# Patient Record
Sex: Male | Born: 1955 | Race: Black or African American | Hispanic: No | Smoking: Never smoker
Health system: Southern US, Community
[De-identification: ages and names within clinical notes are randomized; demographics above are authoritative.]

## PROBLEM LIST (undated history)

## (undated) DIAGNOSIS — N184 Chronic kidney disease, stage 4 (severe): Secondary | ICD-10-CM

## (undated) DIAGNOSIS — I499 Cardiac arrhythmia, unspecified: Secondary | ICD-10-CM

## (undated) DIAGNOSIS — R0989 Other specified symptoms and signs involving the circulatory and respiratory systems: Secondary | ICD-10-CM

## (undated) DIAGNOSIS — I10 Essential (primary) hypertension: Secondary | ICD-10-CM

## (undated) DIAGNOSIS — I517 Cardiomegaly: Secondary | ICD-10-CM

## (undated) DIAGNOSIS — N189 Chronic kidney disease, unspecified: Secondary | ICD-10-CM

## (undated) DIAGNOSIS — N289 Disorder of kidney and ureter, unspecified: Secondary | ICD-10-CM

## (undated) DIAGNOSIS — I110 Hypertensive heart disease with heart failure: Secondary | ICD-10-CM

## (undated) DIAGNOSIS — C801 Malignant (primary) neoplasm, unspecified: Secondary | ICD-10-CM

## (undated) DIAGNOSIS — D649 Anemia, unspecified: Secondary | ICD-10-CM

## (undated) DIAGNOSIS — I4729 Other ventricular tachycardia: Secondary | ICD-10-CM

## (undated) HISTORY — DX: Other ventricular tachycardia: I47.29

## (undated) HISTORY — PX: PROSTATE BIOPSY: SHX241

## (undated) HISTORY — DX: Hypertensive heart disease with heart failure: I11.0

## (undated) HISTORY — DX: Chronic kidney disease, stage 4 (severe): N18.4

## (undated) HISTORY — DX: Other specified symptoms and signs involving the circulatory and respiratory systems: R09.89

## (undated) HISTORY — DX: Cardiomegaly: I51.7

## (undated) HISTORY — DX: Essential (primary) hypertension: I10

## (undated) HISTORY — DX: Anemia, unspecified: D64.9

---

## 1991-07-03 DIAGNOSIS — J189 Pneumonia, unspecified organism: Secondary | ICD-10-CM

## 1991-07-03 HISTORY — DX: Pneumonia, unspecified organism: J18.9

## 1991-07-03 HISTORY — PX: OTHER SURGICAL HISTORY: SHX169

## 1997-12-24 ENCOUNTER — Ambulatory Visit (HOSPITAL_COMMUNITY): Admission: RE | Admit: 1997-12-24 | Discharge: 1997-12-24 | Payer: Self-pay | Admitting: Family Medicine

## 1998-02-21 ENCOUNTER — Ambulatory Visit (HOSPITAL_COMMUNITY): Admission: RE | Admit: 1998-02-21 | Discharge: 1998-02-21 | Payer: Self-pay | Admitting: Family Medicine

## 1998-10-05 ENCOUNTER — Ambulatory Visit (HOSPITAL_COMMUNITY): Admission: RE | Admit: 1998-10-05 | Discharge: 1998-10-05 | Payer: Self-pay | Admitting: Oncology

## 1999-12-04 ENCOUNTER — Encounter: Admission: RE | Admit: 1999-12-04 | Discharge: 1999-12-04 | Payer: Self-pay

## 2000-10-21 ENCOUNTER — Encounter: Payer: Self-pay | Admitting: *Deleted

## 2000-10-21 ENCOUNTER — Ambulatory Visit (HOSPITAL_COMMUNITY): Admission: RE | Admit: 2000-10-21 | Discharge: 2000-10-21 | Payer: Self-pay | Admitting: *Deleted

## 2000-12-16 ENCOUNTER — Ambulatory Visit (HOSPITAL_COMMUNITY): Admission: RE | Admit: 2000-12-16 | Discharge: 2000-12-16 | Payer: Self-pay | Admitting: Family Medicine

## 2000-12-16 ENCOUNTER — Encounter: Payer: Self-pay | Admitting: Family Medicine

## 2002-04-01 ENCOUNTER — Encounter: Payer: Self-pay | Admitting: Family Medicine

## 2002-04-01 ENCOUNTER — Ambulatory Visit (HOSPITAL_COMMUNITY): Admission: RE | Admit: 2002-04-01 | Discharge: 2002-04-01 | Payer: Self-pay | Admitting: Family Medicine

## 2002-04-16 ENCOUNTER — Ambulatory Visit (HOSPITAL_COMMUNITY): Admission: RE | Admit: 2002-04-16 | Discharge: 2002-04-16 | Payer: Self-pay | Admitting: Family Medicine

## 2002-04-16 ENCOUNTER — Encounter: Payer: Self-pay | Admitting: Family Medicine

## 2002-06-19 ENCOUNTER — Ambulatory Visit (HOSPITAL_COMMUNITY): Admission: RE | Admit: 2002-06-19 | Discharge: 2002-06-19 | Payer: Self-pay | Admitting: Family Medicine

## 2002-06-19 ENCOUNTER — Encounter: Payer: Self-pay | Admitting: Family Medicine

## 2003-05-03 ENCOUNTER — Ambulatory Visit (HOSPITAL_COMMUNITY): Admission: RE | Admit: 2003-05-03 | Discharge: 2003-05-03 | Payer: Self-pay | Admitting: Cardiovascular Disease

## 2003-05-21 ENCOUNTER — Ambulatory Visit (HOSPITAL_COMMUNITY): Admission: RE | Admit: 2003-05-21 | Discharge: 2003-05-21 | Payer: Self-pay | Admitting: Gastroenterology

## 2004-02-17 ENCOUNTER — Ambulatory Visit (HOSPITAL_COMMUNITY): Admission: RE | Admit: 2004-02-17 | Discharge: 2004-02-17 | Payer: Self-pay | Admitting: Gastroenterology

## 2005-10-23 ENCOUNTER — Encounter: Payer: Self-pay | Admitting: Cardiovascular Disease

## 2008-01-16 ENCOUNTER — Encounter: Payer: Self-pay | Admitting: Cardiovascular Disease

## 2008-01-23 ENCOUNTER — Inpatient Hospital Stay (HOSPITAL_BASED_OUTPATIENT_CLINIC_OR_DEPARTMENT_OTHER): Admission: RE | Admit: 2008-01-23 | Discharge: 2008-01-23 | Payer: Self-pay | Admitting: Cardiovascular Disease

## 2009-04-11 ENCOUNTER — Encounter: Admission: RE | Admit: 2009-04-11 | Discharge: 2009-04-11 | Payer: Self-pay | Admitting: Family Medicine

## 2010-11-14 NOTE — Cardiovascular Report (Signed)
NAMEINDIANA, PECHACEK                ACCOUNT NO.:  [id number]   MEDICAL RECORD NO.:  [id number]          PATIENT TYPE:  OIB   LOCATION:  1962                         FACILITY:  [facility]   PHYSICIAN:  Vesta Mixer, M.D. DATE OF BIRTH:  12/30/1955   DATE OF PROCEDURE:  01/23/2008  DATE OF DISCHARGE:                            CARDIAC CATHETERIZATION   Terry Moss is a 55 year old gentleman with a long history of  hypertension.  He has had history of nonsustained ventricular  tachycardia.  He had a stress Cardiolite study recently, which revealed  a lot of ventricular ectopy and revealed moderate left ventricular  dysfunction.  He is referred for heart catheterization for further  evaluation.   The procedure was left heart catheterization with coronary angiography.   The right femoral artery was easily cannulated using a modified  Seldinger technique.   HEMODYNAMIC:  LV pressure is 115/75 with an aortic pressure of 109/67.   Angiography left main.  The left main is smooth and normal.   The left anterior descending artery is smooth and normal.  The diagonals  are fairly normal.   The left circumflex artery is normal in its proximal segment.  There is  a mid 10-20% stenosis just after giving off the first obtuse marginal  artery.  The remainder of the left circumflex artery is normal.  The  first obtuse marginal artery is moderate in size and is normal.   The right coronary artery is moderate to large in size and is dominant.  There is a 20-30% stenosis in the proximal segment.  The remainder of  the RCA is unremarkable.  The posterior descending artery and  posterolateral segment artery are unremarkable.   The left ventriculogram was performed in the 30 RAO position.  It  reveals moderately enlarged left ventricle.  There is mild-to-moderate  left ventricular dysfunction.  The apical segments seem to contract  normally, but the base does not contract quite as vigorously.  The  ejection fraction is approximately 50%.   COMPLICATIONS:  None.   CONCLUSION:  1. Minimal coronary artery irregularities.  2. Mild left ventricular dysfunction with moderate left ventricular      enlargement.  We will continue with the medical therapy.  He may      need ACE inhibitors for further improvement.  His heart rates are      already fairly slow, and I do not think that we can add a beta-      blocker.           ______________________________  Vesta Mixer, M.D.     PJN/MEDQ  D:  01/23/2008  T:  01/24/2008  Job:  [id number]   cc:   Holley Bouche, M.D.

## 2010-11-14 NOTE — H&P (Signed)
NAMEGENTLE, HOGE                ACCOUNT NO.:  [id number]   MEDICAL RECORD NO.:  [id number]         PATIENT TYPE:  JCAR   LOCATION:                               FACILITY:  [facility]   PHYSICIAN:  Vesta Mixer, M.D. DATE OF BIRTH:  December 23, 1955   DATE OF ADMISSION:  01/23/2008  DATE OF DISCHARGE:                              HISTORY & PHYSICAL   The patient is a middle-aged gentleman with a history of hypertension,  palpitations, and nonsustained ventricular tachycardia.  He is admitted  to the hospital for elective heart catheterization after having an  abnormal Cardiolite study.   The patient has been followed for many years for mildly dilated left  ventricle.  He has also had some nonsustained ventricular tachycardia.  He has had numerous stress test.  All of which have been negative.   He was seen last week for a stress test.  He was found to have numerous  premature atrial contractions and some ventricular arrhythmias.  The  stress Cardiolite study revealed that he did have good exercise  capacity.  He had moderately enlarged left ventricle with an ejection  fraction of 40%.  Because of this sonographic abnormalities, he is  scheduled for heart catheterization.  The patient has noticed some  shortness of breath, especially with exertion.  This has occurred more  often recently.  He has never had any episodes of chest pain.  He is  still able to exercise fairly well but has noticed a distinct difference  in his abilities.   He denies any syncope or presyncope.  He denies any PND or orthopnea.   His current medications are:  1. Maxzide 37.5 mg/12.5 mg once a day.  2. Anti-aging pill once a day.   ALLERGIES:  None.   PAST MEDICAL HISTORY:  1. History of mild cardiomegaly.  2. History of hypertension.   SOCIAL HISTORY:  The patient used to smoke but quit 20 years ago.  He  does not drink alcohol.   His family history is unremarkable.   His review of systems is reviewed  and is essentially negative except for  as noted in the HPI.   On exam, he is a middle-aged gentleman in no acute distress.  He is  alert and oriented x3 and his mood and affect are normal.  His weight is  172, blood pressure 150/80 with a heart rate of 60.  HEENT exam reveals  2+ carotids.  He has no bruits, no JVD, and no thyromegaly.  Lungs are  clear to auscultation.  Heart has regular rate, S1 and S2.  Abdominal  exam reveals good bowel sounds and is nontender.  Extremities, he has no  clubbing, cyanosis, or edema.   The patient presents with some cardiomegaly and some mild congestive  heart failure.  He had lots of complex arrhythmias, although no real  consecutive ventricular beats, although it was concerning.  I would like  to scheduled him for heart catheterization.  We have discussed the  risks, benefits, and options of heart catheterization.  He understands  and agrees to proceed.  We have scheduled this for Friday, January 23, 2008.           ______________________________  Vesta Mixer, M.D.     PJN/MEDQ  D:  01/19/2008  T:  01/20/2008  Job:  [id number]   cc:   Holley Bouche, M.D.

## 2010-11-17 NOTE — Op Note (Signed)
NAMESUHAIL, PELOQUIN                          ACCOUNT NO.:  [id number]   MEDICAL RECORD NO.:  [id number]                   PATIENT TYPE:  AMB   LOCATION:  ENDO                                 FACILITY:  [facility]   PHYSICIAN:  Graylin Shiver, M.D.                DATE OF BIRTH:  1956-05-06   DATE OF PROCEDURE:  02/17/2004  DATE OF DISCHARGE:                                 OPERATIVE REPORT   PROCEDURE:  Colonoscopy.   INDICATIONS FOR PROCEDURE:  Family history of colon cancer in the patient's  brother.  Informed consent was obtained after explanation of the risks of  bleeding, infection, and perforation.   PREMEDICATION:  Fentanyl 60 mcg IV, Versed 6 mg IV.   PROCEDURE IN DETAIL:  With the patient in the left lateral decubitus  position, a rectal exam was performed and no masses were felt.  The Olympus  colonoscope was inserted into the rectum and advanced around the colon to  the cecum.  Cecal landmarks were identified.  The cecum  and ascending colon  were normal  The transverse colon was normal.  The descending colon,  sigmoid, and rectum were normal.  He tolerated the procedure well without  complications.   IMPRESSION:  Normal colonoscopy to the cecum.   PLAN:  In view of the family history of colon cancer, I would recommend a  follow up colonoscopy again in five years.                                               Graylin Shiver, M.D.    Germain Osgood  D:  02/17/2004  T:  02/17/2004  Job:  [id number]   cc:   Melida Quitter, M.D.  [address]  [place]  [place] [address]  Fax: [phone]

## 2011-12-08 ENCOUNTER — Encounter: Payer: Self-pay | Admitting: *Deleted

## 2012-05-06 DIAGNOSIS — I10 Essential (primary) hypertension: Secondary | ICD-10-CM | POA: Insufficient documentation

## 2014-04-22 ENCOUNTER — Telehealth: Payer: Self-pay | Admitting: Cardiovascular Disease

## 2014-04-22 NOTE — Telephone Encounter (Signed)
Walk in pt Form " [employer]" paper Dropped Off gave to [facility]

## 2014-04-23 ENCOUNTER — Telehealth: Payer: Self-pay | Admitting: Cardiovascular Disease

## 2014-04-23 NOTE — Telephone Encounter (Signed)
New message     Need to talk to a nurse regarding documents needing to be faxed to his job

## 2014-04-23 NOTE — Telephone Encounter (Signed)
Follow up          [phone] is the fax number to send the papers

## 2014-04-23 NOTE — Telephone Encounter (Signed)
Left message to call back  

## 2014-04-23 NOTE — Telephone Encounter (Signed)
Apparently already dropped off a form to [employer] for Dr. Elmarie Shiley signature

## 2014-04-26 NOTE — Telephone Encounter (Signed)
Spoke with patient and advised him that per Dr. Acie Fredrickson, cannot complete paperwork for his employer until seen in the office.  Patient scheduled for tomorrow at 11:45.  Patient verbalized understanding and agreement.

## 2014-04-27 ENCOUNTER — Encounter: Payer: Self-pay | Admitting: Cardiovascular Disease

## 2014-04-27 ENCOUNTER — Ambulatory Visit (INDEPENDENT_AMBULATORY_CARE_PROVIDER_SITE_OTHER): Payer: BC Managed Care – PPO | Admitting: Cardiovascular Disease

## 2014-04-27 VITALS — BP 130/70 | HR 49 | Ht 72.0 in | Wt 155.8 lb

## 2014-04-27 DIAGNOSIS — Z13220 Encounter for screening for lipoid disorders: Secondary | ICD-10-CM

## 2014-04-27 DIAGNOSIS — I5022 Chronic systolic (congestive) heart failure: Secondary | ICD-10-CM | POA: Insufficient documentation

## 2014-04-27 DIAGNOSIS — I38 Endocarditis, valve unspecified: Secondary | ICD-10-CM | POA: Insufficient documentation

## 2014-04-27 DIAGNOSIS — I493 Ventricular premature depolarization: Secondary | ICD-10-CM

## 2014-04-27 MED ORDER — POTASSIUM CHLORIDE ER 10 MEQ PO TBCR
10.0000 meq | EXTENDED_RELEASE_TABLET | Freq: Every day | ORAL | Status: DC
Start: 2014-04-27 — End: 2015-06-01

## 2014-04-27 MED ORDER — HYDROCHLOROTHIAZIDE 12.5 MG PO CAPS: 12.5000 mg | ORAL_CAPSULE | Freq: Every day | ORAL | Status: DC

## 2014-04-27 NOTE — Patient Instructions (Signed)
Your physician has recommended you make the following change in your medication:  START Hydrochlorothiazide 12.5 mg once daily START Kdur (potassium supplement) 10 meq once daily  Your physician recommends that you have lab work:  TODAY (BMET, lipids, liver, TSH)  Your physician recommends that you return for lab work in: 3 weeks to recheck kidney function (BMET)  Your physician has requested that you have an echocardiogram. Echocardiography is a painless test that uses sound waves to create images of your heart. It provides your doctor with information about the size and shape of your heart and how well your heart's chambers and valves are working. This procedure takes approximately one hour. There are no restrictions for this procedure.  Your physician wants you to follow-up in: 1 year with Dr. Acie Fredrickson.  You will receive a reminder letter in the mail two months in advance. If you don't receive a letter, please call our office to schedule the follow-up appointment.

## 2014-04-27 NOTE — Progress Notes (Signed)
     Terry Moss Date of Birth  09-Aug-1955       [facility] Office [address], [address] [place], [place], [place]  [address]   [place], [place]  [address] [address]   Fax  [phone]     Fax [phone]  Problem List: 1. Hypertension 2. Nonsustained ventricular tachycardia 3. Chronic systolic congestive heart failure-ejection fraction of 40% by stress Myoview study in 2009  History of Present Illness:  The patient is a middle-aged gentleman with a history of hypertension,  palpitations, and nonsustained ventricular tachycardia.   The patient has been followed for many years for mildly dilated left  ventricle. He has also had some nonsustained ventricular tachycardia.  He has had numerous stress test. All of which have been negative.  Oct. 27, 2015:  Terry Moss is seen today after ~6 year absence.  He was followed for years for mild systolic CHF, and NSVT.  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 No smptoms. Had several stress tests for years from 2000 to 2009 - all of which were negative.  He exercises regularly without any difficutly.  He denies any syncope or pre-syncope.    Current Outpatient Prescriptions on File Prior to Visit  Medication Sig Dispense Refill  . losartan (COZAAR) 50 MG tablet Take 100 mg by mouth daily.        No current facility-administered medications on file prior to visit.    No Known Allergies  Past Medical History  Diagnosis Date  . Anemia   . Sinus complaint   . Hypertension   . Cardiomegaly     Past Surgical History  Procedure Laterality Date  . Pleurisy  1995    Right lung surgery     History  Smoking status  . Never Smoker   Smokeless tobacco  . Not on file    History  Alcohol Use No    Family History  Problem Relation Age of Onset  . Heart disease Father     deceased  . Hypertension Mother     deceased  . Heart  disease Brother   . Heart disease Sister     x 2 sister  . Heart attack Brother     Reviw of Systems:  Reviewed in the HPI.  All other systems are negative.  Physical Exam: Blood pressure 140/90, pulse 49, height 6' (1.829 m), weight 155 lb 12.8 oz (70.67 kg). Wt Readings from Last 3 Encounters:  04/27/14 155 lb 12.8 oz (70.67 kg)     General: Well developed, well nourished, in no acute distress.  Head: Normocephalic, atraumatic, sclera non-icteric, mucus membranes are moist,   Neck: Supple. Carotids are 2 + without bruits. No JVD   Lungs: Clear   Heart: RR with frequent premature beats   Abdomen: Soft, non-tender, non-distended with normal bowel sounds.  Msk:  Strength and tone are normal   Extremities: No clubbing or cyanosis. No edema.  Distal pedal pulses are 2+ and equal    Neuro: CN II - XII intact.  Alert and oriented X 3.   Psych:  Normal   ECG: Oct. 27,2015:  Sinus brady at 49.  Occasional PVCs.  Voltage for LVH.   Assessment / Plan:

## 2014-04-27 NOTE — Assessment & Plan Note (Addendum)
Terry Moss has a hx of chronic systolic congestive heart failure. He is asymptomatic. LV is mildly dilated and he has had an LVEF of 40% for years. I've seen him in 5-6 years but he's had all these findings for many years.  He's completely asymptomatic. He works out on the right basis. He plays basketball in the gym  for hours without symptoms.   We'll get another echocardiogram for reevaluation of his LV function. We will forward this note,   and the echocardiogram as well as some laboratory data to:  [facility] and [place] [place], [place]  [address] Phone [phone] Fax [phone]

## 2014-04-27 NOTE — Assessment & Plan Note (Signed)
He has PVCs.  He has had these for years. Will recheck an echo and labwork including BMP, TSH , liver enzymes, lipids.  Will see him in 1 year.

## 2014-04-28 ENCOUNTER — Telehealth: Payer: Self-pay | Admitting: Cardiovascular Disease

## 2014-04-28 NOTE — Telephone Encounter (Signed)
Advised patient that fax was sent and confirmation received

## 2014-04-28 NOTE — Telephone Encounter (Signed)
New message      Pt saw Dr Acie Fredrickson yesterday.  [employer] did not get the fax from [place] in order for the pt to return to work.  Please refax it asap so that pt can return to work.

## 2014-04-29 ENCOUNTER — Telehealth: Payer: Self-pay | Admitting: Cardiovascular Disease

## 2014-04-29 ENCOUNTER — Encounter: Payer: Self-pay | Admitting: Nurse Practitioner

## 2014-04-29 NOTE — Telephone Encounter (Signed)
New message     Calling to let the nurse know he needs a note from Dr Acie Fredrickson stating he can work----this is for [employer].  He is coming to see you.

## 2014-04-29 NOTE — Telephone Encounter (Signed)
Patient was given letter in person in the office.

## 2014-05-17 ENCOUNTER — Other Ambulatory Visit (HOSPITAL_COMMUNITY): Payer: BC Managed Care – PPO

## 2014-05-17 ENCOUNTER — Other Ambulatory Visit (INDEPENDENT_AMBULATORY_CARE_PROVIDER_SITE_OTHER): Payer: BC Managed Care – PPO | Admitting: *Deleted

## 2014-05-17 ENCOUNTER — Other Ambulatory Visit: Payer: BC Managed Care – PPO

## 2014-05-17 ENCOUNTER — Ambulatory Visit (HOSPITAL_COMMUNITY): Payer: BC Managed Care – PPO | Attending: Cardiology

## 2014-05-17 DIAGNOSIS — I453 Trifascicular block: Secondary | ICD-10-CM | POA: Insufficient documentation

## 2014-05-17 DIAGNOSIS — I10 Essential (primary) hypertension: Secondary | ICD-10-CM | POA: Insufficient documentation

## 2014-05-17 DIAGNOSIS — I493 Ventricular premature depolarization: Secondary | ICD-10-CM

## 2014-05-17 DIAGNOSIS — I5022 Chronic systolic (congestive) heart failure: Secondary | ICD-10-CM

## 2014-05-17 LAB — BASIC METABOLIC PANEL
BUN: 29 mg/dL — ABNORMAL HIGH (ref 6–23)
CALCIUM: 9.3 mg/dL (ref 8.4–10.5)
CHLORIDE: 103 meq/L (ref 96–112)
CO2: 30 meq/L (ref 19–32)
CREATININE: 1.6 mg/dL — AB (ref 0.4–1.5)
GFR: 56.21 mL/min — AB (ref >60.00)
Glucose, Bld: 70 mg/dL (ref 70–99)
Potassium: 4.7 mEq/L (ref 3.5–5.1)
SODIUM: 139 meq/L (ref 135–145)

## 2014-05-17 NOTE — Progress Notes (Signed)
2D Echo completed. 05/17/2014

## 2014-05-18 ENCOUNTER — Telehealth: Payer: Self-pay | Admitting: Cardiovascular Disease

## 2014-05-18 MED ORDER — HYDRALAZINE HCL 25 MG PO TABS: 25.0000 mg | ORAL_TABLET | Freq: Three times a day (TID) | ORAL | Status: DC

## 2014-05-18 MED ORDER — ISOSORBIDE DINITRATE 20 MG PO TABS: 20.0000 mg | ORAL_TABLET | Freq: Three times a day (TID) | ORAL | Status: DC

## 2014-05-18 NOTE — Telephone Encounter (Signed)
Spoke with patient earlier today and reviewed echo and lab results and plan of care to start Isordil dinitrate 20 mg TID and Hydralazine 25 mg TID for heart function and to follow-up in 1 month.  Patient verbalized understanding and agreement.  Rx sent and appointment scheduled and I advised patient to call back with questions or concerns

## 2014-05-18 NOTE — Telephone Encounter (Signed)
Pt rtn call to michelle, pls call [phone]

## 2014-05-20 ENCOUNTER — Telehealth: Payer: Self-pay | Admitting: Cardiovascular Disease

## 2014-05-20 NOTE — Telephone Encounter (Signed)
Called patient back. He wanted to know if he should start both the Isordil and Hydralazine at one half a tab  TID for 2 weeks. I advised that this is correct. He is complaining of some facial flushing and headache. Advised him to take some Tylenol PRN and let us know if he is tolerating the medications. Patient verbalized understanding. Will let Dr.Nahser know that he called.

## 2014-05-20 NOTE — Telephone Encounter (Signed)
Pt taking isosorbide and hydralizine and has questions re directions, what he was told is not directions pharmacy has on bottle, pls advise [phone]

## 2014-05-24 NOTE — Telephone Encounter (Signed)
The facial flushing and headache are side effects from the isordil They should resolve after a week or so Agree with tylenol for HA

## 2014-05-25 NOTE — Telephone Encounter (Signed)
Spoke with patient who states the headaches have been terrible but so far today he does not have a headache.  Patient states he will continue to observe his s/s and call back if headaches do not get better.  I advised patient to take Tylenol as needed.  Patient verbalized understanding and agreement.

## 2014-06-03 ENCOUNTER — Encounter: Payer: Self-pay | Admitting: Cardiovascular Disease

## 2014-06-10 ENCOUNTER — Telehealth: Payer: Self-pay | Admitting: Cardiovascular Disease

## 2014-06-10 NOTE — Telephone Encounter (Signed)
New Msg  Pt calling, has tenderness in chest. Concerned its related to new meds. Please call [phone].

## 2014-06-10 NOTE — Telephone Encounter (Signed)
Left pt a message to call back. 

## 2014-06-10 NOTE — Telephone Encounter (Signed)
Pt states that he had started taking two new medications for the last 3 weeks: Isosorbide 20 mg once daily and Hydralazine 25 mg.  For the last 2 weeks pt has been having tenderness to touch on his chest on and off. Pt thinks that is one of the two new medications he is taking that is doing it. Pt is aware that this message will be send to MD for recommendations.

## 2014-06-11 NOTE — Telephone Encounter (Signed)
Spoke with patient who states he is not feeling well recently and is wondering if the medications, Hydralazine and Isordil which were started recently, are causing side effects.  Patient states he has pain in the center of his chest that is intermittent and has just started over the last couple of weeks; states it does not change with activity/exertion.  Patient denies SOB, muscle aches or fatigue.  I advised patient that I will discuss with Dr. Acie Fredrickson, who is in the office today and will call him back.  Patient verbalized understanding and agreement.

## 2014-06-11 NOTE — Telephone Encounter (Signed)
Spoke with patient and advised Dr. Acie Fredrickson would like to see him on Monday in the [place] office.  Patient states he received a call from the [place] office and has the address and information needed for appointment.  Patient thanked me for the call.

## 2014-06-14 ENCOUNTER — Ambulatory Visit: Payer: BC Managed Care – PPO | Admitting: Cardiovascular Disease

## 2014-07-05 ENCOUNTER — Ambulatory Visit (INDEPENDENT_AMBULATORY_CARE_PROVIDER_SITE_OTHER): Payer: Self-pay | Admitting: Cardiovascular Disease

## 2014-07-05 ENCOUNTER — Encounter: Payer: Self-pay | Admitting: Cardiovascular Disease

## 2014-07-05 VITALS — BP 144/90 | HR 58 | Ht 72.0 in | Wt 156.0 lb

## 2014-07-05 DIAGNOSIS — I5022 Chronic systolic (congestive) heart failure: Secondary | ICD-10-CM

## 2014-07-05 MED ORDER — FUROSEMIDE 40 MG PO TABS: 40.0000 mg | ORAL_TABLET | Freq: Every day | ORAL | Status: DC

## 2014-07-05 NOTE — Progress Notes (Signed)
Terry Moss Date of Birth  June 22, 1956       [facility] Office [address], [address], [place], [place]  [address]   [place], [place]  [address] [place]   Fax  [phone]     Fax [phone]  Problem List: 1. Hypertension 2. Nonsustained ventricular tachycardia 3. Chronic systolic congestive heart failure-ejection fraction of 40% by stress Myoview study in 2009  History of Present Illness:  The patient is a middle-aged gentleman with a history of hypertension,  palpitations, and nonsustained ventricular tachycardia.   The patient has been followed for many years for mildly dilated left  ventricle. He has also had some nonsustained ventricular tachycardia.  He has had numerous stress test. All of which have been negative.  Oct. 27, 2015:  Terry Moss is seen today after ~6 year absence.  He was followed for years for mild systolic CHF, and NSVT.  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 No smptoms. Had several stress tests for years from 2000 to 2009 - all of which were negative.  He exercises regularly without any difficutly.  He denies any syncope or pre-syncope.  Jan. 4, 2015: Terry Moss is seen back today for follow up of his CHF. He has had HTN for years.   He was seen in Oct. After a multiple year absence.  He is on the full dose of hydralazine and imdur He eats out quite a bit.  He has significant DOE after taking the hydralazine and Imdur.   Struggles while playing basketball or running.    Current Outpatient Prescriptions on File Prior to Visit  Medication Sig Dispense Refill  . hydrALAZINE (APRESOLINE) 25 MG tablet Take 1 tablet (25 mg total) by mouth 3 (three) times daily. May take 1/2 tab 3 times daily for the first 2 weeks 270 tablet 3  . hydrochlorothiazide (MICROZIDE) 12.5 MG capsule Take 1 capsule (12.5 mg total) by mouth daily. 90 capsule 3  .  isosorbide dinitrate (ISORDIL) 20 MG tablet Take 1 tablet (20 mg total) by mouth 3 (three) times daily. May take 1/2 tab 3 times daily for the first 2 weeks 270 tablet 3  . losartan (COZAAR) 50 MG tablet Take 100 mg by mouth daily.     . potassium chloride (K-DUR) 10 MEQ tablet Take 1 tablet (10 mEq total) by mouth daily. 90 tablet 3   No current facility-administered medications on file prior to visit.    No Known Allergies  Past Medical History  Diagnosis Date  . Anemia   . Sinus complaint   . Hypertension   . Cardiomegaly     Past Surgical History  Procedure Laterality Date  . Pleurisy  1995    Right lung surgery     History  Smoking status  . Never Smoker   Smokeless tobacco  . Not on file    History  Alcohol Use No    Family History  Problem Relation Age of Onset  . Heart disease Father     deceased  . Hypertension Mother     deceased  . Heart disease Brother   . Heart disease Sister     x 2 sister  . Heart attack Brother     Reviw of Systems:  Reviewed in the HPI.  All other systems are negative.  Physical Exam:  Blood pressure 144/90, pulse 58, height 6' (1.829 m), weight 156 lb (70.761 kg). Wt Readings from Last 3 Encounters:  07/05/14 156 lb (70.761 kg)  04/27/14 155 lb 12.8 oz (70.67 kg)     General: Well developed, well nourished, in no acute distress.  Head: Normocephalic, atraumatic, sclera non-icteric, mucus membranes are moist,   Neck: Supple. Carotids are 2 + without bruits. No JVD   Lungs: Clear   Heart: RR with frequent premature beats   Abdomen: Soft, non-tender, non-distended with normal bowel sounds.  Msk:  Strength and tone are normal   Extremities: No clubbing or cyanosis. No edema.  Distal pedal pulses are 2+ and equal    Neuro: CN II - XII intact.  Alert and oriented X 3.   Psych:  Normal   ECG: Oct. 27,2015:  Sinus brady at 49.  Occasional PVCs.  Voltage for LVH.   Assessment / Plan:

## 2014-07-05 NOTE — Assessment & Plan Note (Signed)
Terry Moss presents today for follow up of his CHf.  He is not tolerating the Hydralazine / Imdur very well - I suspect it is the Imdur that is causing his weakness and shortness of breath shortly after taking his meds. I think that his HCTZ is not strong enough and we will DC the HCTZ and start Lasix 40 a day I will see him in 1 months of OV and BMP. I anticipate starting Aldactone soon.  His HR is very slow.  I'm not sure if there will be any benefit in starting low dose coreg and I'm not sure if he will tolerate it.

## 2014-07-05 NOTE — Patient Instructions (Addendum)
Your physician has recommended you make the following change in your medication:  STOP HCTZ START Lasix 40 mg once daily  Your physician recommends that you schedule a follow-up appointment in: 1 month on 07/26/14 @ 4:45 pm  Your physician recommends that you return for lab work in: at next office visit - Basic Metabolic Panel

## 2014-07-09 ENCOUNTER — Ambulatory Visit: Payer: BC Managed Care – PPO | Admitting: Cardiovascular Disease

## 2014-07-26 ENCOUNTER — Encounter: Payer: Self-pay | Admitting: Cardiovascular Disease

## 2014-07-26 ENCOUNTER — Other Ambulatory Visit (INDEPENDENT_AMBULATORY_CARE_PROVIDER_SITE_OTHER): Payer: BLUE CROSS/BLUE SHIELD | Admitting: *Deleted

## 2014-07-26 ENCOUNTER — Ambulatory Visit: Payer: Self-pay | Admitting: Cardiovascular Disease

## 2014-07-26 ENCOUNTER — Ambulatory Visit (INDEPENDENT_AMBULATORY_CARE_PROVIDER_SITE_OTHER): Payer: BLUE CROSS/BLUE SHIELD | Admitting: Cardiovascular Disease

## 2014-07-26 VITALS — BP 104/52 | HR 48 | Ht 72.0 in | Wt 159.0 lb

## 2014-07-26 DIAGNOSIS — I493 Ventricular premature depolarization: Secondary | ICD-10-CM

## 2014-07-26 DIAGNOSIS — I5022 Chronic systolic (congestive) heart failure: Secondary | ICD-10-CM

## 2014-07-26 DIAGNOSIS — R0602 Shortness of breath: Secondary | ICD-10-CM

## 2014-07-26 MED ORDER — SPIRONOLACTONE 25 MG PO TABS: 12.5000 mg | ORAL_TABLET | Freq: Every day | ORAL | Status: DC

## 2014-07-26 NOTE — Progress Notes (Addendum)
Cardiology Office Note   Date:  07/26/2014   ID:  [name], DOB 1955-08-05, MRN [id number]  PCP:  Shirline Frees, MD  Cardiologist:   Thayer Headings, MD   Chief Complaint  Patient presents with  . Follow-up    chf      History of Present Illness: Terry Moss is a 59 y.o. male who presents for follow up of his CHF.  1. Hypertension 2. Nonsustained ventricular tachycardia 3. Chronic systolic congestive heart failure-ejection fraction of 40% by stress Myoview study in 2009  History of Present Illness:  The patient is a middle-aged gentleman with a history of hypertension,  palpitations, and nonsustained ventricular tachycardia.   The patient has been followed for many years for mildly dilated left  ventricle. He has also had some nonsustained ventricular tachycardia.  He has had numerous stress test. All of which have been negative.  Oct. 27, 2015:  Terry Moss is seen today after ~6 year absence.  He was followed for years for mild systolic CHF, and NSVT.  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 No smptoms. Had several stress tests for years from 2000 to 2009 - all of which were negative.  He exercises regularly without any difficutly.  He denies any syncope or pre-syncope.  Jan. 4, 20156: Terry Moss is seen back today for follow up of his CHF. He has had HTN for years. He was seen in Oct. After a multiple year absence.  He is on the full dose of hydralazine and imdur He eats out quite a bit.  He has significant DOE after taking the hydralazine and Imdur. Struggles while playing basketball or running  Jan. 25, 2016:  BP is much better. May be slightly too low at times.  Has symptoms of orthostasis on occasion.    Past Medical History  Diagnosis Date  . Anemia   . Sinus complaint   . Hypertension   . Cardiomegaly     Past Surgical History  Procedure Laterality Date  . Pleurisy  1995    Right lung surgery       Current Outpatient Prescriptions  Medication Sig Dispense Refill  . furosemide (LASIX) 40 MG tablet Take 1 tablet (40 mg total) by mouth daily. 90 tablet 3  . hydrALAZINE (APRESOLINE) 25 MG tablet Take 1 tablet (25 mg total) by mouth 3 (three) times daily. May take 1/2 tab 3 times daily for the first 2 weeks 270 tablet 3  . isosorbide dinitrate (ISORDIL) 20 MG tablet Take 1 tablet (20 mg total) by mouth 3 (three) times daily. May take 1/2 tab 3 times daily for the first 2 weeks 270 tablet 3  . losartan (COZAAR) 50 MG tablet Take 100 mg by mouth daily.     . potassium chloride (K-DUR) 10 MEQ tablet Take 1 tablet (10 mEq total) by mouth daily. 90 tablet 3   No current facility-administered medications for this visit.    Allergies:   Review of patient's allergies indicates no known allergies.    Social History:  The patient  reports that he has never smoked. He does not have any smokeless tobacco history on file. He reports that he does not drink alcohol or use illicit drugs.   Family History:  The patient's family history includes Heart attack in his brother; Heart disease in his brother, father, and sister; Hypertension in his mother.    ROS:  Please see the history  of present illness.   Otherwise, review of systems are positive for none.   All other systems are reviewed and negative.    PHYSICAL EXAM: VS:  BP 104/52 mmHg  Pulse 48  Ht 6' (1.829 m)  Wt 159 lb (72.122 kg)  BMI 21.56 kg/m2 , BMI Body mass index is 21.56 kg/(m^2). GEN: Well nourished, well developed, in no acute distress HEENT: normal Neck: no JVD, carotid bruits, or masses Cardiac: RRR; no murmurs, rubs, or gallops,no edema ,  Occasional prematures beats  Respiratory:  clear to auscultation bilaterally, normal work of breathing GI: soft, nontender, nondistended, + BS MS: no deformity or atrophy Skin: warm and dry, no rash Neuro:  Strength and sensation are intact Psych: normal   EKG:  EKG is not ordered  today.   Recent Labs: 05/17/2014: BUN 29*; Creatinine 1.6*; Potassium 4.7; Sodium 139    Lipid Panel No results found for: CHOL, TRIG, HDL, CHOLHDL, VLDL, LDLCALC, LDLDIRECT    Wt Readings from Last 3 Encounters:  07/26/14 159 lb (72.122 kg)  07/05/14 156 lb (70.761 kg)  04/27/14 155 lb 12.8 oz (70.67 kg)      Other studies Reviewed: Additional studies/ records that were reviewed today include: . Review of the above records demonstrates:    ASSESSMENT AND PLAN:  1. Chronic systolic CHF:   His BP is better on this medical regimin. currentlly on: ISDN 20 TID Hydralazine 25 TID Losartan 50 a day Lasix 40 a day  Kdur 10 meq a day.  BMP and  BNP were drawn today.   Will add spirinolactone 12. 5 a day. He will return in 1 week for nurse visit / BP check and BMP ( since we added spirinolactone)   2.  LBBB - stable    Current medicines are reviewed at length with the patient today.  The patient does not have concerns regarding medicines.  The following changes have been made:  Added spirinolactone 12. 5 a day   Labs/ tests ordered today include: BMP, No orders of the defined types were placed in this encounter.     Disposition:   FU with me in 2 months for office and echo.     Signed, Terry Moss, Terry Cheng, MD  07/26/2014 4:50 PM    [facility] [place], [place], [place]  [address] Phone: [phone]; Fax: [phone]   Addendum:    His echo show  Persistent severe left ventricular dysfunction despite near maximal medical therapy. I think that we need to proceed with cardiac catheterization .     At this time. We discussed the risks, benefits, and options of cardiac Physician. He understands and agrees to proceed.

## 2014-07-26 NOTE — Patient Instructions (Addendum)
**Note De-Identified Terry Moss Obfuscation** Your physician has recommended you make the following change in your medication: start taking Spironolactone 12.5 mg daily  Your physician recommends that you return for lab work in: today  Your physician has requested that you have an echocardiogram. Echocardiography is a painless test that uses sound waves to create images of your heart. It provides your doctor with information about the size and shape of your heart and how well your heart's chambers and valves are working. This procedure takes approximately one hour. There are no restrictions for this procedure.  Your physician recommends that you return for lab work in: 1 week (BMET)  Dr Acie Fredrickson recommends that you have a blood pressure check with nurse on same day as lab work in 1 week  Your physician recommends that you schedule a follow-up appointment in: 2 months

## 2014-07-27 LAB — BRAIN NATRIURETIC PEPTIDE: PRO B NATRI PEPTIDE: 36 pg/mL (ref 0.0–100.0)

## 2014-07-27 LAB — BASIC METABOLIC PANEL
BUN: 37 mg/dL — ABNORMAL HIGH (ref 6–23)
CALCIUM: 9.5 mg/dL (ref 8.4–10.5)
CHLORIDE: 102 meq/L (ref 96–112)
CO2: 29 mEq/L (ref 19–32)
CREATININE: 1.89 mg/dL — AB (ref 0.40–1.50)
GFR: 47.02 mL/min — AB (ref >60.00)
Glucose, Bld: 86 mg/dL (ref 70–99)
POTASSIUM: 4.5 meq/L (ref 3.5–5.1)
SODIUM: 137 meq/L (ref 135–145)

## 2014-08-03 ENCOUNTER — Other Ambulatory Visit (INDEPENDENT_AMBULATORY_CARE_PROVIDER_SITE_OTHER): Payer: BLUE CROSS/BLUE SHIELD | Admitting: *Deleted

## 2014-08-03 ENCOUNTER — Telehealth: Payer: Self-pay | Admitting: *Deleted

## 2014-08-03 ENCOUNTER — Ambulatory Visit (HOSPITAL_COMMUNITY): Payer: BLUE CROSS/BLUE SHIELD | Attending: Cardiology

## 2014-08-03 ENCOUNTER — Ambulatory Visit (INDEPENDENT_AMBULATORY_CARE_PROVIDER_SITE_OTHER): Payer: BLUE CROSS/BLUE SHIELD | Admitting: *Deleted

## 2014-08-03 VITALS — BP 120/62 | HR 41 | Wt 158.4 lb

## 2014-08-03 DIAGNOSIS — I5022 Chronic systolic (congestive) heart failure: Secondary | ICD-10-CM

## 2014-08-03 DIAGNOSIS — I10 Essential (primary) hypertension: Secondary | ICD-10-CM

## 2014-08-03 DIAGNOSIS — I493 Ventricular premature depolarization: Secondary | ICD-10-CM

## 2014-08-03 LAB — BASIC METABOLIC PANEL
BUN: 33 mg/dL — AB (ref 6–23)
CHLORIDE: 101 meq/L (ref 96–112)
CO2: 30 meq/L (ref 19–32)
Calcium: 9.4 mg/dL (ref 8.4–10.5)
Creatinine, Ser: 1.94 mg/dL — ABNORMAL HIGH (ref 0.40–1.50)
GFR: 45.62 mL/min — AB (ref >60.00)
Glucose, Bld: 91 mg/dL (ref 70–99)
Potassium: 4.6 mEq/L (ref 3.5–5.1)
SODIUM: 135 meq/L (ref 135–145)

## 2014-08-03 NOTE — Progress Notes (Signed)
Patient in today for BP check, lab work and ECHO. BP today = 133/71, HR 43 sitting and 120/62, HR 41 standing. Denies current complaints, problems but states that sometimes he does get "slightly lightheaded if I stand up really fast, but it goes right away". Routed to Dr. Acie Fredrickson. Patient escorted to ECHO room 2.

## 2014-08-03 NOTE — Addendum Note (Signed)
Addended by: Eulis Foster on: 08/03/2014 11:13 AM   Modules accepted: Orders

## 2014-08-03 NOTE — Telephone Encounter (Signed)
Patient notified of lab results. Encouraged patient to stay hydrated/drink water. Cautioned against drinking sodas, etc. Patient stated he is trying to get enough water in every day. Patient will repeat labs in 3 weeks (approximately 08/23/2014, as he is off on Monday's). Patient verbalized agreement with current treatment plan.

## 2014-08-03 NOTE — Progress Notes (Signed)
2D Echo completed. 08/03/2014

## 2014-08-04 ENCOUNTER — Telehealth: Payer: Self-pay | Admitting: Cardiovascular Disease

## 2014-08-04 ENCOUNTER — Encounter: Payer: Self-pay | Admitting: Nurse Practitioner

## 2014-08-04 ENCOUNTER — Ambulatory Visit: Payer: BC Managed Care – PPO | Admitting: Cardiovascular Disease

## 2014-08-04 DIAGNOSIS — I5022 Chronic systolic (congestive) heart failure: Secondary | ICD-10-CM

## 2014-08-04 NOTE — Telephone Encounter (Signed)
-----   Message from Thayer Headings, MD sent at 08/04/2014 11:26 AM EST ----- His EF has not improved much. I think he needs a right and left cath. He will need a referral to EP to consider ICD

## 2014-08-04 NOTE — Telephone Encounter (Signed)
New Message  Pt returning Michelle's phone call; please call back and discuss.

## 2014-08-04 NOTE — Telephone Encounter (Signed)
Reviewed echo results and plan of care with patient who verbalized understanding and agreement.  Patient scheduled for right & left heart cath on Monday 2/8 with Dr. Tamala Julian.  Lab appointment for pre-cath labs on Friday 2/5.  I reviewed pre-procedure instructions with patient on telephone and advised patient I will leave a copy for him at the front desk for pick up when he comes in for lab work on Friday.  I advised patient to call back with questions or concerns. Patient verbalized understanding and agreement.

## 2014-08-06 ENCOUNTER — Other Ambulatory Visit (INDEPENDENT_AMBULATORY_CARE_PROVIDER_SITE_OTHER): Payer: BLUE CROSS/BLUE SHIELD | Admitting: *Deleted

## 2014-08-06 ENCOUNTER — Other Ambulatory Visit: Payer: Self-pay | Admitting: Cardiovascular Disease

## 2014-08-06 DIAGNOSIS — I5022 Chronic systolic (congestive) heart failure: Secondary | ICD-10-CM

## 2014-08-06 LAB — CBC WITH DIFFERENTIAL/PLATELET
Basophils Absolute: 0 10*3/uL (ref 0.0–0.1)
Basophils Relative: 1 % (ref 0–1)
Eosinophils Absolute: 0 10*3/uL (ref 0.0–0.7)
Eosinophils Relative: 1 % (ref 0–5)
HEMATOCRIT: 34.5 % — AB (ref 39.0–52.0)
HEMOGLOBIN: 11.5 g/dL — AB (ref 13.0–17.0)
Lymphocytes Relative: 33 % (ref 12–46)
Lymphs Abs: 1.3 10*3/uL (ref 0.7–4.0)
MCH: 28.9 pg (ref 26.0–34.0)
MCHC: 33.3 g/dL (ref 30.0–36.0)
MCV: 86.7 fL (ref 78.0–100.0)
MONO ABS: 0.4 10*3/uL (ref 0.1–1.0)
MONOS PCT: 10 % (ref 3–12)
MPV: 10.9 fL (ref 8.6–12.4)
NEUTROS ABS: 2.1 10*3/uL (ref 1.7–7.7)
NEUTROS PCT: 55 % (ref 43–77)
PLATELETS: 173 10*3/uL (ref 150–400)
RBC: 3.98 MIL/uL — AB (ref 4.22–5.81)
RDW: 14.2 % (ref 11.5–15.5)
WBC: 3.9 10*3/uL — AB (ref 4.0–10.5)

## 2014-08-06 LAB — PROTIME-INR
INR: 1.08 (ref <1.50)
Prothrombin Time: 14 seconds (ref 11.6–15.2)

## 2014-08-09 ENCOUNTER — Ambulatory Visit (HOSPITAL_COMMUNITY)
Admission: RE | Admit: 2014-08-09 | Discharge: 2014-08-09 | Disposition: A | Discharge status: Home / Self Care | Payer: BLUE CROSS/BLUE SHIELD | Source: Ambulatory Visit | Attending: Interventional Cardiology | Admitting: Interventional Cardiology

## 2014-08-09 ENCOUNTER — Other Ambulatory Visit: Payer: Self-pay | Admitting: Cardiovascular Disease

## 2014-08-09 DIAGNOSIS — I429 Cardiomyopathy, unspecified: Secondary | ICD-10-CM | POA: Diagnosis not present

## 2014-08-09 DIAGNOSIS — I447 Left bundle-branch block, unspecified: Secondary | ICD-10-CM | POA: Diagnosis not present

## 2014-08-09 DIAGNOSIS — I10 Essential (primary) hypertension: Secondary | ICD-10-CM | POA: Insufficient documentation

## 2014-08-09 DIAGNOSIS — I5022 Chronic systolic (congestive) heart failure: Secondary | ICD-10-CM | POA: Diagnosis not present

## 2014-08-09 DIAGNOSIS — Z538 Procedure and treatment not carried out for other reasons: Secondary | ICD-10-CM | POA: Diagnosis not present

## 2014-08-09 LAB — BASIC METABOLIC PANEL
Anion gap: 5 (ref 5–15)
BUN: 29 mg/dL — AB (ref 6–23)
CALCIUM: 9.3 mg/dL (ref 8.4–10.5)
CHLORIDE: 105 mmol/L (ref 96–112)
CO2: 27 mmol/L (ref 19–32)
Creatinine, Ser: 2.15 mg/dL — ABNORMAL HIGH (ref 0.50–1.35)
GFR calc Af Amer: 37 mL/min — ABNORMAL LOW (ref >90)
GFR calc non Af Amer: 32 mL/min — ABNORMAL LOW (ref >90)
GLUCOSE: 98 mg/dL (ref 70–99)
POTASSIUM: 4.2 mmol/L (ref 3.5–5.1)
SODIUM: 137 mmol/L (ref 135–145)

## 2014-08-09 MED ORDER — SODIUM CHLORIDE 0.9 % IJ SOLN: 3.0000 mL | INTRAMUSCULAR | Status: DC | PRN

## 2014-08-09 MED ORDER — ASPIRIN 81 MG PO CHEW
81.0000 mg | CHEWABLE_TABLET | Freq: Once | ORAL | Status: AC
  Administered 2014-08-09: 81 mg via ORAL

## 2014-08-09 MED ORDER — SODIUM CHLORIDE 0.9 % IV SOLN
INTRAVENOUS | Status: DC
  Administered 2014-08-09: 10:00:00 via INTRAVENOUS

## 2014-08-09 MED ORDER — ASPIRIN 81 MG PO CHEW: 81.0000 mg | CHEWABLE_TABLET | ORAL | Status: DC

## 2014-08-09 MED ORDER — SODIUM CHLORIDE 0.9 % IV SOLN: 250.0000 mL | INTRAVENOUS | Status: DC | PRN

## 2014-08-09 MED ORDER — ASPIRIN 81 MG PO CHEW
CHEWABLE_TABLET | ORAL | Status: AC
  Filled 2014-08-09: qty 1

## 2014-08-09 MED ORDER — SODIUM CHLORIDE 0.9 % IJ SOLN: 3.0000 mL | Freq: Two times a day (BID) | INTRAMUSCULAR | Status: DC

## 2014-08-09 NOTE — Interval H&P Note (Signed)
Cath Lab Visit (complete for each Cath Lab visit)  Clinical Evaluation Leading to the Procedure:   ACS: No.  Non-ACS:    Anginal Classification: CCS II  Anti-ischemic medical therapy: Minimal Therapy (1 class of medications)  Non-Invasive Test Results: No non-invasive testing performed  Prior CABG: No previous CABG      History and Physical Interval Note:  08/09/2014 8:48 AM  Terry Moss  has presented today for surgery, with the diagnosis of Cardiomyopathy  The various methods of treatment have been discussed with the patient and family. After consideration of risks, benefits and other options for treatment, the patient has consented to  Procedure(s): LEFT AND RIGHT HEART CATHETERIZATION WITH CORONARY ANGIOGRAM (N/A) as a surgical intervention .  The patient's history has been reviewed, patient examined, no change in status, stable for surgery.  I have reviewed the patient's chart and labs.  Questions were answered to the patient's satisfaction.     Sinclair Grooms

## 2014-08-09 NOTE — Progress Notes (Signed)
Spoke with Rennis Harding about the creatinine level.  She is to tell Dr. Tamala Julian about this.

## 2014-08-09 NOTE — Progress Notes (Signed)
Notified Dr Tamala Julian that patient Cr today is 2.15.  Instructed by Dr Tamala Julian to cancel procedure today, bring patient back in on Wednesday Feb 10th to do heart catheterization.  Dr Tamala Julian tell patient to hold Lasix, Losartan and Aldactone until after procedure.  [place] for patient to leave today.

## 2014-08-09 NOTE — H&P (View-Only) (Signed)
Cardiology Office Note   Date:  07/26/2014   ID:  Terry Moss, DOB 01-31-1956, MRN [id number]  PCP:  Shirline Frees, MD  Cardiologist:   Thayer Headings, MD   Chief Complaint  Patient presents with  . Follow-up    chf      History of Present Illness: Terry Moss is a 59 y.o. male who presents for follow up of his CHF.  1. Hypertension 2. Nonsustained ventricular tachycardia 3. Chronic systolic congestive heart failure-ejection fraction of 40% by stress Myoview study in 2009  History of Present Illness:  The patient is a middle-aged gentleman with a history of hypertension,  palpitations, and nonsustained ventricular tachycardia.   The patient has been followed for many years for mildly dilated left  ventricle. He has also had some nonsustained ventricular tachycardia.  He has had numerous stress test. All of which have been negative.  Oct. 27, 2015:  Moss is seen today after ~6 year absence.  He was followed for years for mild systolic CHF, and NSVT.  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 No smptoms. Had several stress tests for years from 2000 to 2009 - all of which were negative.  He exercises regularly without any difficutly.  He denies any syncope or pre-syncope.  Jan. 4, 20156: Terry is seen back today for follow up of his CHF. He has had HTN for years. He was seen in Oct. After a multiple year absence.  He is on the full dose of hydralazine and imdur He eats out quite a bit.  He has significant DOE after taking the hydralazine and Imdur. Struggles while playing basketball or running  Jan. 25, 2016:  BP is much better. May be slightly too low at times.  Has symptoms of orthostasis on occasion.    Past Medical History  Diagnosis Date  . Anemia   . Sinus complaint   . Hypertension   . Cardiomegaly     Past Surgical History  Procedure Laterality Date  . Pleurisy  1995    Right lung surgery       Current Outpatient Prescriptions  Medication Sig Dispense Refill  . furosemide (LASIX) 40 MG tablet Take 1 tablet (40 mg total) by mouth daily. 90 tablet 3  . hydrALAZINE (APRESOLINE) 25 MG tablet Take 1 tablet (25 mg total) by mouth 3 (three) times daily. May take 1/2 tab 3 times daily for the first 2 weeks 270 tablet 3  . isosorbide dinitrate (ISORDIL) 20 MG tablet Take 1 tablet (20 mg total) by mouth 3 (three) times daily. May take 1/2 tab 3 times daily for the first 2 weeks 270 tablet 3  . losartan (COZAAR) 50 MG tablet Take 100 mg by mouth daily.     . potassium chloride (K-DUR) 10 MEQ tablet Take 1 tablet (10 mEq total) by mouth daily. 90 tablet 3   No current facility-administered medications for this visit.    Allergies:   Review of patient's allergies indicates no known allergies.    Social History:  The patient  reports that he has never smoked. He does not have any smokeless tobacco history on file. He reports that he does not drink alcohol or use illicit drugs.   Family History:  The patient's family history includes Heart attack in his brother; Heart disease in his brother, father, and sister; Hypertension in his mother.    ROS:  Please see the history  of present illness.   Otherwise, review of systems are positive for none.   All other systems are reviewed and negative.    PHYSICAL EXAM: VS:  BP 104/52 mmHg  Pulse 48  Ht 6' (1.829 m)  Wt 159 lb (72.122 kg)  BMI 21.56 kg/m2 , BMI Body mass index is 21.56 kg/(m^2). GEN: Well nourished, well developed, in no acute distress HEENT: normal Neck: no JVD, carotid bruits, or masses Cardiac: RRR; no murmurs, rubs, or gallops,no edema ,  Occasional prematures beats  Respiratory:  clear to auscultation bilaterally, normal work of breathing GI: soft, nontender, nondistended, + BS MS: no deformity or atrophy Skin: warm and dry, no rash Neuro:  Strength and sensation are intact Psych: normal   EKG:  EKG is not ordered  today.   Recent Labs: 05/17/2014: BUN 29*; Creatinine 1.6*; Potassium 4.7; Sodium 139    Lipid Panel No results found for: CHOL, TRIG, HDL, CHOLHDL, VLDL, LDLCALC, LDLDIRECT    Wt Readings from Last 3 Encounters:  07/26/14 159 lb (72.122 kg)  07/05/14 156 lb (70.761 kg)  04/27/14 155 lb 12.8 oz (70.67 kg)      Other studies Reviewed: Additional studies/ records that were reviewed today include: . Review of the above records demonstrates:    ASSESSMENT AND PLAN:  1. Chronic systolic CHF:   His BP is better on this medical regimin. currentlly on: ISDN 20 TID Hydralazine 25 TID Losartan 50 a day Lasix 40 a day  Kdur 10 meq a day.  BMP and  BNP were drawn today.   Will add spirinolactone 12. 5 a day. He will return in 1 week for nurse visit / BP check and BMP ( since we added spirinolactone)   2.  LBBB - stable    Current medicines are reviewed at length with the patient today.  The patient does not have concerns regarding medicines.  The following changes have been made:  Added spirinolactone 12. 5 a day   Labs/ tests ordered today include: BMP, No orders of the defined types were placed in this encounter.     Disposition:   FU with me in 2 months for office and echo.     Signed, Danzel Marszalek, Wonda Cheng, MD  07/26/2014 4:50 PM    [facility] [place], [place], [place]  [address] Phone: [phone]; Fax: [phone]   Addendum:    His echo show  Persistent severe left ventricular dysfunction despite near maximal medical therapy. I think that we need to proceed with cardiac catheterization .     At this time. We discussed the risks, benefits, and options of cardiac Physician. He understands and agrees to proceed.

## 2014-08-11 ENCOUNTER — Telehealth: Payer: Self-pay | Admitting: Nurse Practitioner

## 2014-08-11 ENCOUNTER — Ambulatory Visit (HOSPITAL_COMMUNITY): Admit: 2014-08-11 | Payer: Self-pay | Admitting: Interventional Cardiology

## 2014-08-11 ENCOUNTER — Encounter (HOSPITAL_COMMUNITY): Payer: Self-pay

## 2014-08-11 SURGERY — LEFT AND RIGHT HEART CATHETERIZATION WITH CORONARY ANGIOGRAM
Anesthesia: LOCAL

## 2014-08-11 NOTE — Telephone Encounter (Signed)
Patient called this morning to report that he will have to cancel heart cath due to insurance change.  Patient reports that his responsibility for the procedure is much higher than with previous insurance plan and he needs to reschedule for after the 1st of March.  I advised patient that I would notify the [facility] Cath lab and Dr. Acie Fredrickson and would call him back with Dr. Elmarie Shiley advice.  I advised patient that per Dr. Acie Fredrickson he is to resume regular doses of medications and stay well hydrated.  I have asked patient to call me after the 1st of March to schedule a date for the cath.  I advised patient that per Dr. Acie Fredrickson he will have to come in for a quick office visit and lab work prior to cath appointment.  I advised him to call back with questions or concerns.  Patient verbalized understanding and agreement with plan of care.

## 2014-08-24 ENCOUNTER — Telehealth: Payer: Self-pay | Admitting: Cardiovascular Disease

## 2014-08-24 NOTE — Telephone Encounter (Signed)
Reviewed meds with pt who is aware to continue on medications as listed.  He is to keep appt as scheduled.

## 2014-08-24 NOTE — Telephone Encounter (Signed)
New message     Pt c/o medication issue:  1. Name of Medication: spironolactone 25 mg   2. How are you currently taking this medication (dosage and times per day)? 1/2 pill a day   3. Are you having a reaction (difficulty breathing--STAT)? No   4. What is your medication issue? Only 2 dosage left. Should he continue with medication or discontinue.

## 2014-09-20 ENCOUNTER — Ambulatory Visit (INDEPENDENT_AMBULATORY_CARE_PROVIDER_SITE_OTHER): Payer: 59 | Admitting: Cardiovascular Disease

## 2014-09-20 ENCOUNTER — Encounter: Payer: Self-pay | Admitting: Cardiovascular Disease

## 2014-09-20 VITALS — BP 118/78 | HR 57 | Ht 72.0 in | Wt 162.4 lb

## 2014-09-20 DIAGNOSIS — I5022 Chronic systolic (congestive) heart failure: Secondary | ICD-10-CM

## 2014-09-20 LAB — BASIC METABOLIC PANEL
BUN: 35 mg/dL — ABNORMAL HIGH (ref 6–23)
CALCIUM: 9.2 mg/dL (ref 8.4–10.5)
CO2: 30 meq/L (ref 19–32)
CREATININE: 1.98 mg/dL — AB (ref 0.40–1.50)
Chloride: 102 mEq/L (ref 96–112)
GFR: 44.54 mL/min — ABNORMAL LOW (ref >60.00)
GLUCOSE: 83 mg/dL (ref 70–99)
Potassium: 4.5 mEq/L (ref 3.5–5.1)
SODIUM: 135 meq/L (ref 135–145)

## 2014-09-20 NOTE — Progress Notes (Signed)
Cardiology Office Note   Date:  09/20/2014   ID:  Terry Moss, Terry Moss 20-Feb-1956, MRN [id number]  PCP:  Shirline Frees, MD  Cardiologist:   Thayer Headings, MD   No chief complaint on file.  Problem List: 1. Hypertension 2. Nonsustained ventricular tachycardia 3. Chronic systolic congestive heart failure-ejection fraction of 40% by stress Myoview study in 2009  EF 25-30% by echo 2016.   History of Present Illness:  The patient is a middle-aged gentleman with a history of hypertension,  palpitations, and nonsustained ventricular tachycardia.   The patient has been followed for many years for mildly dilated left  ventricle. He has also had some nonsustained ventricular tachycardia.  He has had numerous stress test. All of which have been negative.  Oct. 27, 2015:  Terry Moss is seen today after ~6 year absence.  He was followed for years for mild systolic CHF, and NSVT.  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 No smptoms. Had several stress tests for years from 2000 to 2009 - all of which were negative.  He exercises regularly without any difficutly.  He denies any syncope or pre-syncope.  Jan. 4, 2016: Terry Moss is seen back today for follow up of his CHF. He has had HTN for years. He was seen in Oct. After a multiple year absence.  He is on the full dose of hydralazine and imdur He eats out quite a bit.  He has significant DOE after taking the hydralazine and Imdur. Struggles while playing basketball or running  Jan. 25, 2016:  BP is much better. May be slightly too low at times. Has symptoms of orthostasis on occasion.     September 20, 2014:   Terry Moss is a 59 y.o. male who presents for follow up of his CHF. We had arranged for him to have a cardiac cath but it was cancelled due to a bump in his creatinine.  He has known CKD.  Breathing is ok Still runs on a regular basis.   Past Medical History  Diagnosis Date  .  Anemia   . Sinus complaint   . Hypertension   . Cardiomegaly     Past Surgical History  Procedure Laterality Date  . Pleurisy  1995    Right lung surgery      Current Outpatient Prescriptions  Medication Sig Dispense Refill  . furosemide (LASIX) 40 MG tablet Take 1 tablet (40 mg total) by mouth daily. 90 tablet 3  . hydrALAZINE (APRESOLINE) 25 MG tablet Take 1 tablet (25 mg total) by mouth 3 (three) times daily. May take 1/2 tab 3 times daily for the first 2 weeks (Patient taking differently: Take 25 mg by mouth 3 (three) times daily. ) 270 tablet 3  . isosorbide dinitrate (ISORDIL) 20 MG tablet Take 1 tablet (20 mg total) by mouth 3 (three) times daily. May take 1/2 tab 3 times daily for the first 2 weeks (Patient taking differently: Take 20 mg by mouth 3 (three) times daily. ) 270 tablet 3  . potassium chloride (K-DUR) 10 MEQ tablet Take 1 tablet (10 mEq total) by mouth daily. 90 tablet 3  . spironolactone (ALDACTONE) 25 MG tablet Take 0.5 tablets (12.5 mg total) by mouth daily. 15 tablet 3   No current facility-administered medications for this visit.    Allergies:   Review of patient's allergies indicates no known allergies.    Social History:  The patient  reports that he has never smoked. He does not have any smokeless tobacco history on file. He reports that he does not drink alcohol or use illicit drugs.   Family History:  The patient's family history includes Heart attack in his brother; Heart disease in his brother, father, and sister; Hypertension in his mother.    ROS:  Please see the history of present illness.    Review of Systems: Constitutional:  denies fever, chills, diaphoresis, appetite change and fatigue.  HEENT: denies photophobia, eye pain, redness, hearing loss, ear pain, congestion, sore throat, rhinorrhea, sneezing, neck pain, neck stiffness and tinnitus.  Respiratory: admits to SOB, DOE, cough, chest tightness, and wheezing.  Cardiovascular: denies  chest pain, palpitations and leg swelling.  Gastrointestinal: denies nausea, vomiting, abdominal pain, diarrhea, constipation, blood in stool.  Genitourinary: denies dysuria, urgency, frequency, hematuria, flank pain and difficulty urinating.  Musculoskeletal: denies  myalgias, back pain, joint swelling, arthralgias and gait problem.   Skin: denies pallor, rash and wound.  Neurological: denies dizziness, seizures, syncope, weakness, light-headedness, numbness and headaches.   Hematological: denies adenopathy, easy bruising, personal or family bleeding history.  Psychiatric/ Behavioral: denies suicidal ideation, mood changes, confusion, nervousness, sleep disturbance and agitation.       All other systems are reviewed and negative.    PHYSICAL EXAM: VS:  BP 118/78 mmHg  Pulse 57  Ht 6' (1.829 m)  Wt 162 lb 6.4 oz (73.664 kg)  BMI 22.02 kg/m2  SpO2 98% , BMI Body mass index is 22.02 kg/(m^2). GEN: Well nourished, well developed, in no acute distress HEENT: normal Neck: no JVD, carotid bruits, or masses Cardiac: RRR; frequent premature beats .  no murmurs, rubs, or gallops,no edema  Respiratory:  clear to auscultation bilaterally, normal work of breathing GI: soft, nontender, nondistended, + BS MS: no deformity or atrophy Skin: warm and dry, no rash Neuro:  Strength and sensation are intact Psych: normal   EKG:  EKG is not ordered today.    Recent Labs: 07/26/2014: Pro B Natriuretic peptide (BNP) 36.0 08/06/2014: Hemoglobin 11.5*; Platelets 173 08/09/2014: BUN 29*; Creatinine 2.15*; Potassium 4.2; Sodium 137    Lipid Panel No results found for: CHOL, TRIG, HDL, CHOLHDL, VLDL, LDLCALC, LDLDIRECT    Wt Readings from Last 3 Encounters:  09/20/14 162 lb 6.4 oz (73.664 kg)  08/09/14 162 lb (73.483 kg)  08/03/14 158 lb 6.4 oz (71.85 kg)      Other studies Reviewed: Additional studies/ records that were reviewed today include: . Review of the above records demonstrates:     ASSESSMENT AND PLAN:  1. Hypertension -  stable. 2. Nonsustained ventricular tachycardia 3. Chronic systolic congestive heart failure-ejection fraction of 40% by stress Myoview study in 2009  EF 25-30% by echo 2016.  His LV function has unfortunately remained low. He's now on isosorbide as well as hydralazine. Because of his renal dysfunction, we will stop the losartan. We'll check a basic metabolic profile today. Will check again in 3 weeks. Once his renal function has improved to his baseline, we'll schedule him for a right and left heart catheterization.   Current medicines are reviewed at length with the patient today.  The patient does not have concerns regarding medicines.  The following changes have been made:  See above.   Labs/ tests ordered today include: BMP    Orders Placed This Encounter  Procedures  . Basic Metabolic Panel (BMET)  . Basic Metabolic Panel (BMET)     Disposition:   FU with  me in 6 weeks.     Signed, Falesha Schommer, Wonda Cheng, MD  09/20/2014 2:54 PM    [facility] [place], [place], [place]  [address] Phone: [phone]; Fax: [phone]

## 2014-09-20 NOTE — Patient Instructions (Signed)
Your physician has recommended you make the following change in your medication:  STOP Losartan  Your physician recommends that you have lab work:  TODAY - basic metabolic panel  Your physician recommends that you return for lab work in: 3 weeks for basic metabolic panel  Your physician recommends that you schedule a follow-up appointment in: 6 weeks with Dr. Acie Fredrickson

## 2014-09-21 ENCOUNTER — Other Ambulatory Visit: Payer: Self-pay | Admitting: Family Medicine

## 2014-09-21 ENCOUNTER — Ambulatory Visit
Admission: RE | Admit: 2014-09-21 | Discharge: 2014-09-21 | Disposition: A | Discharge status: Home / Self Care | Payer: 59 | Source: Ambulatory Visit | Attending: Family Medicine | Admitting: Family Medicine

## 2014-09-21 DIAGNOSIS — M549 Dorsalgia, unspecified: Secondary | ICD-10-CM

## 2014-09-21 DIAGNOSIS — R1031 Right lower quadrant pain: Secondary | ICD-10-CM

## 2014-10-11 ENCOUNTER — Other Ambulatory Visit: Payer: 59

## 2014-10-13 ENCOUNTER — Other Ambulatory Visit (INDEPENDENT_AMBULATORY_CARE_PROVIDER_SITE_OTHER): Payer: 59 | Admitting: *Deleted

## 2014-10-13 DIAGNOSIS — I5022 Chronic systolic (congestive) heart failure: Secondary | ICD-10-CM

## 2014-10-13 LAB — BASIC METABOLIC PANEL
BUN: 29 mg/dL — ABNORMAL HIGH (ref 6–23)
CO2: 32 mEq/L (ref 19–32)
Calcium: 9.6 mg/dL (ref 8.4–10.5)
Chloride: 100 mEq/L (ref 96–112)
Creatinine, Ser: 1.94 mg/dL — ABNORMAL HIGH (ref 0.40–1.50)
GFR: 45.59 mL/min — AB (ref >60.00)
Glucose, Bld: 39 mg/dL — CL (ref 70–99)
POTASSIUM: 4.4 meq/L (ref 3.5–5.1)
SODIUM: 138 meq/L (ref 135–145)

## 2014-10-23 ENCOUNTER — Other Ambulatory Visit: Payer: Self-pay | Admitting: Cardiovascular Disease

## 2014-11-08 ENCOUNTER — Encounter: Payer: Self-pay | Admitting: Cardiovascular Disease

## 2014-11-08 ENCOUNTER — Other Ambulatory Visit: Payer: Self-pay | Admitting: Cardiovascular Disease

## 2014-11-08 ENCOUNTER — Ambulatory Visit (INDEPENDENT_AMBULATORY_CARE_PROVIDER_SITE_OTHER): Payer: 59 | Admitting: Cardiovascular Disease

## 2014-11-08 VITALS — BP 110/76 | HR 45 | Ht 72.0 in | Wt 164.8 lb

## 2014-11-08 DIAGNOSIS — I5022 Chronic systolic (congestive) heart failure: Secondary | ICD-10-CM | POA: Diagnosis not present

## 2014-11-08 DIAGNOSIS — I493 Ventricular premature depolarization: Secondary | ICD-10-CM

## 2014-11-08 NOTE — Progress Notes (Signed)
Cardiology Office Note   Date:  11/08/2014   ID:  Terry Moss, DOB 07-09-1955, MRN [id number]  PCP:  Shirline Frees, MD  Cardiologist:   Thayer Headings, MD   Chief Complaint  Patient presents with  . Congestive Heart Failure   Problem List: 1. Hypertension 2. Nonsustained ventricular tachycardia 3. Chronic systolic congestive heart failure-ejection fraction of 40% by stress Myoview study in 2009  EF 25-30% by echo 2016.   History of Present Illness:  The patient is a middle-aged gentleman with a history of hypertension,  palpitations, and nonsustained ventricular tachycardia.   The patient has been followed for many years for mildly dilated left  ventricle. He has also had some nonsustained ventricular tachycardia.  He has had numerous stress test. All of which have been negative.  Oct. 27, 2015:  Welcome is seen today after ~6 year absence.  He was followed for years for mild systolic CHF, and NSVT.  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 No smptoms. Had several stress tests for years from 2000 to 2009 - all of which were negative.  He exercises regularly without any difficutly.  He denies any syncope or pre-syncope.  Jan. 4, 2016: [name] is seen back today for follow up of his CHF. He has had HTN for years. He was seen in Oct. After a multiple year absence.  He is on the full dose of hydralazine and imdur He eats out quite a bit.  He has significant DOE after taking the hydralazine and Imdur. Struggles while playing basketball or running  Jan. 25, 2016:  BP is much better. May be slightly too low at times. Has symptoms of orthostasis on occasion.     September 20, 2014:   [name] is a 59 y.o. male who presents for follow up of his CHF. We had arranged for him to have a cardiac cath but it was cancelled due to a bump in his creatinine.  He has known CKD.  Breathing is ok Still runs on a regular basis.  Nov 08, 2014: [name] continues to do very well. He still exercises on a regular basis. We originally had scheduled him for cardiac cath but canceled as of a creatinine pedis creatinine is still around 2.0. He denies any chest pain. His breathing is okay.   Past Medical History  Diagnosis Date  . Anemia   . Sinus complaint   . Hypertension   . Cardiomegaly     Past Surgical History  Procedure Laterality Date  . Pleurisy  1995    Right lung surgery      Current Outpatient Prescriptions  Medication Sig Dispense Refill  . furosemide (LASIX) 40 MG tablet Take 1 tablet (40 mg total) by mouth daily. 90 tablet 3  . hydrALAZINE (APRESOLINE) 25 MG tablet Take 1 tablet (25 mg total) by mouth 3 (three) times daily. May take 1/2 tab 3 times daily for the first 2 weeks (Patient taking differently: Take 25 mg by mouth 3 (three) times daily. ) 270 tablet 3  . isosorbide dinitrate (ISORDIL) 20 MG tablet Take 1 tablet (20 mg total) by mouth 3 (three) times daily. May take 1/2 tab 3 times daily for the first 2 weeks (Patient taking differently: Take 20 mg by mouth 3 (three) times daily. ) 270 tablet 3  . potassium chloride (K-DUR) 10 MEQ tablet Take 1 tablet (10 mEq total) by mouth daily. Burna  tablet 3  . spironolactone (ALDACTONE) 25 MG tablet TAKE 1/2 TABLET EVERY DAY 15 tablet 11   No current facility-administered medications for this visit.    Allergies:   Review of patient's allergies indicates no known allergies.    Social History:  The patient  reports that he has never smoked. He does not have any smokeless tobacco history on file. He reports that he does not drink alcohol or use illicit drugs.   Family History:  The patient's family history includes Heart attack in his brother; Heart disease in his brother, father, and sister; Hypertension in his mother.    ROS:  Please see the history of present illness.    Review of Systems: Constitutional:  denies fever, chills, diaphoresis, appetite  change and fatigue.  HEENT: denies photophobia, eye pain, redness, hearing loss, ear pain, congestion, sore throat, rhinorrhea, sneezing, neck pain, neck stiffness and tinnitus.  Respiratory: admits to SOB, DOE, cough, chest tightness, and wheezing.  Cardiovascular: denies chest pain, palpitations and leg swelling.  Gastrointestinal: denies nausea, vomiting, abdominal pain, diarrhea, constipation, blood in stool.  Genitourinary: denies dysuria, urgency, frequency, hematuria, flank pain and difficulty urinating.  Musculoskeletal: denies  myalgias, back pain, joint swelling, arthralgias and gait problem.   Skin: denies pallor, rash and wound.  Neurological: denies dizziness, seizures, syncope, weakness, light-headedness, numbness and headaches.   Hematological: denies adenopathy, easy bruising, personal or family bleeding history.  Psychiatric/ Behavioral: denies suicidal ideation, mood changes, confusion, nervousness, sleep disturbance and agitation.       All other systems are reviewed and negative.    PHYSICAL EXAM: VS:  BP 110/76 mmHg  Pulse 45  Ht 6' (1.829 m)  Wt 164 lb 12.8 oz (74.753 kg)  BMI 22.35 kg/m2 , BMI Body mass index is 22.35 kg/(m^2). GEN: Well nourished, well developed, in no acute distress HEENT: normal Neck: no JVD, carotid bruits, or masses Cardiac: RRR; frequent premature beats .  no murmurs, rubs, or gallops,no edema  Respiratory:  clear to auscultation bilaterally, normal work of breathing GI: soft, nontender, nondistended, + BS MS: no deformity or atrophy Skin: warm and dry, no rash Neuro:  Strength and sensation are intact Psych: normal   EKG:  EKG is not ordered today.    Recent Labs: 07/26/2014: Pro B Natriuretic peptide (BNP) 36.0 08/06/2014: Hemoglobin 11.5*; Platelets 173 10/13/2014: BUN 29*; Creatinine 1.94*; Potassium 4.4; Sodium 138    Lipid Panel No results found for: CHOL, TRIG, HDL, CHOLHDL, VLDL, LDLCALC, LDLDIRECT    Wt Readings from  Last 3 Encounters:  11/08/14 164 lb 12.8 oz (74.753 kg)  09/20/14 162 lb 6.4 oz (73.664 kg)  08/09/14 162 lb (73.483 kg)      Other studies Reviewed: Additional studies/ records that were reviewed today include: . Review of the above records demonstrates:    ASSESSMENT AND PLAN:  1. Hypertension -  stable. 2. Nonsustained ventricular tachycardia 3. Chronic systolic congestive heart failure-ejection fraction of 40% by stress Myoview study in 2009  EF 25-30% by echo 2016.  His LV function has unfortunately remained low. He's now on isosorbide as well as hydralazine. Because of his renal dysfunction, I'm concerned about doing a cath.  Will schedule an exercise myoview  I will see in 3 months .     Current medicines are reviewed at length with the patient today.  The patient does not have concerns regarding medicines.  The following changes have been made:  See above.   Labs/ tests ordered today include:  BMP    No orders of the defined types were placed in this encounter.     Disposition:   FU with me in 6 weeks.     Signed, Daanya Lanphier, Wonda Cheng, MD  11/08/2014 4:45 PM    [facility] [address], [place], [place]  [address] Phone: [phone]; Fax: [phone]

## 2014-11-08 NOTE — Patient Instructions (Addendum)
Medication Instructions:  Your physician recommends that you continue on your current medications as directed. Please refer to the Current Medication list given to you today.   Labwork: None ordered   Testing/Procedures: Your physician has requested that you have an exercise stress myoview. For further information please visit [email or web address]. Please follow instruction sheet, as given.  Your physician has requested that you have an echocardiogram. Echocardiography is a painless test that uses sound waves to create images of your heart. It provides your doctor with information about the size and shape of your heart and how well your heart's chambers and valves are working. This procedure takes approximately one hour. There are no restrictions for this procedure.   Follow-Up: Your physician recommends that you schedule a follow-up appointment in: 3 months with Dr. Vilinda Boehringer have been referred to EP  - Drs. Lorenz Coaster or Allred

## 2014-11-18 ENCOUNTER — Telehealth (HOSPITAL_COMMUNITY): Payer: Self-pay | Admitting: *Deleted

## 2014-11-18 NOTE — Telephone Encounter (Signed)
Left message on voicemail in reference to upcoming appointment scheduled for 11/22/14. Phone number given for a call back so details instructions can be given. Crissie Figures, RN

## 2014-11-22 ENCOUNTER — Ambulatory Visit (HOSPITAL_BASED_OUTPATIENT_CLINIC_OR_DEPARTMENT_OTHER): Payer: 59

## 2014-11-22 ENCOUNTER — Other Ambulatory Visit: Payer: Self-pay

## 2014-11-22 ENCOUNTER — Ambulatory Visit (HOSPITAL_COMMUNITY): Payer: 59 | Attending: Cardiovascular Disease

## 2014-11-22 DIAGNOSIS — I10 Essential (primary) hypertension: Secondary | ICD-10-CM | POA: Insufficient documentation

## 2014-11-22 DIAGNOSIS — I5022 Chronic systolic (congestive) heart failure: Secondary | ICD-10-CM

## 2014-11-22 LAB — MYOCARDIAL PERFUSION IMAGING
CHL CUP NUCLEAR SDS: 1
CHL CUP RESTING HR STRESS: 59 {beats}/min
CHL CUP STRESS STAGE 1 DBP: 98 mmHg
CHL CUP STRESS STAGE 1 SBP: 160 mmHg
CHL CUP STRESS STAGE 10 DBP: 67 mmHg
CHL CUP STRESS STAGE 10 GRADE: 0 %
CHL CUP STRESS STAGE 10 HR: 76 {beats}/min
CHL CUP STRESS STAGE 2 GRADE: 0 %
CHL CUP STRESS STAGE 2 SBP: 149 mmHg
CHL CUP STRESS STAGE 3 HR: 61 {beats}/min
CHL CUP STRESS STAGE 4 HR: 57 {beats}/min
CHL CUP STRESS STAGE 4 SPEED: 0.7 mph
CHL CUP STRESS STAGE 5 DBP: 90 mmHg
CHL CUP STRESS STAGE 5 HR: 73 {beats}/min
CHL CUP STRESS STAGE 5 SBP: 172 mmHg
CHL CUP STRESS STAGE 6 GRADE: 12 %
CHL CUP STRESS STAGE 6 HR: 95 {beats}/min
CHL CUP STRESS STAGE 6 SBP: 190 mmHg
CHL CUP STRESS STAGE 7 DBP: 72 mmHg
CHL CUP STRESS STAGE 8 DBP: 76 mmHg
CHL CUP STRESS STAGE 8 HR: 150 {beats}/min
CHL CUP STRESS STAGE 8 SBP: 208 mmHg
CHL CUP STRESS STAGE 9 GRADE: 0 %
CHL CUP STRESS STAGE 9 HR: 127 {beats}/min
CHL RATE OF PERCEIVED EXERTION: 16
CSEPED: 11 min
CSEPPBP: 208 mmHg
Estimated workload: 13.4 METS
Exercise duration (sec): 14 s
LHR: 0.27
MPHR: 160 {beats}/min
Peak HR: 150 {beats}/min
Percent HR: 100 %
Percent of predicted max HR: 93 %
SRS: 2
SSS: 3
Stage 1 Grade: 0 %
Stage 1 HR: 50 {beats}/min
Stage 1 Speed: 0 mph
Stage 10 SBP: 135 mmHg
Stage 10 Speed: 0 mph
Stage 2 DBP: 103 mmHg
Stage 2 HR: 61 {beats}/min
Stage 2 Speed: 0 mph
Stage 3 Grade: 0 %
Stage 3 Speed: 0.7 mph
Stage 4 Grade: 0 %
Stage 5 Grade: 10 %
Stage 5 Speed: 1.7 mph
Stage 6 DBP: 76 mmHg
Stage 6 Speed: 2.5 mph
Stage 7 Grade: 14 %
Stage 7 HR: 123 {beats}/min
Stage 7 SBP: 214 mmHg
Stage 7 Speed: 3.4 mph
Stage 8 Grade: 16 %
Stage 8 Speed: 4.2 mph
Stage 9 DBP: 70 mmHg
Stage 9 SBP: 173 mmHg
Stage 9 Speed: 0 mph
TID: 1

## 2014-11-22 MED ORDER — TECHNETIUM TC 99M SESTAMIBI GENERIC - CARDIOLITE
33.0000 | Freq: Once | INTRAVENOUS | Status: AC | PRN
  Administered 2014-11-22: 33 via INTRAVENOUS

## 2014-11-22 MED ORDER — TECHNETIUM TC 99M SESTAMIBI GENERIC - CARDIOLITE
11.0000 | Freq: Once | INTRAVENOUS | Status: AC | PRN
  Administered 2014-11-22: 11 via INTRAVENOUS

## 2014-12-06 ENCOUNTER — Ambulatory Visit: Payer: 59 | Admitting: Internal Medicine

## 2015-02-07 ENCOUNTER — Ambulatory Visit: Payer: 59 | Admitting: Cardiovascular Disease

## 2015-03-15 ENCOUNTER — Encounter: Payer: Self-pay | Admitting: Cardiovascular Disease

## 2015-03-15 ENCOUNTER — Ambulatory Visit (INDEPENDENT_AMBULATORY_CARE_PROVIDER_SITE_OTHER): Payer: 59 | Admitting: Cardiovascular Disease

## 2015-03-15 VITALS — BP 150/70 | HR 45 | Ht 72.0 in | Wt 165.0 lb

## 2015-03-15 DIAGNOSIS — I5022 Chronic systolic (congestive) heart failure: Secondary | ICD-10-CM

## 2015-03-15 DIAGNOSIS — I493 Ventricular premature depolarization: Secondary | ICD-10-CM | POA: Diagnosis not present

## 2015-03-15 DIAGNOSIS — I10 Essential (primary) hypertension: Secondary | ICD-10-CM

## 2015-03-15 LAB — BASIC METABOLIC PANEL
BUN: 34 mg/dL — AB (ref 6–23)
CALCIUM: 9.8 mg/dL (ref 8.4–10.5)
CHLORIDE: 100 meq/L (ref 96–112)
CO2: 32 meq/L (ref 19–32)
Creatinine, Ser: 2.12 mg/dL — ABNORMAL HIGH (ref 0.40–1.50)
GFR: 41.09 mL/min — ABNORMAL LOW (ref >60.00)
Glucose, Bld: 80 mg/dL (ref 70–99)
Potassium: 5 mEq/L (ref 3.5–5.1)
Sodium: 137 mEq/L (ref 135–145)

## 2015-03-15 MED ORDER — HYDRALAZINE HCL 50 MG PO TABS: 50.0000 mg | ORAL_TABLET | Freq: Three times a day (TID) | ORAL | Status: DC

## 2015-03-15 NOTE — Progress Notes (Signed)
Cardiology Office Note   Date:  03/15/2015   ID:  Terry Moss, DOB 08/20/1955, MRN [id number]  PCP:  Shirline Frees, MD  Cardiologist:   Thayer Headings, MD   Chief Complaint  Patient presents with  . Irregular Heart Beat   Problem List: 1. Hypertension 2. Nonsustained ventricular tachycardia 3. Chronic systolic congestive heart failure-ejection fraction of 40% by stress Myoview study in 2009  EF 25-30% by echo 2016.   History of Present Illness:  The patient is a middle-aged gentleman with a history of hypertension,  palpitations, and nonsustained ventricular tachycardia.   The patient has been followed for many years for mildly dilated left  ventricle. He has also had some nonsustained ventricular tachycardia.  He has had numerous stress test. All of which have been negative.  Oct. 27, 2015:  Terry Moss is seen today after ~6 year absence.  He was followed for years for mild systolic CHF, and NSVT.  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 No smptoms. Had several stress tests for years from 2000 to 2009 - all of which were negative.  He exercises regularly without any difficutly.  He denies any syncope or pre-syncope.  Jan. 4, 2016: Terry Moss is seen back today for follow up of his CHF. He has had HTN for years. He was seen in Oct. After a multiple year absence.  He is on the full dose of hydralazine and imdur He eats out quite a bit.  He has significant DOE after taking the hydralazine and Imdur. Struggles while playing basketball or running  Jan. 25, 2016:  BP is much better. May be slightly too low at times. Has symptoms of orthostasis on occasion.     September 20, 2014:   Terry Moss is a 59 y.o. male who presents for follow up of his CHF. We had arranged for him to have a cardiac cath but it was cancelled due to a bump in his creatinine.  He has known CKD.  Breathing is ok Still runs on a regular basis.  Nov 08, 2014: Terry Moss continues to do very well. He still exercises on a regular basis. We originally had scheduled him for cardiac cath but canceled as of a creatinine pedis creatinine is still around 2.0. He denies any chest pain. His breathing is okay.   Sept. 13, 2016:   Terry Moss is doing ok Has chronic systolic CHF.  Did not have his cath due to CKD Still very active, plays basketball regularly without any symptoms .    Past Medical History  Diagnosis Date  . Anemia   . Sinus complaint   . Hypertension   . Cardiomegaly     Past Surgical History  Procedure Laterality Date  . Pleurisy  1995    Right lung surgery      Current Outpatient Prescriptions  Medication Sig Dispense Refill  . furosemide (LASIX) 40 MG tablet Take 1 tablet (40 mg total) by mouth daily. 90 tablet 3  . hydrALAZINE (APRESOLINE) 25 MG tablet Take 1 tablet (25 mg total) by mouth 3 (three) times daily. May take 1/2 tab 3 times daily for the first 2 weeks (Patient taking differently: Take 25 mg by mouth 3 (three) times daily. ) 270 tablet 3  . isosorbide dinitrate (ISORDIL) 20 MG tablet Take 1 tablet (20 mg total) by mouth 3 (three) times daily. May take 1/2 tab 3 times daily for the first 2  weeks (Patient taking differently: Take 20 mg by mouth 3 (three) times daily. ) 270 tablet 3  . potassium chloride (K-DUR) 10 MEQ tablet Take 1 tablet (10 mEq total) by mouth daily. 90 tablet 3  . spironolactone (ALDACTONE) 25 MG tablet TAKE 1/2 TABLET EVERY DAY 15 tablet 11   No current facility-administered medications for this visit.    Allergies:   Review of patient's allergies indicates no known allergies.    Social History:  The patient  reports that he has never smoked. He does not have any smokeless tobacco history on file. He reports that he does not drink alcohol or use illicit drugs.   Family History:  The patient's family history includes Heart attack in his brother; Heart disease in his brother, father, and sister;  Hypertension in his mother; Stroke in his brother, father, and mother.    ROS:  Please see the history of present illness.    Review of Systems: Constitutional:  denies fever, chills, diaphoresis, appetite change and fatigue.  HEENT: denies photophobia, eye pain, redness, hearing loss, ear pain, congestion, sore throat, rhinorrhea, sneezing, neck pain, neck stiffness and tinnitus.  Respiratory: admits to SOB, DOE, cough, chest tightness, and wheezing.  Cardiovascular: denies chest pain, palpitations and leg swelling.  Gastrointestinal: denies nausea, vomiting, abdominal pain, diarrhea, constipation, blood in stool.  Genitourinary: denies dysuria, urgency, frequency, hematuria, flank pain and difficulty urinating.  Musculoskeletal: denies  myalgias, back pain, joint swelling, arthralgias and gait problem.   Skin: denies pallor, rash and wound.  Neurological: denies dizziness, seizures, syncope, weakness, light-headedness, numbness and headaches.   Hematological: denies adenopathy, easy bruising, personal or family bleeding history.  Psychiatric/ Behavioral: denies suicidal ideation, mood changes, confusion, nervousness, sleep disturbance and agitation.       All other systems are reviewed and negative.    PHYSICAL EXAM: VS:  BP 150/70 mmHg  Pulse 45  Ht 6' (1.829 m)  Wt 74.844 kg (165 lb)  BMI 22.37 kg/m2 , BMI Body mass index is 22.37 kg/(m^2). GEN: Well nourished, well developed, in no acute distress HEENT: normal Neck: no JVD, carotid bruits, or masses Cardiac: RRR; frequent premature beats .  no murmurs, rubs, or gallops,no edema  Respiratory:  clear to auscultation bilaterally, normal work of breathing GI: soft, nontender, nondistended, + BS MS: no deformity or atrophy Skin: warm and dry, no rash Neuro:  Strength and sensation are intact Psych: normal   EKG:  EKG is not ordered today.    Recent Labs: 07/26/2014: Pro B Natriuretic peptide (BNP) 36.0 08/06/2014:  Hemoglobin 11.5*; Platelets 173 10/13/2014: BUN 29*; Creatinine, Ser 1.94*; Potassium 4.4; Sodium 138    Lipid Panel No results found for: CHOL, TRIG, HDL, CHOLHDL, VLDL, LDLCALC, LDLDIRECT    Wt Readings from Last 3 Encounters:  03/15/15 74.844 kg (165 lb)  11/22/14 72.576 kg (160 lb)  11/08/14 74.753 kg (164 lb 12.8 oz)      Other studies Reviewed: Additional studies/ records that were reviewed today include: . Review of the above records demonstrates:    ASSESSMENT AND PLAN:  1. Hypertension -    Will increase hydralazine to 50 TID .   2. Nonsustained ventricular tachycardia  3. Chronic systolic congestive heart failure-ejection fraction of 40% by stress Myoview study in 2009  EF 25-30% by echo 2016.  His LV function has unfortunately remained low. He's now on isosorbide as well as hydralazine.  Will increase his hydralazine to 50 TID .  He jis completely asymptomatic.  No signs or symptoms of ischemia.  i suspect his chronic systolic CHF is due to his HTN.    I will see in 3 months .     Current medicines are reviewed at length with the patient today.  The patient does not have concerns regarding medicines.  The following changes have been made:  See above.   Labs/ tests ordered today include: BMP    No orders of the defined types were placed in this encounter.     Disposition:   FU with me in 3 months  Signed, Nahser, Wonda Cheng, MD  03/15/2015 2:24 PM    [facility] [address], [place], [place]  [address] Phone: [phone]; Fax: [phone]

## 2015-03-15 NOTE — Patient Instructions (Signed)
Medication Instructions:  INCREASE Hydralazine to 50 mg three times per day   Labwork: TODAY - basic metabolic panel   Testing/Procedures: None Ordered   Follow-Up: Your physician recommends that you schedule a follow-up appointment in:  3 months with Dr. Acie Fredrickson

## 2015-05-31 ENCOUNTER — Encounter: Payer: Self-pay | Admitting: Cardiovascular Disease

## 2015-06-01 ENCOUNTER — Other Ambulatory Visit: Payer: Self-pay

## 2015-06-01 DIAGNOSIS — I493 Ventricular premature depolarization: Secondary | ICD-10-CM

## 2015-06-01 DIAGNOSIS — I5022 Chronic systolic (congestive) heart failure: Secondary | ICD-10-CM

## 2015-06-01 MED ORDER — POTASSIUM CHLORIDE ER 10 MEQ PO TBCR: 10.0000 meq | EXTENDED_RELEASE_TABLET | Freq: Every day | ORAL | Status: DC

## 2015-06-07 ENCOUNTER — Encounter: Payer: Self-pay | Admitting: Cardiovascular Disease

## 2015-06-07 ENCOUNTER — Ambulatory Visit (INDEPENDENT_AMBULATORY_CARE_PROVIDER_SITE_OTHER): Payer: BLUE CROSS/BLUE SHIELD | Admitting: Cardiovascular Disease

## 2015-06-07 VITALS — BP 126/70 | HR 68 | Ht 72.0 in | Wt 164.8 lb

## 2015-06-07 DIAGNOSIS — I10 Essential (primary) hypertension: Secondary | ICD-10-CM

## 2015-06-07 DIAGNOSIS — I5022 Chronic systolic (congestive) heart failure: Secondary | ICD-10-CM | POA: Diagnosis not present

## 2015-06-07 DIAGNOSIS — I1A0 Resistant hypertension: Secondary | ICD-10-CM | POA: Insufficient documentation

## 2015-06-07 DIAGNOSIS — I5042 Chronic combined systolic (congestive) and diastolic (congestive) heart failure: Secondary | ICD-10-CM | POA: Diagnosis not present

## 2015-06-07 LAB — BASIC METABOLIC PANEL
BUN: 29 mg/dL — ABNORMAL HIGH (ref 7–25)
CALCIUM: 9.1 mg/dL (ref 8.6–10.3)
CO2: 28 mmol/L (ref 20–31)
CREATININE: 1.99 mg/dL — AB (ref 0.70–1.25)
Chloride: 101 mmol/L (ref 98–110)
GLUCOSE: 80 mg/dL (ref 65–99)
Potassium: 4.4 mmol/L (ref 3.5–5.3)
Sodium: 140 mmol/L (ref 135–146)

## 2015-06-07 NOTE — Progress Notes (Signed)
Cardiology Office Note   Date:  06/07/2015   ID:  Terry Moss, DOB 1955-10-07, MRN [id number]  PCP:  Shirline Frees, MD  Cardiologist:   Thayer Headings, MD   Chief Complaint  Patient presents with  . Follow-up    CHF   Problem List: 1. Hypertension 2. Nonsustained ventricular tachycardia 3. Chronic systolic congestive heart failure-ejection fraction of 40% by stress Myoview study in 2009  EF 25-30% by echo 2016.   History of Present Illness:  The patient is a middle-aged gentleman with a history of hypertension,  palpitations, and nonsustained ventricular tachycardia.   The patient has been followed for many years for mildly dilated left  ventricle. He has also had some nonsustained ventricular tachycardia.  He has had numerous stress test. All of which have been negative.  Oct. 27, 2015:  Terry Moss is seen today after ~6 year absence.  He was followed for years for mild systolic CHF, and NSVT.  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 No smptoms. Had several stress tests for years from 2000 to 2009 - all of which were negative.  He exercises regularly without any difficutly.  He denies any syncope or pre-syncope.  Jan. 4, 2016: Terry Moss is seen back today for follow up of his CHF. He has had HTN for years. He was seen in Oct. After a multiple year absence.  He is on the full dose of hydralazine and imdur He eats out quite a bit.  He has significant DOE after taking the hydralazine and Imdur. Struggles while playing basketball or running  Jan. 25, 2016:  BP is much better. May be slightly too low at times. Has symptoms of orthostasis on occasion.     September 20, 2014:   Terry Moss is a 59 y.o. male who presents for follow up of his CHF. We had arranged for him to have a cardiac cath but it was cancelled due to a bump in his creatinine.  He has known CKD.  Breathing is ok Still runs on a regular basis.  Nov 08, 2014: Terry Moss continues to do very well. He still exercises on a regular basis. We originally had scheduled him for cardiac cath but canceled as of a creatinine pedis creatinine is still around 2.0. He denies any chest pain. His breathing is okay.   Sept. 13, 2016:   Terry Moss is doing ok Has chronic systolic CHF.  Did not have his cath due to CKD Still very active, plays basketball regularly without any symptoms .   Dec. 6, 2016:  Terry Moss has some chest pain  - center of his chest , radiated to the right side.  Felt more muscular after several hours.  Now goes into his back  Takes his BP at home.  Typically 110s- 125 range / 70-80s.    Past Medical History  Diagnosis Date  . Anemia   . Sinus complaint   . Hypertension   . Cardiomegaly     Past Surgical History  Procedure Laterality Date  . Pleurisy  1995    Right lung surgery      Current Outpatient Prescriptions  Medication Sig Dispense Refill  . furosemide (LASIX) 40 MG tablet Take 1 tablet (40 mg total) by mouth daily. 90 tablet 3  . hydrALAZINE (APRESOLINE) 50 MG tablet Take 1 tablet (50 mg total) by mouth 3 (three) times daily. 270 tablet 3  . isosorbide dinitrate (ISORDIL)  20 MG tablet Take 1 tablet (20 mg total) by mouth 3 (three) times daily. May take 1/2 tab 3 times daily for the first 2 weeks (Patient taking differently: Take 20 mg by mouth 3 (three) times daily. ) 270 tablet 3  . potassium chloride (K-DUR) 10 MEQ tablet Take 1 tablet (10 mEq total) by mouth daily. 90 tablet 3  . spironolactone (ALDACTONE) 25 MG tablet TAKE 1/2 TABLET EVERY DAY 15 tablet 11  . tamsulosin (FLOMAX) 0.4 MG CAPS capsule Take 0.4 mg by mouth daily.  6   No current facility-administered medications for this visit.    Allergies:   Review of patient's allergies indicates no known allergies.    Social History:  The patient  reports that he has never smoked. He does not have any smokeless tobacco history on file. He reports that he does  not drink alcohol or use illicit drugs.   Family History:  The patient's family history includes Heart attack in his brother; Heart disease in his brother, father, and sister; Hypertension in his mother; Stroke in his brother, father, and mother.    ROS:  Please see the history of present illness.    Review of Systems: Constitutional:  denies fever, chills, diaphoresis, appetite change and fatigue.  HEENT: denies photophobia, eye pain, redness, hearing loss, ear pain, congestion, sore throat, rhinorrhea, sneezing, neck pain, neck stiffness and tinnitus.  Respiratory: admits to SOB, DOE, cough, chest tightness, and wheezing.  Cardiovascular: denies chest pain, palpitations and leg swelling.  Gastrointestinal: denies nausea, vomiting, abdominal pain, diarrhea, constipation, blood in stool.  Genitourinary: denies dysuria, urgency, frequency, hematuria, flank pain and difficulty urinating.  Musculoskeletal: denies  myalgias, back pain, joint swelling, arthralgias and gait problem.   Skin: denies pallor, rash and wound.  Neurological: denies dizziness, seizures, syncope, weakness, light-headedness, numbness and headaches.   Hematological: denies adenopathy, easy bruising, personal or family bleeding history.  Psychiatric/ Behavioral: denies suicidal ideation, mood changes, confusion, nervousness, sleep disturbance and agitation.       All other systems are reviewed and negative.    PHYSICAL EXAM: VS:  BP 126/70 mmHg  Pulse 68  Ht 6' (1.829 m)  Wt 164 lb 12.8 oz (74.753 kg)  BMI 22.35 kg/m2 , BMI Body mass index is 22.35 kg/(m^2). GEN: Well nourished, well developed, in no acute distress HEENT: normal Neck: no JVD, carotid bruits, or masses Cardiac: RRR; frequent premature beats .  no murmurs, rubs, or gallops,no edema  Respiratory:  clear to auscultation bilaterally, normal work of breathing GI: soft, nontender, nondistended, + BS MS: no deformity or atrophy Skin: warm and dry, no  rash Neuro:  Strength and sensation are intact Psych: normal   EKG:  EKG is ordered today.  Normal sinus rhythm with occasional premature ventricular contractions. His heart rate is 68. He has left ventricular hypertrophy with repolarization abnormalities.    Recent Labs: 07/26/2014: Pro B Natriuretic peptide (BNP) 36.0 08/06/2014: Hemoglobin 11.5*; Platelets 173 03/15/2015: BUN 34*; Creatinine, Ser 2.12*; Potassium 5.0; Sodium 137    Lipid Panel No results found for: CHOL, TRIG, HDL, CHOLHDL, VLDL, LDLCALC, LDLDIRECT    Wt Readings from Last 3 Encounters:  06/07/15 164 lb 12.8 oz (74.753 kg)  03/15/15 165 lb (74.844 kg)  11/22/14 160 lb (72.576 kg)      Other studies Reviewed: Additional studies/ records that were reviewed today include: . Review of the above records demonstrates:    ASSESSMENT AND PLAN:  1. Hypertension -    his  blood pressures currently well-controlled. Continue current medications. .   2. Nonsustained ventricular tachycardia  3. Chronic combined systolic and diastolic congestive heart failure-ejection fraction of 40% by stress Myoview study in 2009  EF 25-30% by echo 2016.   His left ventricular systolic function has improved and his EF is now 45-50%.   He does have grade 2 diastolic dysfunction. We will continue with his current medications. His blood pressure is well-controlled.  Current medicines are reviewed at length with the patient today.  The patient does not have concerns regarding medicines.  The following changes have been made:  See above.   Labs/ tests ordered today include: BMP    Orders Placed This Encounter  Procedures  . Basic Metabolic Panel (BMET)  . Comp Met (CMET)  . Lipid Profile  . EKG 12-Lead     Disposition:   FU with me in 6 months  Signed, Keli Buehner, Wonda Cheng, MD  06/07/2015 2:38 PM    [facility], [place], [place]  [address] Phone: [phone]; Fax: [phone]

## 2015-06-07 NOTE — Patient Instructions (Signed)
Medication Instructions:  Your physician recommends that you continue on your current medications as directed. Please refer to the Current Medication list given to you today.   Labwork: TODAY - basic metabolic panel  Your physician recommends that you return for lab work in: 6 months on the day of or a few days before your office visit with Dr. Nahser.  You will need to FAST for this appointment - nothing to eat or drink after midnight the night before except water.   Testing/Procedures: None Ordered   Follow-Up: Your physician wants you to follow-up in: 6 months with Dr. Nahser.  You will receive a reminder letter in the mail two months in advance. If you don't receive a letter, please call our office to schedule the follow-up appointment.   If you need a refill on your cardiac medications before your next appointment, please call your pharmacy.   Thank you for choosing [facility]! Michelle Swinyer, RN [phone]    

## 2015-06-30 ENCOUNTER — Other Ambulatory Visit: Payer: Self-pay

## 2015-06-30 MED ORDER — ISOSORBIDE DINITRATE 20 MG PO TABS: 20.0000 mg | ORAL_TABLET | Freq: Three times a day (TID) | ORAL | Status: DC

## 2015-06-30 NOTE — Telephone Encounter (Signed)
Thayer Headings, MD at 06/07/2015 2:22 PM  isosorbide dinitrate (ISORDIL) 20 MG tablet Take 1 tablet (20 mg total) by mouth 3 (three) times daily. May take 1/2 tab 3 times daily for the first 2 weeks (Patient taking differently: Take 20 mg by mouth 3 (three) times daily. ) Current medicines are reviewed at length with the patient today. The patient does not have concerns regarding medicines.  The following changes have been made: See above

## 2015-07-08 ENCOUNTER — Other Ambulatory Visit: Payer: Self-pay | Admitting: *Deleted

## 2015-07-08 DIAGNOSIS — I5022 Chronic systolic (congestive) heart failure: Secondary | ICD-10-CM

## 2015-07-08 MED ORDER — FUROSEMIDE 40 MG PO TABS: 40.0000 mg | ORAL_TABLET | Freq: Every day | ORAL | Status: DC

## 2015-07-15 ENCOUNTER — Telehealth: Payer: Self-pay | Admitting: Physician Assistant

## 2015-07-15 ENCOUNTER — Telehealth: Payer: Self-pay | Admitting: Cardiovascular Disease

## 2015-07-15 ENCOUNTER — Telehealth: Payer: Self-pay | Admitting: *Deleted

## 2015-07-15 NOTE — Telephone Encounter (Signed)
New message     Talk to a nurse today before 5. Pt only would tell me he has a question and needed to talk to her before 5 today.

## 2015-07-15 NOTE — Telephone Encounter (Signed)
lmtcb

## 2015-07-15 NOTE — Telephone Encounter (Signed)
Calling wanting to know if he could take a form of Viagra.  Advised is taking isosorbide and that is a long acting NTG so could not take and ED medications.  He verbalizes understanding.

## 2015-07-15 NOTE — Telephone Encounter (Signed)
Pt called back and when I tried to return call received his VM.  LM to call back on Monday.

## 2015-07-15 NOTE — Telephone Encounter (Signed)
The patient called to ask about taking an ED supplement called Magnum X or Magnum Blood Flow in combination with Isordil.  The ingredients in Magnum Blood Flow are natural herbs and likely would not interact.  i suggested skipping a does of isordil if he tries it.  Monitor BP.    Wynne Jury, PAC

## 2015-10-19 ENCOUNTER — Other Ambulatory Visit: Payer: Self-pay | Admitting: *Deleted

## 2015-10-19 MED ORDER — SPIRONOLACTONE 25 MG PO TABS: 12.5000 mg | ORAL_TABLET | Freq: Every day | ORAL | Status: DC

## 2016-04-17 IMAGING — CR DG LUMBAR SPINE 2-3V
3 series · 3 of 3 positions shown · non-contrast
Comparison: Lumbar spine films of 09/13/2006

CLINICAL DATA: Chronic low back pain for many years, no recent
injury, some radiculopathy

EXAM:
LUMBAR SPINE - 2-3 VIEW

[view not recorded (1 of 3)]
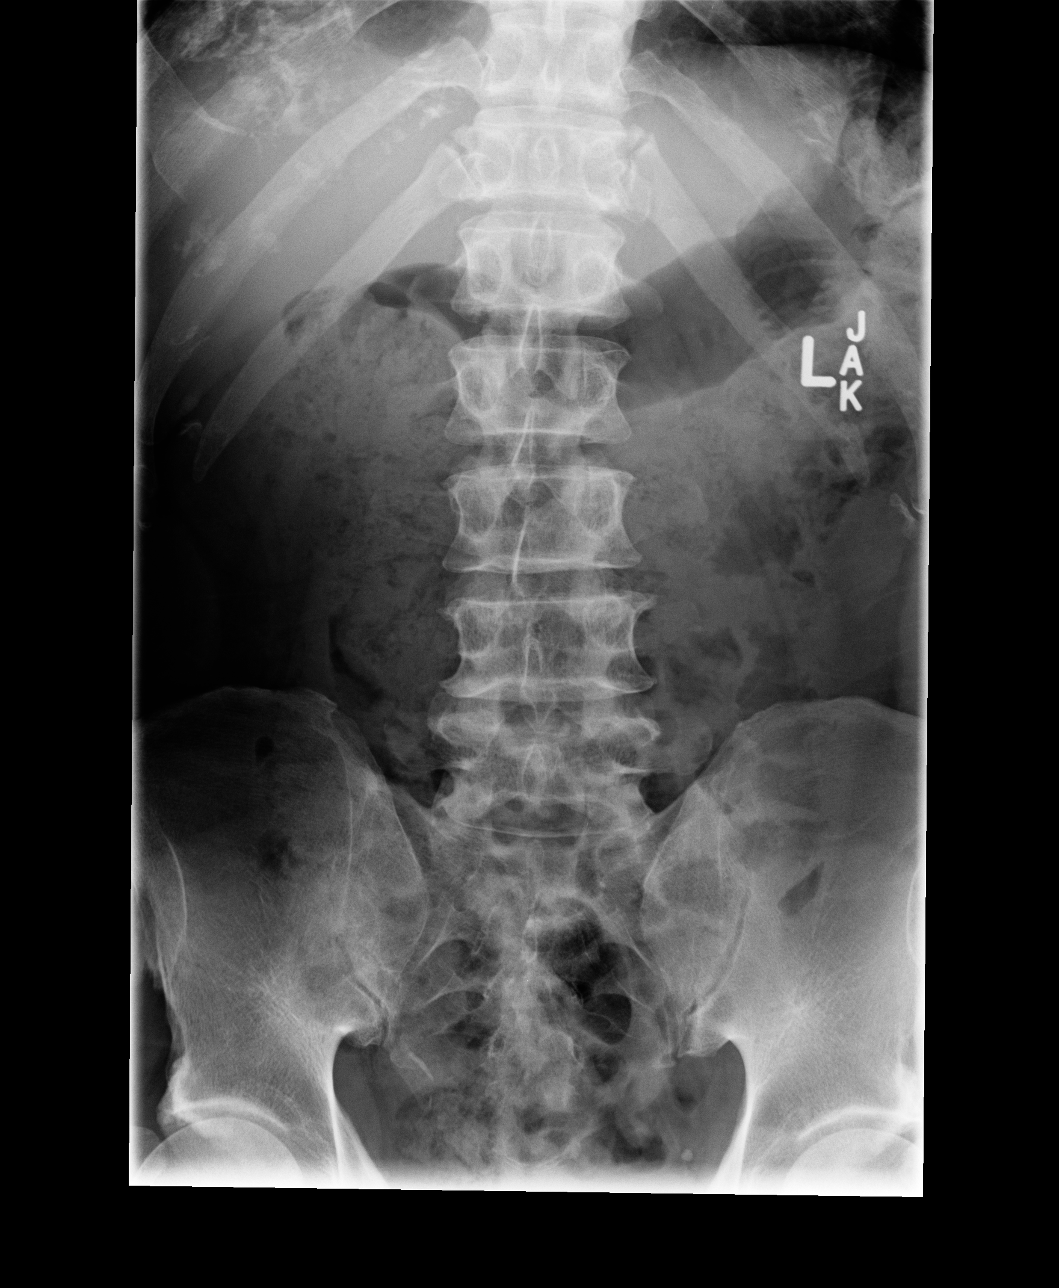

[view not recorded (2 of 3)]
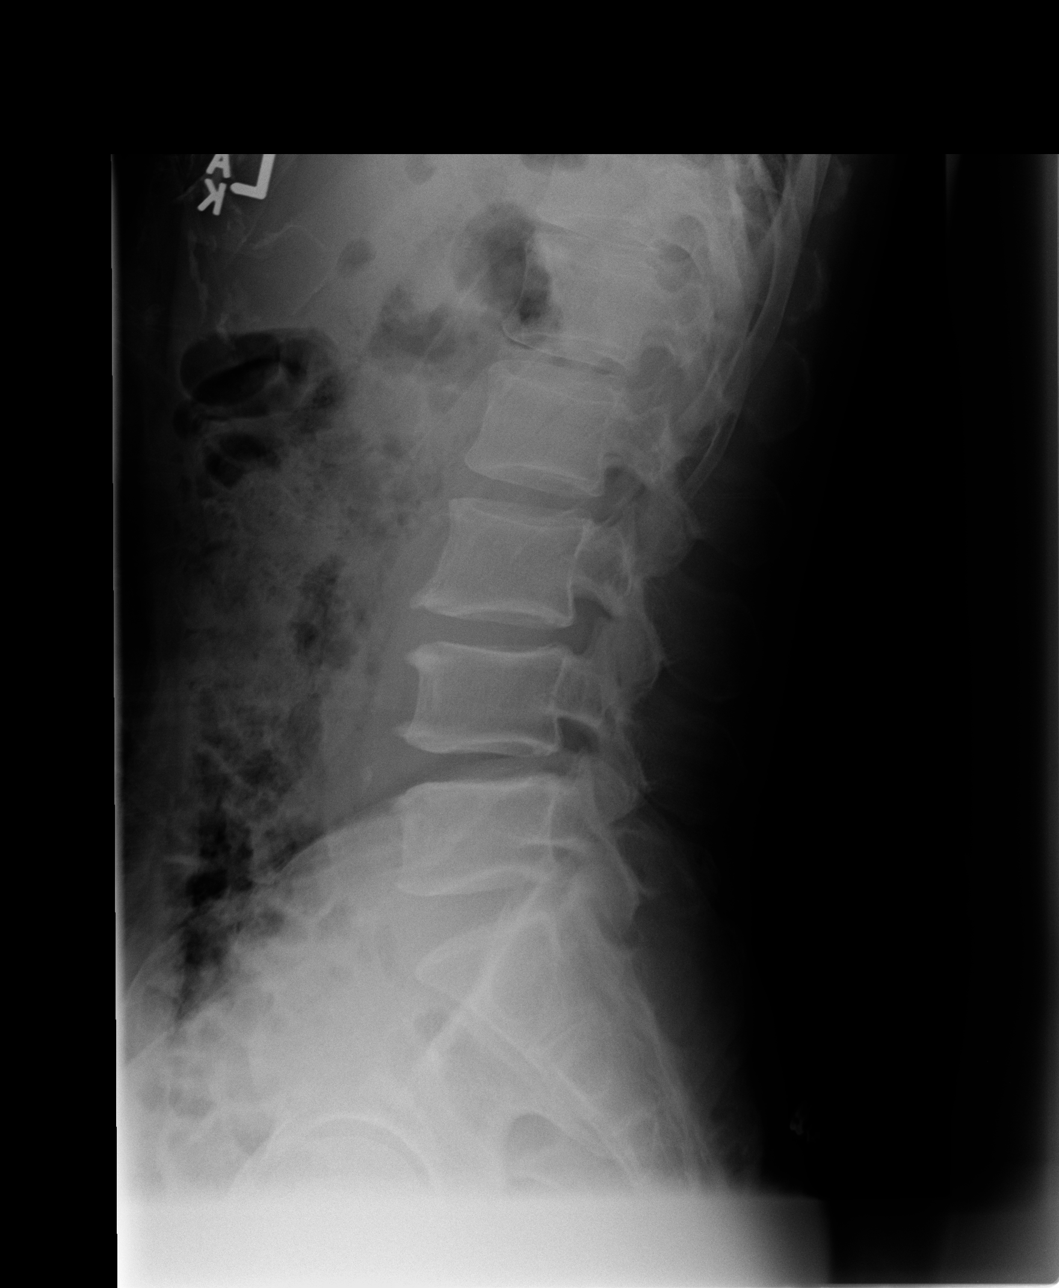

[view not recorded (3 of 3)]
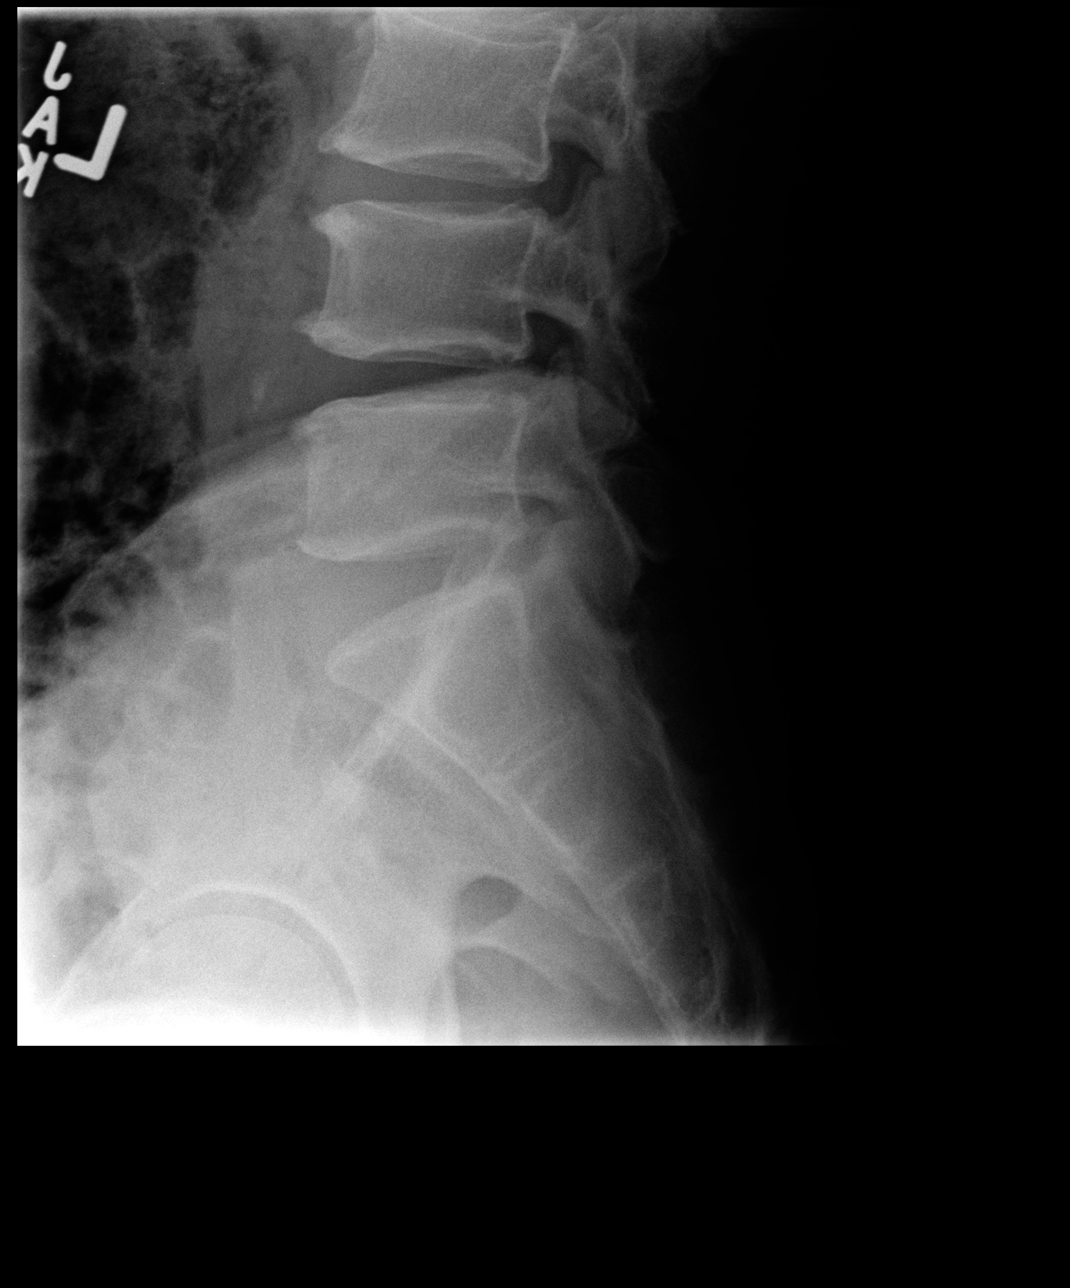

[3 of 3 positions shown; findings below may reference images not displayed]

FINDINGS: There is mild degenerative disc disease at both L3-4 and L4-5 levels
where there is slight loss of intervertebral disc space and
sclerosis with spurring when compared to prior lumbar spine films.
No compression deformity is seen. Normal alignment is maintained.
The SI joints appear corticated.
IMPRESSION: Mild degenerative disc disease at L3-4 and L4-5.

## 2016-06-15 ENCOUNTER — Ambulatory Visit: Payer: BLUE CROSS/BLUE SHIELD | Admitting: Cardiovascular Disease

## 2016-06-26 ENCOUNTER — Other Ambulatory Visit: Payer: Self-pay | Admitting: Cardiovascular Disease

## 2016-06-29 ENCOUNTER — Other Ambulatory Visit: Payer: Self-pay | Admitting: Cardiovascular Disease

## 2016-07-05 ENCOUNTER — Other Ambulatory Visit: Payer: Self-pay | Admitting: Cardiovascular Disease

## 2016-07-05 DIAGNOSIS — I5022 Chronic systolic (congestive) heart failure: Secondary | ICD-10-CM

## 2016-07-10 ENCOUNTER — Encounter (INDEPENDENT_AMBULATORY_CARE_PROVIDER_SITE_OTHER): Payer: Self-pay

## 2016-07-10 ENCOUNTER — Encounter: Payer: Self-pay | Admitting: Cardiovascular Disease

## 2016-07-10 ENCOUNTER — Ambulatory Visit (INDEPENDENT_AMBULATORY_CARE_PROVIDER_SITE_OTHER): Payer: BLUE CROSS/BLUE SHIELD | Admitting: Cardiovascular Disease

## 2016-07-10 VITALS — BP 168/90 | HR 47 | Ht 72.0 in | Wt 166.0 lb

## 2016-07-10 DIAGNOSIS — I5022 Chronic systolic (congestive) heart failure: Secondary | ICD-10-CM

## 2016-07-10 DIAGNOSIS — I10 Essential (primary) hypertension: Secondary | ICD-10-CM | POA: Diagnosis not present

## 2016-07-10 MED ORDER — SPIRONOLACTONE 25 MG PO TABS: 25.0000 mg | ORAL_TABLET | Freq: Every day | ORAL | 3 refills | Status: DC

## 2016-07-10 NOTE — Patient Instructions (Signed)
Medication Instructions:  INCREASE Aldactone to 25 mg once daily   Labwork: Your physician recommends that you return for lab work in: basic metabolic panel in 1-2 weeks    Testing/Procedures: None Ordered   Follow-Up: Your physician wants you to follow-up in:  6 months with Dr. Acie Fredrickson.  You will receive a reminder letter in the mail two months in advance. If you don't receive a letter, please call our office to schedule the follow-up appointment.   If you need a refill on your cardiac medications before your next appointment, please call your pharmacy.   Thank you for choosing [facility]! Christen Bame, RN [phone]

## 2016-07-10 NOTE — Progress Notes (Signed)
Cardiology Office Note   Date:  07/10/2016   ID:  Terry Moss, DOB [birth year], MRN [id number]  PCP:  Shirline Frees, MD  Cardiologist:   [name], MD   Chief Complaint  Patient presents with  . Follow-up    CHF   Problem List: 1. Hypertension 2. Nonsustained ventricular tachycardia 3. Chronic systolic congestive heart failure-ejection fraction of 40% by stress Myoview study in 2009  EF 25-30% by echo 2016.   History of Present Illness:  The patient is a middle-aged gentleman with a history of hypertension,  palpitations, and nonsustained ventricular tachycardia.   The patient has been followed for many years for mildly dilated left  ventricle. He has also had some nonsustained ventricular tachycardia.  He has had numerous stress test. All of which have been negative.  Oct. 27, 2015:  Terry Moss is seen today after ~6 year absence.  He was followed for years for mild systolic CHF, and NSVT.  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 No smptoms. Had several stress tests for years from 2000 to 2009 - all of which were negative.  He exercises regularly without any difficutly.  He denies any syncope or pre-syncope.  Jan. 4, 2016: Terry Moss is seen back today for follow up of his CHF. He has had HTN for years. He was seen in Oct. After a multiple year absence.  He is on the full dose of hydralazine and imdur He eats out quite a bit.  He has significant DOE after taking the hydralazine and Imdur. Struggles while playing basketball or running  Jan. 25, 2016:  BP is much better. May be slightly too low at times. Has symptoms of orthostasis on occasion.     September 20, 2014:   Terry Moss is a 61 y.o. male who presents for follow up of his CHF. We had arranged for him to have a cardiac cath but it was cancelled due to a bump in his creatinine.  He has known CKD.  Breathing is ok Still runs on a regular basis.  Nov 08, 2014: Terry Moss continues to do very well. He still exercises on a regular basis. We originally had scheduled him for cardiac cath but canceled as of a  creatinine is still around 2.0. He denies any chest pain. His breathing is okay.   Sept. 13, 2016:   Terry Moss is doing ok Has chronic systolic CHF.  Did not have his cath due to CKD Still very active, plays basketball regularly without any symptoms .   Dec. 6, 2016:  Terry Moss has some chest pain  - center of his chest , radiated to the right side.  Felt more muscular after several hours.  Now goes into his back  Takes his BP at home.  Typically 110s- 125 range / 70-80s.  Jan. 9, 2018:  Doing well.  Still working out. Has not had the cath yet.   Echocardiogram in May, 2016 revealed an improved left ventricle systolic function with an EF of 45-50%. His previous EF had been as low as 20%. Still eating some salty foods.  Still eats out 3-4 times a week.    Past Medical History:  Diagnosis Date  . Anemia   . Cardiomegaly   . Hypertension   . Sinus complaint     Past Surgical History:  Procedure Laterality Date  . pleurisy  1995   Right lung surgery  Current Outpatient Prescriptions  Medication Sig Dispense Refill  . furosemide (LASIX) 40 MG tablet Take 1 tablet (40 mg total) by mouth daily. 90 tablet 3  . hydrALAZINE (APRESOLINE) 50 MG tablet TAKE 1 TABLET(50 MG) BY MOUTH THREE TIMES DAILY 30 tablet 0  . isosorbide dinitrate (ISORDIL) 20 MG tablet Take 1 tablet (20 mg total) by mouth 3 (three) times daily. Please keep 07/10/16 appt for further refills 90 tablet 0  . potassium chloride (K-DUR) 10 MEQ tablet Take 1 tablet (10 mEq total) by mouth daily. 90 tablet 3  . spironolactone (ALDACTONE) 25 MG tablet Take 0.5 tablets (12.5 mg total) by mouth daily. 45 tablet 2   No current facility-administered medications for this visit.     Allergies:   Patient has no known allergies.    Social History:  The patient  reports that he  has never smoked. He does not have any smokeless tobacco history on file. He reports that he does not drink alcohol or use drugs.   Family History:  The patient's family history includes Heart attack in his brother; Heart disease in his brother, father, and sister; Hypertension in his mother; Stroke in his brother, father, and mother.    ROS:  Please see the history of present illness.    Review of Systems: Constitutional:  denies fever, chills, diaphoresis, appetite change and fatigue.  HEENT: denies photophobia, eye pain, redness, hearing loss, ear pain, congestion, sore throat, rhinorrhea, sneezing, neck pain, neck stiffness and tinnitus.  Respiratory: admits to SOB, DOE, cough, chest tightness, and wheezing.  Cardiovascular: denies chest pain, palpitations and leg swelling.  Gastrointestinal: denies nausea, vomiting, abdominal pain, diarrhea, constipation, blood in stool.  Genitourinary: denies dysuria, urgency, frequency, hematuria, flank pain and difficulty urinating.  Musculoskeletal: denies  myalgias, back pain, joint swelling, arthralgias and gait problem.   Skin: denies pallor, rash and wound.  Neurological: denies dizziness, seizures, syncope, weakness, light-headedness, numbness and headaches.   Hematological: denies adenopathy, easy bruising, personal or family bleeding history.  Psychiatric/ Behavioral: denies suicidal ideation, mood changes, confusion, nervousness, sleep disturbance and agitation.       All other systems are reviewed and negative.    PHYSICAL EXAM: VS:  BP (!) 168/90 (BP Location: Left Arm, Patient Position: Sitting, Cuff Size: Normal)   Pulse (!) 47   Ht 6' (1.829 m)   Wt 166 lb (75.3 kg)   SpO2 98%   BMI 22.51 kg/m  , BMI Body mass index is 22.51 kg/m. GEN: Well nourished, well developed, in no acute distress  HEENT: normal  Neck: no JVD, carotid bruits, or masses Cardiac: RRR; frequent premature beats .  no murmurs, rubs, or gallops,no edema   Respiratory:  clear to auscultation bilaterally, normal work of breathing GI: soft, nontender, nondistended, + BS MS: no deformity or atrophy  Skin: warm and dry, no rash Neuro:  Strength and sensation are intact Psych: normal   EKG:  EKG is ordered today.   Marked sinus brady with PACs and PVCs. LVH with QRS widening.    Recent Labs: No results found for requested labs within last 8760 hours.    Lipid Panel No results found for: CHOL, TRIG, HDL, CHOLHDL, VLDL, LDLCALC, LDLDIRECT    Wt Readings from Last 3 Encounters:  07/10/16 166 lb (75.3 kg)  06/07/15 164 lb 12.8 oz (74.8 kg)  03/15/15 165 lb (74.8 kg)      Other studies Reviewed: Additional studies/ records that were reviewed today include: . Review of  the above records demonstrates:    ASSESSMENT AND PLAN:  1. Hypertension -    his blood pressure is mildly elevated today. We will increase the Aldactone to 25 mg a day. He'll need a basic medical profile in one to 2 weeks.  2. Nonsustained ventricular tachycardia  3. Chronic combined systolic and diastolic congestive heart failure-ejection fraction of 40% by stress Myoview study in 2009  EF 25-30% by echo 2016.   His left ventricular systolic function has improved and his EF is now 45-50%.   He does have grade 2 diastolic dysfunction.  We will increase the Aldactone to 25 mg a day. I've advised him to stay away from eating any extra salt. He needs to avoid eating out at restaurants. We will check a basic medical profile in one to 2 weeks. He'll keep a blood pressure log until that time.  Current medicines are reviewed at length with the patient today.  The patient does not have concerns regarding medicines.  The following changes have been made:  See above.   Labs/ tests ordered today include: BMP    No orders of the defined types were placed in this encounter.   Disposition:   FU with me in 6 months  Signed, [name], MD  07/10/2016 2:41 PM    [address], [place], [place]  [address] Phone: [phone]; Fax: [phone]

## 2016-07-11 ENCOUNTER — Other Ambulatory Visit: Payer: Self-pay | Admitting: Cardiovascular Disease

## 2016-07-11 DIAGNOSIS — I5022 Chronic systolic (congestive) heart failure: Secondary | ICD-10-CM

## 2016-07-16 ENCOUNTER — Other Ambulatory Visit: Payer: Self-pay | Admitting: Internal Medicine

## 2016-07-24 ENCOUNTER — Other Ambulatory Visit: Payer: BLUE CROSS/BLUE SHIELD

## 2016-07-27 ENCOUNTER — Other Ambulatory Visit: Payer: BLUE CROSS/BLUE SHIELD

## 2016-08-05 ENCOUNTER — Other Ambulatory Visit: Payer: Self-pay | Admitting: Cardiovascular Disease

## 2016-08-14 ENCOUNTER — Other Ambulatory Visit: Payer: BLUE CROSS/BLUE SHIELD

## 2016-09-25 ENCOUNTER — Other Ambulatory Visit (INDEPENDENT_AMBULATORY_CARE_PROVIDER_SITE_OTHER): Payer: BLUE CROSS/BLUE SHIELD

## 2016-09-25 DIAGNOSIS — I5022 Chronic systolic (congestive) heart failure: Secondary | ICD-10-CM | POA: Diagnosis not present

## 2016-09-26 LAB — BASIC METABOLIC PANEL
BUN / CREAT RATIO: 17 (ref 10–24)
BUN: 33 mg/dL — AB (ref 8–27)
CHLORIDE: 97 mmol/L (ref 96–106)
CO2: 26 mmol/L (ref 18–29)
Calcium: 9.6 mg/dL (ref 8.6–10.2)
Creatinine, Ser: 1.98 mg/dL — ABNORMAL HIGH (ref 0.76–1.27)
GFR, EST AFRICAN AMERICAN: 41 mL/min/{1.73_m2} — AB (ref >59)
GFR, EST NON AFRICAN AMERICAN: 35 mL/min/{1.73_m2} — AB (ref >59)
Glucose: 79 mg/dL (ref 65–99)
Potassium: 5.8 mmol/L — ABNORMAL HIGH (ref 3.5–5.2)
Sodium: 137 mmol/L (ref 134–144)

## 2016-09-28 ENCOUNTER — Telehealth: Payer: Self-pay | Admitting: Cardiovascular Disease

## 2016-09-28 DIAGNOSIS — E875 Hyperkalemia: Secondary | ICD-10-CM

## 2016-09-28 NOTE — Telephone Encounter (Signed)
Agree with reducing the foods that are high in potasium and rechecking in several weeks

## 2016-09-28 NOTE — Telephone Encounter (Signed)
New message     Pt is returning Michelles call about his lab results

## 2016-09-28 NOTE — Telephone Encounter (Signed)
Called patient left message on personal voice mail Dr.Nahser agreed with reducing foods high in potassium.Repeat BMP 10/19/16.

## 2016-09-28 NOTE — Telephone Encounter (Signed)
Returned call to patient lab results given.Stated he never increased Aldactone he is taking 12.5 mg daily.He does eat foods high in potassium he will decrease.Advised to repeat BMP in 3 weeks.Stated his B/P has been good.Message sent to Dr.Nahser for review.

## 2016-10-19 ENCOUNTER — Other Ambulatory Visit: Payer: BLUE CROSS/BLUE SHIELD

## 2016-11-23 ENCOUNTER — Telehealth: Payer: Self-pay | Admitting: Cardiovascular Disease

## 2016-11-23 NOTE — Telephone Encounter (Signed)
Left message for patient to call back  

## 2016-11-23 NOTE — Telephone Encounter (Signed)
Patient called back about wanting to take "5 Day Forecast 1600 mg Dietary Supplement Pill ". Dr. Acie Fredrickson stated patient should be fine taking supplement. Informed patient, as with any new supplement, to be cautious of any reactions and to stop medication if he has any adverse reactions to the supplement. Patient verbalized understanding.

## 2016-11-23 NOTE — Telephone Encounter (Signed)
I have discuss this medication with Howie Ill and agree with her note.

## 2016-11-23 NOTE — Telephone Encounter (Signed)
New Message   pt verbalized that he is calling for rn   He wants to take  Natural supplement and wants to speak to rn to see if  is okay  He did not want to give the name of the medication

## 2017-03-05 ENCOUNTER — Other Ambulatory Visit: Payer: Self-pay | Admitting: Cardiovascular Disease

## 2017-04-01 ENCOUNTER — Ambulatory Visit (INDEPENDENT_AMBULATORY_CARE_PROVIDER_SITE_OTHER): Payer: BLUE CROSS/BLUE SHIELD | Admitting: Cardiovascular Disease

## 2017-04-01 ENCOUNTER — Encounter: Payer: Self-pay | Admitting: Cardiovascular Disease

## 2017-04-01 VITALS — BP 142/60 | HR 51 | Ht 72.0 in | Wt 170.0 lb

## 2017-04-01 DIAGNOSIS — I5022 Chronic systolic (congestive) heart failure: Secondary | ICD-10-CM | POA: Diagnosis not present

## 2017-04-01 DIAGNOSIS — Z13220 Encounter for screening for lipoid disorders: Secondary | ICD-10-CM

## 2017-04-01 DIAGNOSIS — I10 Essential (primary) hypertension: Secondary | ICD-10-CM

## 2017-04-01 NOTE — Progress Notes (Signed)
Cardiology Office Note   Date:  04/01/2017   ID:  Knute, Mazzuca 14-May-1956, MRN [id number]  PCP:  Shirline Frees, MD  Cardiologist:   [name], MD   Chief Complaint  Patient presents with  . Congestive Heart Failure   Problem List: 1. Hypertension 2. Nonsustained ventricular tachycardia 3. Chronic systolic congestive heart failure-ejection fraction of 40% by stress Myoview study in 2009  EF 25-30% by echo 2016.   History of Present Illness:  The patient is a middle-aged gentleman with a history of hypertension,  palpitations, and nonsustained ventricular tachycardia.   The patient has been followed for many years for mildly dilated left  ventricle. He has also had some nonsustained ventricular tachycardia.  He has had numerous stress test. All of which have been negative.  Oct. 27, 2015:  Dartanian is seen today after ~6 year absence.  He was followed for years for mild systolic CHF, and NSVT.  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 No smptoms. Had several stress tests for years from 2000 to 2009 - all of which were negative.  He exercises regularly without any difficutly.  He denies any syncope or pre-syncope.  Jan. 4, 2016: Byrl is seen back today for follow up of his CHF. He has had HTN for years. He was seen in Oct. After a multiple year absence.  He is on the full dose of hydralazine and imdur He eats out quite a bit.  He has significant DOE after taking the hydralazine and Imdur. Struggles while playing basketball or running  Jan. 25, 2016:  BP is much better. May be slightly too low at times. Has symptoms of orthostasis on occasion.     September 20, 2014:   Tyran Huser Hensley is a 61 y.o. male who presents for follow up of his CHF. We had arranged for him to have a cardiac cath but it was cancelled due to a bump in his creatinine.  He has known CKD.  Breathing is ok Still runs on a regular basis.  Nov 08, 2014: Riano continues to do very well. He still exercises on a regular basis. We originally had scheduled him for cardiac cath but canceled as of a  creatinine is still around 2.0. He denies any chest pain. His breathing is okay.   Sept. 13, 2016:   Sirus is doing ok Has chronic systolic CHF.  Did not have his cath due to CKD Still very active, plays basketball regularly without any symptoms .   Dec. 6, 2016:  Xzayvion has some chest pain  - center of his chest , radiated to the right side.  Felt more muscular after several hours.  Now goes into his back  Takes his BP at home.  Typically 110s- 125 range / 70-80s.  Jan. 9, 2018:  Doing well.  Still working out. Has not had the cath yet.   Echocardiogram in May, 2016 revealed an improved left ventricle systolic function with an EF of 45-50%. His previous EF had been as low as 20%. Still eating some salty foods.  Still eats out 3-4 times a week.    Oct. 1, 2018:  Steelman is doing well.   No CP or dyspnea..  Plays basketball twice a week .  Still eating out 3-4 times a week    Past Medical History:  Diagnosis Date  . Anemia   . Cardiomegaly   . Hypertension   .  Sinus complaint     Past Surgical History:  Procedure Laterality Date  . pleurisy  1995   Right lung surgery      Current Outpatient Prescriptions  Medication Sig Dispense Refill  . DHA-EPA-Vit B6-B12-Folic Acid (CARDIOVID PLUS PO) Take 1 tablet by mouth 2 (two) times daily.    . furosemide (LASIX) 40 MG tablet TAKE 1 TABLET(40 MG) BY MOUTH DAILY 90 tablet 3  . hydrALAZINE (APRESOLINE) 50 MG tablet TAKE 1 TABLET(50 MG) BY MOUTH THREE TIMES DAILY 90 tablet 3  . isosorbide dinitrate (ISORDIL) 20 MG tablet TAKE 1 TABLET BY MOUTH THREE TIMES DAILY 270 tablet 3  . potassium chloride (K-DUR) 10 MEQ tablet Take 1 tablet (10 mEq total) by mouth daily. 90 tablet 3  . Red Yeast Rice Extract (RED YEAST RICE PO) Take 1 capsule by mouth daily.    Marland Kitchen spironolactone  (ALDACTONE) 25 MG tablet Take 1 tablet (25 mg total) by mouth daily. 90 tablet 3   No current facility-administered medications for this visit.     Allergies:   Patient has no known allergies.    Social History:  The patient  reports that he has never smoked. He has never used smokeless tobacco. He reports that he does not drink alcohol or use drugs.   Family History:  The patient's family history includes Heart attack in his brother; Heart disease in his brother, father, and sister; Hypertension in his mother; Stroke in his brother, father, and mother.    ROS:  Please see the history of present illness.    Review of Systems: Constitutional:  denies fever, chills, diaphoresis, appetite change and fatigue.  HEENT: denies photophobia, eye pain, redness, hearing loss, ear pain, congestion, sore throat, rhinorrhea, sneezing, neck pain, neck stiffness and tinnitus.  Respiratory: admits to SOB, DOE, cough, chest tightness, and wheezing.  Cardiovascular: denies chest pain, palpitations and leg swelling.  Gastrointestinal: denies nausea, vomiting, abdominal pain, diarrhea, constipation, blood in stool.  Genitourinary: denies dysuria, urgency, frequency, hematuria, flank pain and difficulty urinating.  Musculoskeletal: denies  myalgias, back pain, joint swelling, arthralgias and gait problem.   Skin: denies pallor, rash and wound.  Neurological: denies dizziness, seizures, syncope, weakness, light-headedness, numbness and headaches.   Hematological: denies adenopathy, easy bruising, personal or family bleeding history.  Psychiatric/ Behavioral: denies suicidal ideation, mood changes, confusion, nervousness, sleep disturbance and agitation.       All other systems are reviewed and negative.   Physical Exam: Blood pressure (!) 142/60, pulse (!) 51, height 6' (1.829 m), weight 170 lb (77.1 kg), SpO2 97 %.  GEN:  Well nourished, well developed in no acute distress HEENT: Normal NECK: No  JVD; No carotid bruits LYMPHATICS: No lymphadenopathy CARDIAC: RR,  Bradycardia.  occasiona premature beats. , no murmurs, rubs, gallops RESPIRATORY:  Clear to auscultation without rales, wheezing or rhonchi  ABDOMEN: Soft, non-tender, non-distended MUSCULOSKELETAL:  No edema; No deformity  SKIN: Warm and dry NEUROLOGIC:  Alert and oriented x 3   EKG:  EKG is ordered today.   Marked sinus brady with PACs and PVCs. LVH with QRS widening.    Recent Labs: 09/25/2016: BUN 33; Creatinine, Ser 1.98; Potassium 5.8; Sodium 137    Lipid Panel No results found for: CHOL, TRIG, HDL, CHOLHDL, VLDL, LDLCALC, LDLDIRECT    Wt Readings from Last 3 Encounters:  04/01/17 170 lb (77.1 kg)  07/10/16 166 lb (75.3 kg)  06/07/15 164 lb 12.8 oz (74.8 kg)      Other studies  Reviewed: Additional studies/ records that were reviewed today include: . Review of the above records demonstrates:    ASSESSMENT AND PLAN:  1. Hypertension -     BP is still elevated.   Still eats out 3-4 times a week. I cautioned him about eating out much. 2. Nonsustained ventricular tachycardia  3. Chronic combined systolic and diastolic congestive heart failure-  His left ventricular function has improved. He's not having any episodes of chest pain or shortness of breath. He plays basket all twice week. Continue current medications. We'll check a basic medical profile today.  4. Hyperlipidemia: He states that his cholesterol levels have been mildly elevated in the past. We'll check fasting lipids today and liver enzymes along with the basic medical profile.  Current medicines are reviewed at length with the patient today.  The patient does not have concerns regarding medicines.  The following changes have been made:  See above.   Labs/ tests ordered today include: BMP    No orders of the defined types were placed in this encounter.   Disposition:   FU with me in 6 months  Signed, [name], MD  04/01/2017  3:23 PM    [facility] [address], [place], [place]  [address] Phone: [phone]; Fax: [phone]

## 2017-04-01 NOTE — Patient Instructions (Signed)

## 2017-04-02 LAB — BASIC METABOLIC PANEL
BUN/Creatinine Ratio: 15 (ref 10–24)
BUN: 29 mg/dL — ABNORMAL HIGH (ref 8–27)
CALCIUM: 9.2 mg/dL (ref 8.6–10.2)
CHLORIDE: 100 mmol/L (ref 96–106)
CO2: 28 mmol/L (ref 20–29)
Creatinine, Ser: 1.95 mg/dL — ABNORMAL HIGH (ref 0.76–1.27)
GFR calc Af Amer: 41 mL/min/{1.73_m2} — ABNORMAL LOW (ref >59)
GFR calc non Af Amer: 36 mL/min/{1.73_m2} — ABNORMAL LOW (ref >59)
GLUCOSE: 83 mg/dL (ref 65–99)
POTASSIUM: 5.2 mmol/L (ref 3.5–5.2)
Sodium: 140 mmol/L (ref 134–144)

## 2017-04-02 LAB — LIPID PANEL
CHOLESTEROL TOTAL: 176 mg/dL (ref 100–199)
Chol/HDL Ratio: 2.9 ratio (ref 0.0–5.0)
HDL: 61 mg/dL (ref >39)
LDL CALC: 96 mg/dL (ref 0–99)
TRIGLYCERIDES: 94 mg/dL (ref 0–149)
VLDL CHOLESTEROL CAL: 19 mg/dL (ref 5–40)

## 2017-04-02 LAB — HEPATIC FUNCTION PANEL
ALT: 14 IU/L (ref 0–44)
AST: 23 IU/L (ref 0–40)
Albumin: 4.3 g/dL (ref 3.6–4.8)
Alkaline Phosphatase: 58 IU/L (ref 39–117)
Bilirubin Total: 0.2 mg/dL (ref 0.0–1.2)
Bilirubin, Direct: 0.07 mg/dL (ref 0.00–0.40)
Total Protein: 7.1 g/dL (ref 6.0–8.5)

## 2017-04-23 ENCOUNTER — Other Ambulatory Visit: Payer: Self-pay | Admitting: Cardiovascular Disease

## 2017-07-04 ENCOUNTER — Other Ambulatory Visit: Payer: Self-pay | Admitting: Cardiovascular Disease

## 2017-07-04 DIAGNOSIS — I5022 Chronic systolic (congestive) heart failure: Secondary | ICD-10-CM

## 2017-07-06 ENCOUNTER — Other Ambulatory Visit: Payer: Self-pay | Admitting: Cardiovascular Disease

## 2017-07-30 ENCOUNTER — Other Ambulatory Visit: Payer: Self-pay | Admitting: Family Medicine

## 2017-07-30 DIAGNOSIS — M25562 Pain in left knee: Secondary | ICD-10-CM

## 2017-07-31 ENCOUNTER — Other Ambulatory Visit: Payer: Self-pay | Admitting: Cardiovascular Disease

## 2017-08-17 ENCOUNTER — Ambulatory Visit
Admission: RE | Admit: 2017-08-17 | Discharge: 2017-08-17 | Disposition: A | Discharge status: Home / Self Care | Payer: 59 | Source: Ambulatory Visit | Attending: Family Medicine | Admitting: Family Medicine

## 2017-08-17 DIAGNOSIS — M25562 Pain in left knee: Secondary | ICD-10-CM

## 2017-09-30 ENCOUNTER — Other Ambulatory Visit: Payer: Self-pay | Admitting: Cardiovascular Disease

## 2017-10-04 ENCOUNTER — Other Ambulatory Visit: Payer: Self-pay | Admitting: Cardiovascular Disease

## 2017-10-08 ENCOUNTER — Telehealth: Payer: Self-pay | Admitting: Cardiovascular Disease

## 2017-10-08 NOTE — Telephone Encounter (Signed)
Reviewed patient's medication list with Tana Coast, RPh. She advised spironolactone can cause gout but not as frequently as the diuretics containing thiazide. She advised that there is not a documented increase in uric acid with hydralazine. She advised that taking Losartan could decrease uric acid in the patient. I called patient who reports uric acid level was elevated at about 9 (with normal being around 4 per patient). He states he was given prednisone 10 mg and tramadol for treatment.  I advised him of the information from [facility] and scheduled his appointment with Dr. Acie Fredrickson for next week. I advised him to continue current medications. He verbalized understanding and agreement and thanked me for the call.

## 2017-10-08 NOTE — Telephone Encounter (Signed)
New Message    Pt c/o medication issue:  1. Name of Medication: spironolactone (ALDACTONE) 25 MG tablet  And hydrALAZINE (APRESOLINE) 50 MG tablet   2. How are you currently taking this medication (dosage and times per day)? Once a dat   3. Are you having a reaction (difficulty breathing--STAT)? None   4. What is your medication issue? Patient states he went to urgent care yesterday. They advised him that the two medications may be the cause of him having gout. Please call to discuss.

## 2017-10-09 ENCOUNTER — Encounter: Payer: Self-pay | Admitting: Cardiovascular Disease

## 2017-10-14 ENCOUNTER — Encounter: Payer: Self-pay | Admitting: Cardiovascular Disease

## 2017-10-14 ENCOUNTER — Ambulatory Visit (INDEPENDENT_AMBULATORY_CARE_PROVIDER_SITE_OTHER): Payer: Managed Care, Other (non HMO) | Admitting: Cardiovascular Disease

## 2017-10-14 ENCOUNTER — Encounter (INDEPENDENT_AMBULATORY_CARE_PROVIDER_SITE_OTHER): Payer: Self-pay

## 2017-10-14 VITALS — BP 170/80 | HR 65 | Ht 72.0 in | Wt 169.4 lb

## 2017-10-14 DIAGNOSIS — I5022 Chronic systolic (congestive) heart failure: Secondary | ICD-10-CM

## 2017-10-14 DIAGNOSIS — I493 Ventricular premature depolarization: Secondary | ICD-10-CM

## 2017-10-14 MED ORDER — LOSARTAN POTASSIUM 50 MG PO TABS: 50.0000 mg | ORAL_TABLET | Freq: Every day | ORAL | 3 refills | Status: DC

## 2017-10-14 MED ORDER — COLCHICINE 0.6 MG PO TABS: 0.6000 mg | ORAL_TABLET | Freq: Two times a day (BID) | ORAL | 0 refills | Status: DC

## 2017-10-14 MED ORDER — HYDRALAZINE HCL 100 MG PO TABS: 100.0000 mg | ORAL_TABLET | Freq: Three times a day (TID) | ORAL | 3 refills | Status: DC

## 2017-10-14 NOTE — Progress Notes (Signed)
Cardiology Office Note   Date:  10/14/2017   ID:  Terry Nephew Sr., DOB 1955-11-10, MRN [id number]  PCP:  Shirline Frees, MD  Cardiologist:   [name], MD   Chief Complaint  Patient presents with  . Congestive Heart Failure   Problem List: 1. Hypertension 2. Nonsustained ventricular tachycardia 3. Chronic systolic congestive heart failure-ejection fraction of 40% by stress Myoview study in 2009  EF 25-30% by echo 2016.     The patient is a middle-aged gentleman with a history of hypertension,  palpitations, and nonsustained ventricular tachycardia.   The patient has been followed for many years for mildly dilated left  ventricle. He has also had some nonsustained ventricular tachycardia.  He has had numerous stress test. All of which have been negative.  Oct. 27, 2015:  Terry Moss is seen today after ~6 year absence.  He was followed for years for mild systolic CHF, and NSVT.  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 No smptoms. Had several stress tests for years from 2000 to 2009 - all of which were negative.  He exercises regularly without any difficutly.  He denies any syncope or pre-syncope.  Jan. 4, 2016: Terry Moss is seen back today for follow up of his CHF. He has had HTN for years. He was seen in Oct. After a multiple year absence.  He is on the full dose of hydralazine and imdur He eats out quite a bit.  He has significant DOE after taking the hydralazine and Imdur. Struggles while playing basketball or running  Jan. 25, 2016:  BP is much better. May be slightly too low at times. Has symptoms of orthostasis on occasion.     September 20, 2014:   Terry Moss. is a 62 y.o. male who presents for follow up of his CHF. We had arranged for him to have a cardiac cath but it was cancelled due to a bump in his creatinine.  He has known CKD.  Breathing is ok Still runs on a regular basis.  Nov 08, 2014: Terry Moss  continues to do very well. He still exercises on a regular basis. We originally had scheduled him for cardiac cath but canceled as of a  creatinine is still around 2.0. He denies any chest pain. His breathing is okay.   Sept. 13, 2016:   Terry Moss is doing ok Has chronic systolic CHF.  Did not have his cath due to CKD Still very active, plays basketball regularly without any symptoms .   Dec. 6, 2016:  Terry Moss has some chest pain  - center of his chest , radiated to the right side.  Felt more muscular after several hours.  Now goes into his back  Takes his BP at home.  Typically 110s- 125 range / 70-80s.  Jan. 9, 2018:  Doing well.  Still working out. Has not had the cath yet.   Echocardiogram in May, 2016 revealed an improved left ventricle systolic function with an EF of 45-50%. His previous EF had been as low as 20%. Still eating some salty foods.  Still eats out 3-4 times a week.    Oct. 1, 2018:  Terry Moss is doing well.   No CP or dyspnea..  Plays basketball twice a week .  Still eating out 3-4 times a week   October 14, 2017:  Terry Moss is seen today ,   No dyspnea or CP . Has been having some  issues with gout.  BP is typically well controlled.  Is elevated today .   He has been stressed.    Past Medical History:  Diagnosis Date  . Anemia   . Cardiomegaly   . Hypertension   . Sinus complaint     Past Surgical History:  Procedure Laterality Date  . pleurisy  1995   Right lung surgery      Current Outpatient Medications  Medication Sig Dispense Refill  . DHA-EPA-Vit B6-B12-Folic Acid (CARDIOVID PLUS PO) Take 1 tablet by mouth 2 (two) times daily.    . furosemide (LASIX) 40 MG tablet TAKE 1 TABLET(40 MG) BY MOUTH DAILY 90 tablet 3  . isosorbide dinitrate (ISORDIL) 20 MG tablet TAKE 1 TABLET BY MOUTH THREE TIMES DAILY 270 tablet 1  . OVER THE COUNTER MEDICATION Take 2 tablets by mouth 3 (three) times daily. Beet Root (Helps support Blood Pressure)    . OVER THE  COUNTER MEDICATION Take 1 tablet by mouth daily. Tuna omega-3 oil    . OVER THE COUNTER MEDICATION Take 1 capsule by mouth daily. Albaplex ( support kidney function)    . OVER THE COUNTER MEDICATION Take 1 capsule by mouth daily. Colon clenz    . Phosphatidylserine 100 MG CAPS Take 1 capsule by mouth daily. Aids brain function    . potassium chloride (K-DUR) 10 MEQ tablet Take 1 tablet (10 mEq total) by mouth daily. 90 tablet 3  . predniSONE (STERAPRED UNI-PAK 48 TAB) 10 MG (48) TBPK tablet Take 1 tablet by mouth as directed.  0  . Red Yeast Rice Extract (RED YEAST RICE PO) Take 1 capsule by mouth daily.    Terry Moss spironolactone (ALDACTONE) 25 MG tablet Take 1 tablet (25 mg total) by mouth daily. 90 tablet 2  . colchicine 0.6 MG tablet Take 1 tablet (0.6 mg total) by mouth 2 (two) times daily. 60 tablet 0  . hydrALAZINE (APRESOLINE) 100 MG tablet Take 1 tablet (100 mg total) by mouth 3 (three) times daily. 270 tablet 3  . losartan (COZAAR) 50 MG tablet Take 1 tablet (50 mg total) by mouth daily. 90 tablet 3   No current facility-administered medications for this visit.     Allergies:   Patient has no known allergies.    Social History:  The patient  reports that he has never smoked. He has never used smokeless tobacco. He reports that he does not drink alcohol or use drugs.   Family History:  The patient's family history includes Heart attack in his brother; Heart disease in his brother, father, and sister; Hypertension in his mother; Stroke in his brother, father, and mother.    ROS:   Noted in current history, all other systems are negative.   Physical Exam: Pulse 65, height 6' (1.829 m), weight 169 lb 6.4 oz (76.8 kg), SpO2 97 %.  GEN:  Middle age man,   NAD  HEENT: Normal NECK: No JVD; No carotid bruits LYMPHATICS: No lymphadenopathy CARDIAC: RR, with frequent PVCs RESPIRATORY:  Clear to auscultation without rales, wheezing or rhonchi  ABDOMEN: Soft, non-tender,  non-distended MUSCULOSKELETAL:  No edema; No deformity  SKIN: Warm and dry NEUROLOGIC:  Alert and oriented x 3  EKG:   October 14, 2017: Normal sinus rhythm at 65 beats a minute.  Frequent premature ventricular contractions..    Recent Labs: 04/01/2017: ALT 14; BUN 29; Creatinine, Ser 1.95; Potassium 5.2; Sodium 140    Lipid Panel    Component Value Date/Time   CHOL  176 04/01/2017 1549   TRIG 94 04/01/2017 1549   HDL 61 04/01/2017 1549   CHOLHDL 2.9 04/01/2017 1549   LDLCALC 96 04/01/2017 1549      Wt Readings from Last 3 Encounters:  10/14/17 169 lb 6.4 oz (76.8 kg)  04/01/17 170 lb (77.1 kg)  07/10/16 166 lb (75.3 kg)      Other studies Reviewed: Additional studies/ records that were reviewed today include: . Review of the above records demonstrates:    ASSESSMENT AND PLAN:  1. Hypertension -  BP remains elevated.    - ? May be due to prednisone therapy for the gout We will increase hydralazine to 100 mg 3 times a day.  We will start him on losartan 50 mg a day.  We will need to watch his renal function and potassium very closely. His potassium levels have been slightly elevated.  We will discontinue his potassium chloride tablet. Nurse visit BP in 2-3 weeks with BMP   2.  PVCs/ Nonsustained ventricular tachycardia-he still has multiple premature ventricular contractions.  We will get a 24-hour Holter monitor to quantitate the number of PVCs that he has.  3. Chronic combined systolic and diastolic congestive heart failure-  Stable.  Basically asymptomatic.  4.  Gout: He recently presented with an acute gout attack with pain in his large toe.  He was started on prednisone taper by urgent care.  I think a better choice would be colchicine 0.6 mg twice a day.  Ultimately, I think he will need to be started on allopurinol.  We will have him return to see his medical doctor for initiation of allopurinol if he thinks it is appropriate.  He will need to be started on  colchicine prior to being started on allopurinol.  We will do our best to limit his medications that cause gout.  Currently, he still has elevated blood pressure..  We will start him on losartan.  Losartan will actually help lower uric acid levels.   Current medicines are reviewed at length with the patient today.  The patient does not have concerns regarding medicines.  The following changes have been made:  See above.   Labs/ tests ordered today include: BMP    Orders Placed This Encounter  Procedures  . Basic Metabolic Panel (BMET)  . Basic Metabolic Panel (BMET)  . Holter monitor - 24 hour  . EKG 12-Lead    Disposition:   FU with me in 2-3 months   Signed, [name], MD  10/14/2017 5:41 PM    [facility] [address], [place], [place]  [address] Phone: [phone]; Fax: [phone]

## 2017-10-14 NOTE — Patient Instructions (Addendum)
Medication Instructions:  Your physician has recommended you make the following change in your medication:  STOP Kdur (potassium) INCREASE Hydralazine (Apresoline) to 100 mg 3 times per day START Losartan (Cozaar) 50 mg once daily START Colchicine 0.6 mg twice daily   Labwork: TODAY - basic metabolic panel   Testing/Procedures: Your physician has recommended that you wear a holter monitor. Holter monitors are medical devices that record the heart's electrical activity. Doctors most often use these monitors to diagnose arrhythmias. Arrhythmias are problems with the speed or rhythm of the heartbeat. The monitor is a small, portable device. You can wear one while you do your normal daily activities. This is usually used to diagnose what is causing palpitations/syncope (passing out).    Follow-Up: Your physician recommends that you return for a follow-up appointment on Thursday May 2 at 4:00 pm   Your physician recommends that you return for a follow-up appointment in: 2 months with Dr. Acie Fredrickson on Monday June 24   If you need a refill on your cardiac medications before your next appointment, please call your pharmacy.   Thank you for choosing [facility]! Christen Bame, RN [phone]

## 2017-10-15 LAB — BASIC METABOLIC PANEL
BUN/Creatinine Ratio: 19 (ref 10–24)
BUN: 36 mg/dL — AB (ref 8–27)
CALCIUM: 9.3 mg/dL (ref 8.6–10.2)
CHLORIDE: 97 mmol/L (ref 96–106)
CO2: 27 mmol/L (ref 20–29)
Creatinine, Ser: 1.93 mg/dL — ABNORMAL HIGH (ref 0.76–1.27)
GFR calc non Af Amer: 36 mL/min/{1.73_m2} — ABNORMAL LOW (ref >59)
GFR, EST AFRICAN AMERICAN: 42 mL/min/{1.73_m2} — AB (ref >59)
GLUCOSE: 89 mg/dL (ref 65–99)
Potassium: 4.6 mmol/L (ref 3.5–5.2)
Sodium: 137 mmol/L (ref 134–144)

## 2017-10-18 ENCOUNTER — Telehealth: Payer: Self-pay | Admitting: Cardiovascular Disease

## 2017-10-18 MED ORDER — COLCHICINE 0.6 MG PO CAPS
0.6000 mg | ORAL_CAPSULE | Freq: Two times a day (BID) | ORAL | 0 refills | Status: DC
Start: 2017-10-18 — End: 2017-11-19

## 2017-10-18 NOTE — Addendum Note (Signed)
Addended by: Aris Georgia, Anhad Sheeley L on: 10/18/2017 01:55 PM   Modules accepted: Orders

## 2017-10-18 NOTE — Telephone Encounter (Signed)
°  Pt c/o medication issue:  1. Name of Medication:  colchicine 0.6 MG tablet Take 1 tablet (0.6 mg total) by mouth 2 (two) times daily.   2. How are you currently taking this medication (dosage and times per day)? Take 1 tablet (0.6 mg total) by mouth 2 (two) times daily.  3. Are you having a reaction (difficulty breathing--STAT)? no 4. What is your medication issue?  Its too costly can it be changed

## 2017-10-18 NOTE — Telephone Encounter (Signed)
Patient stated his insurance will cover more of the cost of a capsule, instead of tablet. Changed patient's colchicine 0.6 mg to a capsule. Will forward to Dr. Acie Fredrickson, so he is aware.

## 2017-10-20 NOTE — Telephone Encounter (Signed)
Agree with changing colchicine to 0.6 mg capsule instead of tablet ( I did not specify in my recommendations)

## 2017-10-31 ENCOUNTER — Ambulatory Visit (INDEPENDENT_AMBULATORY_CARE_PROVIDER_SITE_OTHER): Payer: Managed Care, Other (non HMO)

## 2017-10-31 ENCOUNTER — Other Ambulatory Visit: Payer: Managed Care, Other (non HMO) | Admitting: *Deleted

## 2017-10-31 VITALS — BP 110/62 | HR 52 | Ht 72.0 in | Wt 164.0 lb

## 2017-10-31 DIAGNOSIS — I10 Essential (primary) hypertension: Secondary | ICD-10-CM | POA: Diagnosis not present

## 2017-10-31 DIAGNOSIS — I493 Ventricular premature depolarization: Secondary | ICD-10-CM | POA: Diagnosis not present

## 2017-10-31 DIAGNOSIS — I5022 Chronic systolic (congestive) heart failure: Secondary | ICD-10-CM

## 2017-10-31 NOTE — Progress Notes (Signed)
Reason for visit: Nurse Visit for Blood Pressure Check for recent medication change.   1.) Name of MD requesting visit: Dr. Acie Fredrickson  2.) H&P: HTN, Chronic Combined Systolic and Diastolic Congestive Heart Failure, PVCs/NSVT, Gout   3.) Patient was seen by Dr. Acie Fredrickson on 10/14/17. Patient was hypertensive 170/80 at this visit. Patient also had an acute gout attack and had been taking prednisone. Patient also was having multiple PVCs, so a holter monitor was ordered. At this visit, the patient's Hydralazine was increased to 100 mg TID, Losartan 50 mg QD was added, and K-Dur was stopped. Colchicine was also ordered at this visit.    4.) Assessment: Patient presents to visit today and denies having any chest pain, lightheadedness, dizziness, SOB, syncope, or any other complaints at this time. Patient is no longer taking prednisone since starting the colchicine. Patient has been compliant with recent medication changes as noted above. Patient's BP today is 110/62 with initial HR at 50. Rechecked HR manually and HR was 52 and irregular. Patient already has monitor in place. Patient states that he has been checking his BP at home and reports SBP 110-120s and DBP 60-70s with HR 50-65. Patient states that his HR has always been on the lower side and has been irregular for a long time. He states that he can occasionally feel an extra beat here and there. Patient had a repeat BMET drawn today for recent medication change.  5.) Plan per MD: Reviewed with Dr. Acie Fredrickson. No changes at this time. Instructed patient to continue current medications and continue to wear his heart monitor. Instructed patient to continue to avoid salt in his diet. Instructed for patient to let us know if he develops any symptoms such as lightheadedness, dizziness, hypotension, syncope, or any other symptoms. Patient verbalized understanding and was in agreement with this plan.

## 2017-11-01 LAB — BASIC METABOLIC PANEL
BUN/Creatinine Ratio: 13 (ref 10–24)
BUN: 28 mg/dL — AB (ref 8–27)
CO2: 28 mmol/L (ref 20–29)
CREATININE: 2.23 mg/dL — AB (ref 0.76–1.27)
Calcium: 9.4 mg/dL (ref 8.6–10.2)
Chloride: 102 mmol/L (ref 96–106)
GFR calc Af Amer: 35 mL/min/{1.73_m2} — ABNORMAL LOW (ref >59)
GFR calc non Af Amer: 30 mL/min/{1.73_m2} — ABNORMAL LOW (ref >59)
GLUCOSE: 77 mg/dL (ref 65–99)
Potassium: 4.2 mmol/L (ref 3.5–5.2)
Sodium: 142 mmol/L (ref 134–144)

## 2017-11-19 ENCOUNTER — Other Ambulatory Visit: Payer: Self-pay | Admitting: Cardiovascular Disease

## 2017-11-20 ENCOUNTER — Ambulatory Visit: Payer: 59 | Admitting: Cardiovascular Disease

## 2017-12-23 ENCOUNTER — Ambulatory Visit: Payer: Managed Care, Other (non HMO) | Admitting: Cardiovascular Disease

## 2017-12-24 ENCOUNTER — Encounter: Payer: Self-pay | Admitting: Cardiology

## 2017-12-24 ENCOUNTER — Ambulatory Visit (INDEPENDENT_AMBULATORY_CARE_PROVIDER_SITE_OTHER): Payer: Managed Care, Other (non HMO) | Admitting: Cardiology

## 2017-12-24 VITALS — BP 142/80 | HR 76 | Ht 72.0 in | Wt 168.8 lb

## 2017-12-24 DIAGNOSIS — I428 Other cardiomyopathies: Secondary | ICD-10-CM | POA: Diagnosis not present

## 2017-12-24 DIAGNOSIS — I4892 Unspecified atrial flutter: Secondary | ICD-10-CM | POA: Diagnosis not present

## 2017-12-24 DIAGNOSIS — I10 Essential (primary) hypertension: Secondary | ICD-10-CM

## 2017-12-24 DIAGNOSIS — I493 Ventricular premature depolarization: Secondary | ICD-10-CM

## 2017-12-24 DIAGNOSIS — I498 Other specified cardiac arrhythmias: Secondary | ICD-10-CM

## 2017-12-24 MED ORDER — LOSARTAN POTASSIUM 100 MG PO TABS: 100.0000 mg | ORAL_TABLET | Freq: Every day | ORAL | 2 refills | Status: DC

## 2017-12-24 MED ORDER — LOSARTAN POTASSIUM 50 MG PO TABS: 50.0000 mg | ORAL_TABLET | Freq: Every day | ORAL | 1 refills | Status: DC

## 2017-12-24 NOTE — Progress Notes (Signed)
Electrophysiology Office Note   Date:  12/24/2017   ID:  Terry BOGUS Sr., DOB 1955/12/04, MRN [id number]  PCP:  Terry Frees, MD  Cardiologist:  Terry Moss Primary Electrophysiologist:  Terry Estala Meredith Leeds, MD    Chief Complaint  Patient presents with  . Advice Only    PVC's     History of Present Illness: Terry R Ronney [employer]. is a 62 y.o. male who is being seen today for the evaluation of PVCs at the request of Terry Moss. Presenting today for electrophysiology evaluation.  He has a history of hypertension, chronic systolic heart failure, and nonsustained ventricular tachycardia/PVCs.  Today, he denies symptoms of palpitations, chest pain, shortness of breath, orthopnea, PND, lower extremity edema, claudication, dizziness, presyncope, syncope, bleeding, or neurologic sequela. The patient is tolerating medications without difficulties.  Overall it appears that he is minimally symptomatic.  He is able to exercise without much restriction.  He does say that when he drinks cold water, he notes that his heart rate is slow for about 30 minutes.  This does not give him any other symptoms.   Past Medical History:  Diagnosis Date  . Anemia   . Cardiomegaly   . Hypertension   . Sinus complaint    Past Surgical History:  Procedure Laterality Date  . pleurisy  1995   Right lung surgery      Current Outpatient Medications  Medication Sig Dispense Refill  . DHA-EPA-Vit B6-B12-Folic Acid (CARDIOVID PLUS PO) Take 1 tablet by mouth 2 (two) times daily.    . furosemide (LASIX) 40 MG tablet TAKE 1 TABLET(40 MG) BY MOUTH DAILY 90 tablet 3  . hydrALAZINE (APRESOLINE) 100 MG tablet Take 1 tablet (100 mg total) by mouth 3 (three) times daily. 270 tablet 3  . isosorbide dinitrate (ISORDIL) 20 MG tablet TAKE 1 TABLET BY MOUTH THREE TIMES DAILY 270 tablet 1  . OVER THE COUNTER MEDICATION Take 2 tablets by mouth 3 (three) times daily. Beet Root (Helps support Blood Pressure)    . OVER THE  COUNTER MEDICATION Take 1 capsule by mouth daily. Albaplex ( support kidney function)    . Phosphatidylserine 100 MG CAPS Take 1 capsule by mouth daily. Aids brain function    . Red Yeast Rice Extract (RED YEAST RICE PO) Take 1 capsule by mouth daily.    Marland Kitchen spironolactone (ALDACTONE) 25 MG tablet Take 1 tablet (25 mg total) by mouth daily. 90 tablet 2  . losartan (COZAAR) 50 MG tablet Take 1 tablet (50 mg total) by mouth daily. 90 tablet 1   No current facility-administered medications for this visit.     Allergies:   Patient has no known allergies.   Social History:  The patient  reports that he has never smoked. He has never used smokeless tobacco. He reports that he does not drink alcohol or use drugs.   Family History:  The patient's family history includes Heart attack in his brother; Heart disease in his brother, father, and sister; Hypertension in his mother; Stroke in his brother, father, and mother.    ROS:  Please see the history of present illness.   Otherwise, review of systems is positive for palpitations, constipation, back pain, dizziness.   All other systems are reviewed and negative.    PHYSICAL EXAM: VS:  BP (!) 142/80   Pulse 76   Ht 6' (1.829 m)   Wt 168 lb 12.8 oz (76.6 kg)   BMI 22.89 kg/m  , BMI Body mass index  is 22.89 kg/m. GEN: Well nourished, well developed, in no acute distress  HEENT: normal  Neck: no JVD, carotid bruits, or masses Cardiac: iRRR; no murmurs, rubs, or gallops,no edema  Respiratory:  clear to auscultation bilaterally, normal work of breathing GI: soft, nontender, nondistended, + BS MS: no deformity or atrophy  Skin: warm and dry Neuro:  Strength and sensation are intact Psych: euthymic mood, full affect  EKG:  EKG is ordered today. Personal review of the ekg ordered shows sinus rhythm with occasional junctional beats and APCs, rate 76, LVH with QRS widening   Recent Labs: 04/01/2017: ALT 14 10/31/2017: BUN 28; Creatinine, Ser 2.23;  Potassium 4.2; Sodium 142    Lipid Panel     Component Value Date/Time   CHOL 176 04/01/2017 1549   TRIG 94 04/01/2017 1549   HDL 61 04/01/2017 1549   CHOLHDL 2.9 04/01/2017 1549   LDLCALC 96 04/01/2017 1549     Wt Readings from Last 3 Encounters:  12/24/17 168 lb 12.8 oz (76.6 kg)  10/31/17 164 lb (74.4 kg)  10/14/17 169 lb 6.4 oz (76.8 kg)      Other studies Reviewed: Additional studies/ records that were reviewed today include: TTE 11/22/14  Review of the above records today demonstrates:  - Left ventricle: The cavity size was mildly dilated. Systolic   function was mildly reduced. The estimated ejection fraction was   in the range of 45% to 50%. Mild diffuse hypokinesis with no   identifiable regional variations. Features are consistent with a   pseudonormal left ventricular filling pattern, with concomitant   abnormal relaxation and increased filling pressure (grade 2   diastolic dysfunction). - Atrial septum: No defect or patent foramen ovale was identified.  Holter 11/11/17 - personally reviewed  Sinus rhythm /sinus bradycardia  Frequent PVCs - PVC burden - 16%  Single episode of paroxysmal atrial flutter  ASSESSMENT AND PLAN:  1.  Chronic systolic heart failure: Most recent ejection fraction at 45 to 50%.  He is having up to 16% PVCs which could cause a change.  We Terry Moss repeat his echo since he has not had one since 2016 for further evaluation.  2.  PVCs: Having a high burden at 16%.  At this point, he is minimally symptomatic.  I do think, though, that he may require therapy.  This is complicated by the fact that he has obvious junctional rhythm on his EKG with intermittent sinus arrest, and PACs.  We Terry Moss get an echo for further determination.  3.  Hypertension: Mildly elevated today, but has been high past.  We Terry Moss increase his losartan to 100 mg.  4.  Junctional rhythm: Has evidence of sinus arrest as well as PACs.  We Terry Moss hold off on further therapy and  plan to hold rate controlling therapy.  5.  Atrial flutter: Based on his EKG, he does have significant conduction issues in his atria.  He has junctional rhythm, and was found to have atrial flutter on his Holter monitor.  I Olga Seyler work to get a more accurate flutter burden to determine if he would need anticoagulation.  He does have a low stroke risk.  This patients CHA2DS2-VASc Score and unadjusted Ischemic Stroke Rate (% per year) is equal to 2.2 % stroke rate/year from a score of 2  Above score calculated as 1 point each if present [CHF, HTN, DM, Vascular=MI/PAD/Aortic Plaque, Age if 65-74, or Male] Above score calculated as 2 points each if present [Age > 75, or Stroke/TIA/TE]  Current medicines are reviewed at length with the patient today.   The patient does not have concerns regarding his medicines.  The following changes were made today:  none  Labs/ tests ordered today include:  Orders Placed This Encounter  Procedures  . EKG 12-Lead  . ECHOCARDIOGRAM COMPLETE   Case discussed with primary cardiology  Disposition:   FU with Kimbrely Buckel 3 months  Signed, Sherryl Valido Meredith Leeds, MD  12/24/2017 4:05 PM     [address] [address] [phone] (office) [phone] (fax)

## 2017-12-24 NOTE — Patient Instructions (Addendum)
Medication Instructions:  Your physician recommends that you continue on your current medications as directed. Please refer to the Current Medication list given to you today.  Labwork: None ordered  Testing/Procedures: Your physician has requested that you have an echocardiogram. Echocardiography is a painless test that uses sound waves to create images of your heart. It provides your doctor with information about the size and shape of your heart and how well your heart's chambers and valves are working. This procedure takes approximately one hour. There are no restrictions for this procedure.  Follow-Up: Your physician wants you to follow-up in: 6 months with Dr. Curt Bears.  You will receive a reminder letter in the mail two months in advance. If you don't receive a letter, please call our office to schedule the follow-up appointment.   * If you need a refill on your cardiac medications before your next appointment, please call your pharmacy.   *Please note that any paperwork needing to be filled out by the provider will need to be addressed at the front desk prior to seeing the provider. Please note that any FMLA, disability or other documents regarding health condition is subject to a $25.00 charge that must be received prior to completion of paperwork in the form of a money order or check.  Thank you for choosing [facility]!!   Trinidad Curet, RN [phone]   Please keep track of your blood pressures at home.  Call the office if you begin to experience numbers higher than 140s/90s.

## 2017-12-31 ENCOUNTER — Telehealth: Payer: Self-pay | Admitting: Cardiology

## 2017-12-31 ENCOUNTER — Other Ambulatory Visit (HOSPITAL_COMMUNITY): Payer: Managed Care, Other (non HMO)

## 2017-12-31 DIAGNOSIS — R0989 Other specified symptoms and signs involving the circulatory and respiratory systems: Secondary | ICD-10-CM

## 2017-12-31 NOTE — Telephone Encounter (Signed)
New Message  Patient is calling wanting message sent to Dr. Macky Lower nurse.   Pt c/o BP issue:  1. What are your last 5 BP readings 118/60 HR was 50 06/26 110/71 HR 61 06/27 126/69 HR 58 06/28/141/70 HR 46 06/29/ 124/63 HR 50 06/30 127/66 hr 51 07/01 110/58 HR 49  2. Are you having any other symptoms (ex. Dizziness, headache, blurred vision, passed out) N/A  3. What is your medication issue No problem

## 2018-01-03 NOTE — Telephone Encounter (Signed)
Follow up ° °Pt returning call for nurse °

## 2018-01-06 NOTE — Telephone Encounter (Signed)
Pt calling and stating he is returning your call. Please call pt.

## 2018-01-07 NOTE — Telephone Encounter (Signed)
Discussed BPs w/ pt.  Informed that no changes need to made at this time and to continue to monitor. Reports HRs are normal for him and states it has been low for years.  Denies any symptoms w/ low HRs. Advised to keep f/u w/ Dr. Acie Fredrickson next week.  Pt also tells me that he missed an appt last week.  Informed him that he missed his echo appt and that office would call him to reschedule. Message sent to echo scheduler.  Patient verbalized understanding and agreeable to plan.

## 2018-01-13 ENCOUNTER — Other Ambulatory Visit: Payer: Self-pay

## 2018-01-13 ENCOUNTER — Ambulatory Visit (HOSPITAL_COMMUNITY): Payer: Managed Care, Other (non HMO) | Attending: Cardiovascular Disease

## 2018-01-13 DIAGNOSIS — I110 Hypertensive heart disease with heart failure: Secondary | ICD-10-CM | POA: Diagnosis not present

## 2018-01-13 DIAGNOSIS — I509 Heart failure, unspecified: Secondary | ICD-10-CM | POA: Insufficient documentation

## 2018-01-13 DIAGNOSIS — I493 Ventricular premature depolarization: Secondary | ICD-10-CM

## 2018-01-14 ENCOUNTER — Ambulatory Visit: Payer: Managed Care, Other (non HMO) | Admitting: Cardiovascular Disease

## 2018-01-15 ENCOUNTER — Encounter: Payer: Self-pay | Admitting: Cardiovascular Disease

## 2018-04-29 ENCOUNTER — Telehealth: Payer: Self-pay

## 2018-04-29 NOTE — Telephone Encounter (Signed)
   [facility] Pre-operative Risk Assessment    Request for surgical clearance:  1. What type of surgery is being performed? Rt elbow loose body removal, possible triceps tendon repair   2. When is this surgery scheduled? TBD   3. What type of clearance is required (medical clearance vs. Pharmacy clearance to hold med vs. Both)? medical  4. Are there any medications that need to be held prior to surgery and how long? None specified   5. Practice name and name of physician performing surgery? Dr. Earlie Server.[facility]   6. What is your office phone number [phone]   7.   What is your office fax number       [phone]  8.   Anesthesia type (None, local, MAC, general) ?

## 2018-04-29 NOTE — Telephone Encounter (Signed)
   Primary Cardiologist: [name], MD  EP: Dr. Curt Bears   Chart reviewed as part of pre-operative protocol coverage. Left voice mail to call back between pre-op hours.   [place], [place] 04/29/2018, 3:29 PM

## 2018-04-29 NOTE — Telephone Encounter (Signed)
Follow up ° ° ° °Patient is returning call in reference to pre-op clearance.  °

## 2018-04-30 NOTE — Telephone Encounter (Signed)
   Primary Cardiologist:Philip Nahser, MD EP:  Will Meredith Leeds, MD   Chart reviewed as part of pre-operative protocol coverage.   Patient has a history of nonischemic cardiomyopathy, systolic heart failure, nonsustained ventricular tachycardia, chronic kidney disease, hypertension, paroxysmal atrial fibrillation/flutter.  Cardiac catheterization in 2009 demonstrated mild plaque but no significant obstructive disease.  Holter monitor in May 2019 demonstrated 16% burden of PVCs.  He was evaluated by Dr. Curt Bears in June 2019.  Follow-up echo demonstrated normal LV function with EF of 50-55%.  Of note, the patient did have an abnormal nuclear study in May 2016.  Cardiac catheterization was considered but ultimately deferred due to chronic kidney disease.  The patient has an appointment tomorrow (05/01/18) with Dr. Acie Fredrickson.  I will forward this note to Dr. Acie Fredrickson so that he can make recommendations regarding surgical risk after his evaluation tomorrow.    This note will be removed from the preop pool.   Richardson Dopp, PA-C  04/30/2018, 4:01 PM

## 2018-05-01 ENCOUNTER — Ambulatory Visit (INDEPENDENT_AMBULATORY_CARE_PROVIDER_SITE_OTHER): Payer: Managed Care, Other (non HMO) | Admitting: Cardiovascular Disease

## 2018-05-01 ENCOUNTER — Encounter: Payer: Self-pay | Admitting: Cardiovascular Disease

## 2018-05-01 VITALS — BP 118/58 | HR 48 | Ht 72.0 in | Wt 164.8 lb

## 2018-05-01 DIAGNOSIS — I5022 Chronic systolic (congestive) heart failure: Secondary | ICD-10-CM

## 2018-05-01 DIAGNOSIS — I10 Essential (primary) hypertension: Secondary | ICD-10-CM

## 2018-05-01 MED ORDER — ISOSORBIDE DINITRATE 10 MG PO TABS: 10.0000 mg | ORAL_TABLET | Freq: Three times a day (TID) | ORAL | 3 refills | Status: DC

## 2018-05-01 NOTE — Telephone Encounter (Signed)
Terry Moss is dioing well   He is at low risk for his upcoming elbow surgery     [name], MD  05/01/2018 11:26 AM    [facility],  [place], [place]  [address] Pager [phone] Phone: [phone]; Fax: [phone]

## 2018-05-01 NOTE — Patient Instructions (Addendum)
Medication Instructions:  Your physician has recommended you make the following change in your medication:   DECREASE Isordil dinitrate to 10 mg 3 times per day  If you need a refill on your cardiac medications before your next appointment, please call your pharmacy.    Lab work: None Ordered   Testing/Procedures: None Ordered    Follow-Up: At [facility], you and your health needs are our priority.  As part of our continuing mission to provide you with exceptional heart care, we have created designated Provider Care Teams.  These Care Teams include your primary Cardiologist (physician) and Advanced Practice Providers (APPs -  Physician Assistants and Nurse Practitioners) who all work together to provide you with the care you need, when you need it. You will need a follow up appointment in:  6 months.  Please call our office 2 months in advance to schedule this appointment.  You may see [name], MD or one of the following Advanced Practice Providers on your designated Care Team: Richardson Dopp, PA-C [place], [place] . Daune Perch, NP

## 2018-05-01 NOTE — Progress Notes (Signed)
Cardiology Office Note   Date:  05/01/2018   ID:  Terry Nephew Sr., DOB 03-10-1956, MRN [id number]  PCP:  Shirline Frees, MD  Cardiologist:   [name], MD   Chief Complaint  Patient presents with  . Congestive Heart Failure   Problem List: 1. Hypertension 2. Nonsustained ventricular tachycardia 3. Chronic systolic congestive heart failure-ejection fraction of 40% by stress Myoview study in 2009  EF 25-30% by echo 2016.    EF has normalized by echo in July  2019    The patient is a middle-aged gentleman with a history of hypertension,  palpitations, and nonsustained ventricular tachycardia.   The patient has been followed for many years for mildly dilated left  ventricle. He has also had some nonsustained ventricular tachycardia.  He has had numerous stress test. All of which have been negative.  Oct. 27, 2015:  Terry Moss is seen today after ~6 year absence.  He was followed for years for mild systolic CHF, and NSVT.  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 No smptoms. Had several stress tests for years from 2000 to 2009 - all of which were negative.  He exercises regularly without any difficutly.  He denies any syncope or pre-syncope.  Jan. 4, 2016: Terry Moss is seen back today for follow up of his CHF. He has had HTN for years. He was seen in Oct. After a multiple year absence.  He is on the full dose of hydralazine and imdur He eats out quite a bit.  He has significant DOE after taking the hydralazine and Imdur. Struggles while playing basketball or running  Jan. 25, 2016:  BP is much better. May be slightly too low at times. Has symptoms of orthostasis on occasion.     September 20, 2014:   Terry R Basilio Meadow. is a 62 y.o. male who presents for follow up of his CHF. We had arranged for him to have a cardiac cath but it was cancelled due to a bump in his creatinine.  He has known CKD.  Breathing is ok Still runs on a  regular basis.  Nov 08, 2014: Terry Moss continues to do very well. He still exercises on a regular basis. We originally had scheduled him for cardiac cath but canceled as of a  creatinine is still around 2.0. He denies any chest pain. His breathing is okay.   Sept. 13, 2016:   Terry Moss is doing ok Has chronic systolic CHF.  Did not have his cath due to CKD Still very active, plays basketball regularly without any symptoms .   Dec. 6, 2016:  Terry Moss has some chest pain  - center of his chest , radiated to the right side.  Felt more muscular after several hours.  Now goes into his back  Takes his BP at home.  Typically 110s- 125 range / 70-80s.  Jan. 9, 2018:  Doing well.  Still working out. Has not had the cath yet.   Echocardiogram in May, 2016 revealed an improved left ventricle systolic function with an EF of 45-50%. His previous EF had been as low as 20%. Still eating some salty foods.  Still eats out 3-4 times a week.    Oct. 1, 2018:  Terry Moss is doing well.   No CP or dyspnea..  Plays basketball twice a week .  Still eating out 3-4 times a week   October 14, 2017:  Terry Moss is seen today ,  No dyspnea or CP . Has been having some issues with gout.  BP is typically well controlled.  Is elevated today .   He has been stressed.    May 01, 2018: Terry Moss is seen today  Has had some dizziness after taking the hydralazine and Imdur - which he takes TID  His LV function has improved on this regimin Scheduled for right elbow surgery soon.    Past Medical History:  Diagnosis Date  . Anemia   . Cardiomegaly   . Hypertension   . Sinus complaint     Past Surgical History:  Procedure Laterality Date  . pleurisy  1995   Right lung surgery      Current Outpatient Medications  Medication Sig Dispense Refill  . furosemide (LASIX) 40 MG tablet TAKE 1 TABLET(40 MG) BY MOUTH DAILY 90 tablet 3  . hydrALAZINE (APRESOLINE) 100 MG tablet Take 1 tablet (100 mg total) by mouth 3  (three) times daily. 270 tablet 3  . isosorbide dinitrate (ISORDIL) 20 MG tablet TAKE 1 TABLET BY MOUTH THREE TIMES DAILY 270 tablet 1  . losartan (COZAAR) 50 MG tablet Take 1 tablet (50 mg total) by mouth daily. 90 tablet 1  . Phosphatidylserine 100 MG CAPS Take 1 capsule by mouth daily. Aids brain function    . spironolactone (ALDACTONE) 25 MG tablet Take 1 tablet (25 mg total) by mouth daily. 90 tablet 2   No current facility-administered medications for this visit.     Allergies:   Patient has no known allergies.    Social History:  The patient  reports that he has never smoked. He has never used smokeless tobacco. He reports that he does not drink alcohol or use drugs.   Family History:  The patient's family history includes Heart attack in his brother; Heart disease in his brother, father, and sister; Hypertension in his mother; Stroke in his brother, father, and mother.    ROS:   Noted in current history, all other systems are negative.   Physical Exam: Blood pressure (!) 118/58, pulse (!) 48, height 6' (1.829 m), weight 164 lb 12.8 oz (74.8 kg), SpO2 98 %.  GEN:  Well nourished, well developed in no acute distress HEENT: Normal NECK: No JVD; No carotid bruits LYMPHATICS: No lymphadenopathy CARDIAC: RRR , no murmurs, rubs, gallops RESPIRATORY:  Clear to auscultation without rales, wheezing or rhonchi  ABDOMEN: Soft, non-tender, non-distended MUSCULOSKELETAL:  No edema; No deformity  SKIN: Warm and dry NEUROLOGIC:  Alert and oriented x 3   EKG:    .    Recent Labs: 10/31/2017: BUN 28; Creatinine, Ser 2.23; Potassium 4.2; Sodium 142    Lipid Panel    Component Value Date/Time   CHOL 176 04/01/2017 1549   TRIG 94 04/01/2017 1549   HDL 61 04/01/2017 1549   CHOLHDL 2.9 04/01/2017 1549   LDLCALC 96 04/01/2017 1549      Wt Readings from Last 3 Encounters:  05/01/18 164 lb 12.8 oz (74.8 kg)  12/24/17 168 lb 12.8 oz (76.6 kg)  10/31/17 164 lb (74.4 kg)       Other studies Reviewed: Additional studies/ records that were reviewed today include: . Review of the above records demonstrates:    ASSESSMENT AND PLAN:  1. Hypertension -   BP is great .   2.  PVCs/ Nonsustained ventricular tachycardia-   3. Chronic combined systolic and diastolic congestive heart failure-  He is doing very well from a cardiac standpoint.  His ejection  fraction has normalized. Having some lightheadedness.  We will reduce his isosorbide to 10 mg 3 times a day.  He is scheduled for right elbow surgery.  He is very stable from a cardiac standpoint and he is at low risk for his upcoming elbow surgery.    4.  Gout:    Current medicines are reviewed at length with the patient today.  The patient does not have concerns regarding medicines.  The following changes have been made:  See above.   Labs/ tests ordered today include: BMP    No orders of the defined types were placed in this encounter.   Disposition:   FU with me in 6 months   Signed, [name], MD  05/01/2018 11:13 AM    [facility] [place], [place], [place]  [address] Phone: [phone]; Fax: [phone]

## 2018-05-02 HISTORY — PX: ELBOW SURGERY: SHX618

## 2018-05-20 ENCOUNTER — Other Ambulatory Visit: Payer: Self-pay | Admitting: Cardiovascular Disease

## 2018-05-26 ENCOUNTER — Telehealth: Payer: Self-pay | Admitting: Cardiology

## 2018-05-26 ENCOUNTER — Encounter: Payer: Self-pay | Admitting: Cardiology

## 2018-05-26 NOTE — Telephone Encounter (Signed)
° ° °  Please call patient to reschedule. He only wants an afternoon appt

## 2018-06-17 ENCOUNTER — Other Ambulatory Visit: Payer: Self-pay | Admitting: Nephrology

## 2018-06-17 ENCOUNTER — Encounter: Payer: Self-pay | Admitting: *Deleted

## 2018-06-17 ENCOUNTER — Ambulatory Visit: Payer: Managed Care, Other (non HMO) | Admitting: Cardiology

## 2018-06-17 DIAGNOSIS — N183 Chronic kidney disease, stage 3 unspecified: Secondary | ICD-10-CM

## 2018-06-20 ENCOUNTER — Other Ambulatory Visit: Payer: Self-pay | Admitting: Cardiovascular Disease

## 2018-06-20 DIAGNOSIS — I5022 Chronic systolic (congestive) heart failure: Secondary | ICD-10-CM

## 2018-07-01 ENCOUNTER — Encounter: Payer: Self-pay | Admitting: Cardiology

## 2018-07-01 ENCOUNTER — Ambulatory Visit (INDEPENDENT_AMBULATORY_CARE_PROVIDER_SITE_OTHER): Payer: Managed Care, Other (non HMO) | Admitting: Cardiology

## 2018-07-01 VITALS — BP 152/92 | HR 56 | Ht 72.0 in | Wt 162.0 lb

## 2018-07-01 DIAGNOSIS — R001 Bradycardia, unspecified: Secondary | ICD-10-CM

## 2018-07-01 DIAGNOSIS — I5022 Chronic systolic (congestive) heart failure: Secondary | ICD-10-CM

## 2018-07-01 DIAGNOSIS — I493 Ventricular premature depolarization: Secondary | ICD-10-CM

## 2018-07-01 DIAGNOSIS — I483 Typical atrial flutter: Secondary | ICD-10-CM | POA: Diagnosis not present

## 2018-07-01 DIAGNOSIS — I10 Essential (primary) hypertension: Secondary | ICD-10-CM | POA: Diagnosis not present

## 2018-07-01 NOTE — Progress Notes (Signed)
Electrophysiology Office Note   Date:  07/01/2018   ID:  Terry ZALESKY Sr., DOB Jul 31, 1955, MRN [id number]  PCP:  Shirline Frees, MD  Cardiologist:  Nahser Primary Electrophysiologist:  Arrington Yohe Meredith Leeds, MD    No chief complaint on file.    History of Present Illness: Terry Moss. is a 62 y.o. male who is being seen today for the evaluation of PVCs at the request of Liam Rogers. Presenting today for electrophysiology evaluation.  He has a history of hypertension, chronic systolic heart failure, and nonsustained ventricular tachycardia/PVCs.  Today, denies symptoms of palpitations, chest pain, shortness of breath, orthopnea, PND, lower extremity edema, claudication, dizziness, presyncope, syncope, bleeding, or neurologic sequela. The patient is tolerating medications without difficulties.  Overall he is feeling well.  He has no chest pain or shortness of breath.  He does not note palpitations other than when he drinks cold liquids quickly.  When he does this, he has palpitations for up to 30 minutes.  Otherwise no complaints.   Past Medical History:  Diagnosis Date  . Anemia   . Cardiomegaly   . Hypertension   . Sinus complaint    Past Surgical History:  Procedure Laterality Date  . pleurisy  1995   Right lung surgery      Current Outpatient Medications  Medication Sig Dispense Refill  . furosemide (LASIX) 40 MG tablet TAKE 1 TABLET(40 MG) BY MOUTH DAILY 90 tablet 3  . hydrALAZINE (APRESOLINE) 100 MG tablet Take 1 tablet (100 mg total) by mouth 3 (three) times daily. 270 tablet 3  . isosorbide dinitrate (ISORDIL) 10 MG tablet Take 1 tablet (10 mg total) by mouth 3 (three) times daily. 270 tablet 3  . Phosphatidylserine 100 MG CAPS Take 1 capsule by mouth daily. Aids brain function    . Probiotic Product (PROBIOTIC DAILY PO) Take by mouth. 1MD     No current facility-administered medications for this visit.     Allergies:   Patient has no known allergies.    Social History:  The patient  reports that he has never smoked. He has never used smokeless tobacco. He reports that he does not drink alcohol or use drugs.   Family History:  The patient's family history includes Heart attack in his brother; Heart disease in his brother, father, and sister; Hypertension in his mother; Stroke in his brother, father, and mother.   ROS:  Please see the history of present illness.   Otherwise, review of systems is positive for palpitations, constipation.   All other systems are reviewed and negative.   PHYSICAL EXAM: VS:  BP (!) 152/92   Pulse (!) 56   Ht 6' (1.829 m)   Wt 162 lb (73.5 kg)   BMI 21.97 kg/m  , BMI Body mass index is 21.97 kg/m. GEN: Well nourished, well developed, in no acute distress  HEENT: normal  Neck: no JVD, carotid bruits, or masses Cardiac: iRRR; no murmurs, rubs, or gallops,no edema  Respiratory:  clear to auscultation bilaterally, normal work of breathing GI: soft, nontender, nondistended, + BS MS: no deformity or atrophy  Skin: warm and dry Neuro:  Strength and sensation are intact Psych: euthymic mood, full affect  EKG:  EKG is ordered today. Personal review of the ekg ordered shows sinus rhythm, PACs, rate 56, LVH  Recent Labs: 10/31/2017: BUN 28; Creatinine, Ser 2.23; Potassium 4.2; Sodium 142    Lipid Panel     Component Value Date/Time   CHOL 176  04/01/2017 1549   TRIG 94 04/01/2017 1549   HDL 61 04/01/2017 1549   CHOLHDL 2.9 04/01/2017 1549   LDLCALC 96 04/01/2017 1549     Wt Readings from Last 3 Encounters:  07/01/18 162 lb (73.5 kg)  05/01/18 164 lb 12.8 oz (74.8 kg)  12/24/17 168 lb 12.8 oz (76.6 kg)      Other studies Reviewed: Additional studies/ records that were reviewed today include: TTE 01/13/18  Review of the above records today demonstrates:   - Left ventricle: The cavity size was normal. Wall thickness was   increased in a pattern of moderate LVH. Systolic function was   normal. The  estimated ejection fraction was in the range of 50%   to 55%. Left ventricular diastolic function parameters were   normal for the patient&'s age. - Left atrium: The atrium was moderately dilated. - Right atrium: The atrium was mildly dilated.  Holter 11/11/17 - personally reviewed  Sinus rhythm /sinus bradycardia  Frequent PVCs - PVC burden - 16%  Single episode of paroxysmal atrial flutter  ASSESSMENT AND PLAN:  1.  Chronic systolic heart failure: Repeat echo shows a low normal ejection fraction.  He is currently feeling well without symptoms.  We Jabbar Palmero make no changes at this time.  2.  PVCs: Up to 16% PVC burden on cardiac monitoring.  He is minimally symptomatic and his ejection fraction remains normal.  We Deshaun Weisinger continue to monitor.  3.  Hypertension: Elevated today but has been normal at home in the past.  Is nephrologist is handling his medications.  No changes at this time.  4.  Junctional rhythm: *We Daishawn Lauf hold off on rate control therapy.  No changes.  5.  Atrial flutter: Had one episode on his monitor.  Chads vascular is 1.  Currently not anticoagulated.  This patients CHA2DS2-VASc Score and unadjusted Ischemic Stroke Rate (% per year) is equal to 0.6 % stroke rate/year from a score of 1  Above score calculated as 1 point each if present [CHF, HTN, DM, Vascular=MI/PAD/Aortic Plaque, Age if 65-74, or Male] Above score calculated as 2 points each if present [Age > 75, or Stroke/TIA/TE]    Current medicines are reviewed at length with the patient today.   The patient does not have concerns regarding his medicines.  The following changes were made today: None  Labs/ tests ordered today include:  Orders Placed This Encounter  Procedures  . EKG 12-Lead    Disposition:   FU with Basim Bartnik 6 months  Signed, Jacobey Gura Meredith Leeds, MD  07/01/2018 3:22 PM     [place]  [address] [phone]  (office) [phone] (fax)

## 2018-07-15 ENCOUNTER — Other Ambulatory Visit: Payer: Managed Care, Other (non HMO)

## 2018-07-22 ENCOUNTER — Ambulatory Visit
Admission: RE | Admit: 2018-07-22 | Discharge: 2018-07-22 | Disposition: A | Discharge status: Home / Self Care | Payer: Managed Care, Other (non HMO) | Source: Ambulatory Visit | Attending: Nephrology | Admitting: Nephrology

## 2018-07-22 DIAGNOSIS — N183 Chronic kidney disease, stage 3 unspecified: Secondary | ICD-10-CM

## 2018-09-10 ENCOUNTER — Other Ambulatory Visit: Payer: Self-pay | Admitting: Family Medicine

## 2018-09-10 ENCOUNTER — Ambulatory Visit
Admission: RE | Admit: 2018-09-10 | Discharge: 2018-09-10 | Disposition: A | Discharge status: Home / Self Care | Payer: Managed Care, Other (non HMO) | Source: Ambulatory Visit | Attending: Family Medicine | Admitting: Family Medicine

## 2018-09-10 ENCOUNTER — Other Ambulatory Visit: Payer: Self-pay

## 2018-09-10 DIAGNOSIS — M79671 Pain in right foot: Secondary | ICD-10-CM

## 2018-09-14 ENCOUNTER — Other Ambulatory Visit: Payer: Self-pay | Admitting: Cardiovascular Disease

## 2018-09-25 ENCOUNTER — Other Ambulatory Visit: Payer: Self-pay | Admitting: Urology

## 2018-11-07 ENCOUNTER — Encounter (HOSPITAL_COMMUNITY)
Admission: RE | Admit: 2018-11-07 | Discharge: 2018-11-07 | Disposition: A | Discharge status: Home / Self Care | Payer: Managed Care, Other (non HMO) | Source: Ambulatory Visit | Attending: Urology | Admitting: Urology

## 2018-11-07 NOTE — Progress Notes (Signed)
01/13/2018- noted in Epic-ECHO  07/01/2018- noted in [place] note from Dr. Curt Bears and  EKG

## 2018-11-07 NOTE — Patient Instructions (Addendum)
Terry R Pilot Sr.  11/07/2018   Your procedure is scheduled on: Friday 11/14/2018  Report to [facility] Main  Entrance              Report to Short Stay at  [place]  AM    Call this number if you have problems the morning of surgery [phone]     All patients have to have a nasal COVID 19 test done 72 hours before surgery day.   On Tuesday 11-11-2018, between 9:00 am to 11:00 am, you are to drive up to [facility] center entrance, that faces [employer]  After you test you are to go straight home , quarantine.  Do not go anywhere until day of your surgery unless you have a medical appointment or emergency.                 Please follow Bowel Prep instructions from Dr. Carlton Adam office the day before surgery with a clear liquid diet!              Please use one Fleet's enema the morning of surgery!    CLEAR LIQUID DIET   Foods Allowed                                                                     Foods Excluded  Coffee and tea, regular and decaf                             liquids that you cannot  Plain Jell-O in any flavor                                             see through such as: Fruit ices (not with fruit pulp)                                     milk, soups, orange juice  Iced Popsicles                                    All solid food Carbonated beverages, regular and diet                                    Cranberry, grape and apple juices Sports drinks like Gatorade Lightly seasoned clear broth or consume(fat free) Sugar, honey syrup    _____________________________________________________________________     Remember: Do not eat food or drink liquids :After Midnight.               BRUSH YOUR TEETH MORNING OF SURGERY AND RINSE YOUR MOUTH OUT, NO CHEWING GUM CANDY OR MINTS.     Take these medicines the morning of surgery with A SIP OF WATER: Hydralazine (Apresoline), Isosorbide Dinitrate (Isordil)  You may not have any metal on your body including piercings              Do not wear jewelry,  lotions, powders or perfumes, deodorant                          Men may shave face and neck.    Do not bring valuables to the hospital. [place].  Contacts, dentures or bridgework may not be worn into surgery.    _____________________________________________________________________             [facility] - Preparing for Surgery Before surgery, you can play an important role.  Because skin is not sterile, your skin needs to be as free of germs as possible.  You can reduce the number of germs on your skin by washing with CHG (chlorahexidine gluconate) soap before surgery.  CHG is an antiseptic cleaner which kills germs and bonds with the skin to continue killing germs even after washing. Please DO NOT use if you have an allergy to CHG or antibacterial soaps.  If your skin becomes reddened/irritated stop using the CHG and inform your nurse when you arrive at Short Stay. Do not shave (including legs and underarms) for at least 48 hours prior to the first CHG shower.  You may shave your face/neck. Please follow these instructions carefully:  1.  Shower with CHG Soap the night before surgery and the  morning of Surgery.  2.  If you choose to wash your hair, wash your hair first as usual with your  normal  shampoo.  3.  After you shampoo, rinse your hair and body thoroughly to remove the  shampoo.                            4.  Use CHG as you would any other liquid soap.  You can apply chg directly  to the skin and wash                       Gently with a scrungie or clean washcloth.  5.  Apply the CHG Soap to your body ONLY FROM THE NECK DOWN.   Do not use on face/ open                           Wound or open sores. Avoid contact with eyes, ears mouth and genitals (private parts).                       Wash face,  Genitals  (private parts) with your normal soap.             6.  Wash thoroughly, paying special attention to the area where your surgery  will be performed.  7.  Thoroughly rinse your body with warm water from the neck down.  8.  DO NOT shower/wash with your normal soap after using and rinsing off  the CHG Soap.             9.  Pat yourself dry with a clean towel.            10.  Wear clean pajamas.            11.  Place clean sheets on your bed the night of your first shower and do not  sleep with pets. Day of Surgery : Do not apply any lotions/deodorants the morning of surgery.  Please wear clean clothes to the hospital/surgery center.  FAILURE TO FOLLOW THESE INSTRUCTIONS MAY RESULT IN THE CANCELLATION OF YOUR SURGERY PATIENT SIGNATURE_________________________________  NURSE SIGNATURE__________________________________  ________________________________________________________________________  WHAT IS A BLOOD TRANSFUSION? Blood Transfusion Information  A transfusion is the replacement of blood or some of its parts. Blood is made up of multiple cells which provide different functions.  Red blood cells carry oxygen and are used for blood loss replacement.  White blood cells fight against infection.  Platelets control bleeding.  Plasma helps clot blood.  Other blood products are available for specialized needs, such as hemophilia or other clotting disorders. BEFORE THE TRANSFUSION  Who gives blood for transfusions?   Healthy volunteers who are fully evaluated to make sure their blood is safe. This is blood bank blood. Transfusion therapy is the safest it has ever been in the practice of medicine. Before blood is taken from a donor, a complete history is taken to make sure that person has no history of diseases nor engages in risky social behavior (examples are intravenous drug use or sexual activity with multiple partners). The donor's travel history is screened to minimize risk of  transmitting infections, such as malaria. The donated blood is tested for signs of infectious diseases, such as HIV and hepatitis. The blood is then tested to be sure it is compatible with you in order to minimize the chance of a transfusion reaction. If you or a relative donates blood, this is often done in anticipation of surgery and is not appropriate for emergency situations. It takes many days to process the donated blood. RISKS AND COMPLICATIONS Although transfusion therapy is very safe and saves many lives, the main dangers of transfusion include:   Getting an infectious disease.  Developing a transfusion reaction. This is an allergic reaction to something in the blood you were given. Every precaution is taken to prevent this. The decision to have a blood transfusion has been considered carefully by your caregiver before blood is given. Blood is not given unless the benefits outweigh the risks. AFTER THE TRANSFUSION  Right after receiving a blood transfusion, you will usually feel much better and more energetic. This is especially true if your red blood cells have gotten low (anemic). The transfusion raises the level of the red blood cells which carry oxygen, and this usually causes an energy increase.  The nurse administering the transfusion will monitor you carefully for complications. HOME CARE INSTRUCTIONS  No special instructions are needed after a transfusion. You may find your energy is better. Speak with your caregiver about any limitations on activity for underlying diseases you may have. SEEK MEDICAL CARE IF:   Your condition is not improving after your transfusion.  You develop redness or irritation at the intravenous (IV) site. SEEK IMMEDIATE MEDICAL CARE IF:  Any of the following symptoms occur over the next 12 hours:  Shaking chills.  You have a temperature by mouth above 102 F (38.9 C), not controlled by medicine.  Chest, back, or muscle pain.  People around you  feel you are not acting correctly or are confused.  Shortness of breath or difficulty breathing.  Dizziness and fainting.  You get a rash or develop hives.  You have a decrease in urine output.  Your urine turns a dark color or changes  to pink, red, or brown. Any of the following symptoms occur over the next 10 days:  You have a temperature by mouth above 102 F (38.9 C), not controlled by medicine.  Shortness of breath.  Weakness after normal activity.  The white part of the eye turns yellow (jaundice).  You have a decrease in the amount of urine or are urinating less often.  Your urine turns a dark color or changes to pink, red, or brown. Document Released: 06/15/2000 Document Revised: 09/10/2011 Document Reviewed: 02/02/2008 [facility] Patient Information 2014 [place], [place].  _______________________________________________________________________

## 2018-11-10 NOTE — Patient Instructions (Signed)
Terry Moss.     Your procedure is scheduled on: 11-14-2018  Report to [facility]  Entrance  Report to admitting at [facility] TEST TODAY AFTER PRE OP APPOINTMENT. THIS TEST MUST BE DONE BEFORE SURGERY, COME TO [facility] ENTRANCE BETWEEN THE HOURS OF 900 AM AND 300 PM TODAY.   Call this number if you have problems the morning of surgery [phone]   Remember: Do not eat food  :After Midnight.WEDNESDAY NIGHT, CLEAR LIQUIDS ALL DAY Thursday 11-13-2018  BRUSH YOUR TEETH MORNING OF SURGERY AND RINSE YOUR MOUTH OUT, NO CHEWING GUM CANDY OR MINTS.   FOLLOW ALL BOWEL PREP INSTRUCTIONS FROM DR Louis Meckel, FLEETS ENEMA AM OF SURGERY  CLEAR LIQUID DIET   Foods Allowed                                                                     Foods Excluded  Coffee and tea, regular and decaf                             liquids that you cannot  Plain Jell-O in any flavor                                             see through such as: Fruit ices (not with fruit pulp)                                     milk, soups, orange juice  Iced Popsicles                                    All solid food Carbonated beverages, regular and diet                                    Cranberry, grape and apple juices Sports drinks like Gatorade Lightly seasoned clear broth or consume(fat free) Sugar, honey syrup  Sample Menu Breakfast                                Lunch                                     Supper Cranberry juice                    Beef broth                            Chicken broth Jell-O  Grape juice                           Apple juice Coffee or tea                        Jell-O                                      Popsicle                                                Coffee or tea                        Coffee or  tea  _____________________________________________________________________     Take these medicines the morning of surgery with A SIP OF WATER: HYDRAZALINE (APRESOLINE), ISOSORBIDE DINONITRATE (ISORDIL)                               You may not have any metal on your body including hair pins and              piercings  Do not wear jewelry, make-up, lotions, powders or perfumes, deodorant             Do not wear nail polish.  Do not shave  48 hours prior to surgery.              Men may shave face and neck.   Do not bring valuables to the hospital. [facility].  Contacts, dentures or bridgework may not be worn into surgery.  Leave suitcase in the car. After surgery it may be brought to your room.     _____________________________________________________________________             [facility] - Preparing for Surgery Before surgery, you can play an important role.  Because skin is not sterile, your skin needs to be as free of germs as possible.  You can reduce the number of germs on your skin by washing with CHG (chlorahexidine gluconate) soap before surgery.  CHG is an antiseptic cleaner which kills germs and bonds with the skin to continue killing germs even after washing. Please DO NOT use if you have an allergy to CHG or antibacterial soaps.  If your skin becomes reddened/irritated stop using the CHG and inform your nurse when you arrive at Short Stay. Do not shave (including legs and underarms) for at least 48 hours prior to the first CHG shower.  You may shave your face/neck. Please follow these instructions carefully:  1.  Shower with CHG Soap the night before surgery and the  morning of Surgery.  2.  If you choose to wash your hair, wash your hair first as usual with your  normal  shampoo.  3.  After you shampoo, rinse your hair and body thoroughly to remove the  shampoo.                           4.  Use CHG as you would any other  liquid soap.  You can apply chg directly  to the skin and wash                       Gently with a scrungie or clean washcloth.  5.  Apply the CHG Soap to your body ONLY FROM THE NECK DOWN.   Do not use on face/ open                           Wound or open sores. Avoid contact with eyes, ears mouth and genitals (private parts).                       Wash face,  Genitals (private parts) with your normal soap.             6.  Wash thoroughly, paying special attention to the area where your surgery  will be performed.  7.  Thoroughly rinse your body with warm water from the neck down.  8.  DO NOT shower/wash with your normal soap after using and rinsing off  the CHG Soap.                9.  Pat yourself dry with a clean towel.            10.  Wear clean pajamas.            11.  Place clean sheets on your bed the night of your first shower and do not  sleep with pets. Day of Surgery : Do not apply any lotions/deodorants the morning of surgery.  Please wear clean clothes to the hospital/surgery center.  FAILURE TO FOLLOW THESE INSTRUCTIONS MAY RESULT IN THE CANCELLATION OF YOUR SURGERY PATIENT SIGNATURE_________________________________  NURSE SIGNATURE__________________________________  ________________________________________________________________________

## 2018-11-11 ENCOUNTER — Encounter (HOSPITAL_COMMUNITY)
Admission: RE | Admit: 2018-11-11 | Discharge: 2018-11-11 | Disposition: A | Discharge status: Home / Self Care | Payer: Managed Care, Other (non HMO) | Source: Ambulatory Visit | Attending: Urology | Admitting: Urology

## 2018-11-11 ENCOUNTER — Other Ambulatory Visit: Payer: Self-pay

## 2018-11-11 ENCOUNTER — Encounter (HOSPITAL_COMMUNITY): Payer: Self-pay

## 2018-11-11 ENCOUNTER — Other Ambulatory Visit (HOSPITAL_COMMUNITY)
Admission: RE | Admit: 2018-11-11 | Discharge: 2018-11-11 | Disposition: A | Discharge status: Home / Self Care | Payer: Managed Care, Other (non HMO) | Source: Ambulatory Visit

## 2018-11-11 DIAGNOSIS — R9431 Abnormal electrocardiogram [ECG] [EKG]: Secondary | ICD-10-CM | POA: Insufficient documentation

## 2018-11-11 DIAGNOSIS — Z87891 Personal history of nicotine dependence: Secondary | ICD-10-CM | POA: Diagnosis not present

## 2018-11-11 DIAGNOSIS — C651 Malignant neoplasm of right renal pelvis: Secondary | ICD-10-CM | POA: Diagnosis not present

## 2018-11-11 DIAGNOSIS — D649 Anemia, unspecified: Secondary | ICD-10-CM | POA: Diagnosis not present

## 2018-11-11 DIAGNOSIS — Z79899 Other long term (current) drug therapy: Secondary | ICD-10-CM | POA: Diagnosis not present

## 2018-11-11 DIAGNOSIS — I10 Essential (primary) hypertension: Secondary | ICD-10-CM | POA: Diagnosis not present

## 2018-11-11 DIAGNOSIS — Z1159 Encounter for screening for other viral diseases: Secondary | ICD-10-CM | POA: Insufficient documentation

## 2018-11-11 DIAGNOSIS — Z01818 Encounter for other preprocedural examination: Secondary | ICD-10-CM | POA: Insufficient documentation

## 2018-11-11 DIAGNOSIS — C61 Malignant neoplasm of prostate: Secondary | ICD-10-CM | POA: Insufficient documentation

## 2018-11-11 HISTORY — DX: Disorder of kidney and ureter, unspecified: N28.9

## 2018-11-11 HISTORY — DX: Malignant (primary) neoplasm, unspecified: C80.1

## 2018-11-11 HISTORY — DX: Cardiac arrhythmia, unspecified: I49.9

## 2018-11-11 HISTORY — DX: Chronic kidney disease, unspecified: N18.9

## 2018-11-11 LAB — COMPREHENSIVE METABOLIC PANEL
ALT: 15 U/L (ref 0–44)
AST: 22 U/L (ref 15–41)
Albumin: 4.4 g/dL (ref 3.5–5.0)
Alkaline Phosphatase: 47 U/L (ref 38–126)
Anion gap: 8 (ref 5–15)
BUN: 31 mg/dL — ABNORMAL HIGH (ref 8–23)
CO2: 31 mmol/L (ref 22–32)
Calcium: 9.5 mg/dL (ref 8.9–10.3)
Chloride: 101 mmol/L (ref 98–111)
Creatinine, Ser: 2.12 mg/dL — ABNORMAL HIGH (ref 0.61–1.24)
GFR calc Af Amer: 37 mL/min — ABNORMAL LOW (ref >60)
GFR calc non Af Amer: 32 mL/min — ABNORMAL LOW (ref >60)
Glucose, Bld: 80 mg/dL (ref 70–99)
Potassium: 3.9 mmol/L (ref 3.5–5.1)
Sodium: 140 mmol/L (ref 135–145)
Total Bilirubin: 0.9 mg/dL (ref 0.3–1.2)
Total Protein: 7.2 g/dL (ref 6.5–8.1)

## 2018-11-11 LAB — CBC
HCT: 36.3 % — ABNORMAL LOW (ref 39.0–52.0)
Hemoglobin: 11.9 g/dL — ABNORMAL LOW (ref 13.0–17.0)
MCH: 29.4 pg (ref 26.0–34.0)
MCHC: 32.8 g/dL (ref 30.0–36.0)
MCV: 89.6 fL (ref 80.0–100.0)
Platelets: 167 10*3/uL (ref 150–400)
RBC: 4.05 MIL/uL — ABNORMAL LOW (ref 4.22–5.81)
RDW: 13.5 % (ref 11.5–15.5)
WBC: 3.5 10*3/uL — ABNORMAL LOW (ref 4.0–10.5)
nRBC: 0 % (ref 0.0–0.2)

## 2018-11-11 LAB — ABO/RH: ABO/RH(D): O POS

## 2018-11-11 NOTE — Progress Notes (Addendum)
PCP:  CARDIOLOGIST:dr nasher lov 05-01-18  INFO IN Epic:: ekg 11-11-18, cbc, bmet, type and screen 11-11-18 lov dr Curt Bears 07-01-18   INFO ON CHART:  BLOOD THINNERS AND LAST DOSES:none ____________________________________  PATIENT SYMPTOMS AT TIME OF PREOP:none

## 2018-11-12 LAB — NOVEL CORONAVIRUS, NAA (HOSP ORDER, SEND-OUT TO REF LAB; TAT 18-24 HRS): SARS-CoV-2, NAA: NOT DETECTED

## 2018-11-12 NOTE — Progress Notes (Signed)
Anesthesia Chart Review   Case:  [id number] Date/Time:  11/14/18 0645   Procedures:      XI ROBOTIC ASSISTED LAPAROSCOPIC RADICAL PROSTATECTOMY (N/A )     PELVIC LYMPH NODE DISSECTION (Bilateral )   Anesthesia type:  General   Pre-op diagnosis:  PROSTATE CANCER   Location:  [facility] ROOM 03 / [facility]   Surgeon:  Ardis Hughs, MD      DISCUSSION: 63 yo never smoker with h/o HTN, chronic systolic heart failure, CKD (followed by [facility], creatinine stable), PVCs, prostate cancer scheduled for above procedure 11/14/2018 with Dr. Louis Meckel.   Last seen by cardiologist, Dr. [name], 05/01/18.  Per OV note pt doing well from cardiac standpoint with normalized EF on echo.    Pt last seen by nephrologist, Dr. Harrie Jeans, 07/17/2018.  Per OV note pt stable overall with follow up in 4-5 months.   Pt can proceed with planned procedure barring acute status change.  VS: BP (!) 144/79   Pulse (!) 107   Temp 36.8 C (Oral)   Ht 6' (1.829 m)   Wt 75.3 kg   SpO2 97%   BMI 22.51 kg/m   PROVIDERS: Shirline Frees, MD is PCP   Constance Haw, MD is Electrophysiologist   [name], MD is Cardiologist   [facility] LABS: Labs reviewed: Acceptable for surgery. (all labs ordered are listed, but only abnormal results are displayed)  Labs Reviewed  CBC - Abnormal; Notable for the following components:      Result Value   WBC 3.5 (*)    RBC 4.05 (*)    Hemoglobin 11.9 (*)    HCT 36.3 (*)    All other components within normal limits  COMPREHENSIVE METABOLIC PANEL - Abnormal; Notable for the following components:   BUN 31 (*)    Creatinine, Ser 2.12 (*)    GFR calc non Af Amer 32 (*)    GFR calc Af Amer 37 (*)    All other components within normal limits  TYPE AND SCREEN  ABO/RH     IMAGES:   EKG: 11/11/2018 Rate 59 bpm Sinus bradycardia with premature atrial complexes Left axis deviation  Left ventricular hypertrophy with qrs  widening Abnormal ECG  No significant change since last tracing   CV: Echo 01/13/18 Study Conclusions  - Left ventricle: The cavity size was normal. Wall thickness was   increased in a pattern of moderate LVH. Systolic function was   normal. The estimated ejection fraction was in the range of 50%   to 55%. Left ventricular diastolic function parameters were   normal for the patient&'s age. - Left atrium: The atrium was moderately dilated. - Right atrium: The atrium was mildly dilated. Past Medical History:  Diagnosis Date  . Abnormal kidney function    sees [place] kidney x 2   . Anemia   . Cancer [facility]) dx dec 2019   prostate  . Cardiomegaly   . Chronic kidney disease   . Dysrhythmia   . Hypertension   . Pneumonia 1993  . Sinus complaint     Past Surgical History:  Procedure Laterality Date  . ELBOW SURGERY Right 05/2018  . pleurisy  1993   Right lung surgery   . PROSTATE BIOPSY      MEDICATIONS: . Carboxymethylcellul-Glycerin (CLEAR EYES FOR DRY EYES) 1-0.25 % SOLN  . furosemide (LASIX) 40 MG tablet  . hydrALAZINE (APRESOLINE) 50 MG tablet  . isosorbide dinitrate (ISORDIL) 10  MG tablet  . Misc Natural Products (COLON CLEANSE) CAPS   No current facility-administered medications for this encounter.     Maia Plan [facility] Pre-Surgical Testing [phone] 11/13/18 11:16 AM

## 2018-11-13 NOTE — Anesthesia Preprocedure Evaluation (Addendum)
Anesthesia Evaluation  Patient identified by MRN, date of birth, ID band Patient awake    Reviewed: Allergy & Precautions, NPO status , Patient's Chart, lab work & pertinent test results  History of Anesthesia Complications Negative for: history of anesthetic complications  Airway Mallampati: II  TM Distance: >3 FB Neck ROM: Full    Dental  (+) Dental Advisory Given   Pulmonary neg pulmonary ROS,    breath sounds clear to auscultation       Cardiovascular hypertension, Pt. on medications (-) angina+ dysrhythmias Atrial Fibrillation  Rhythm:Regular Rate:Normal   '19 TTE - Moderate LVH. EF 50% to 55%. LA was moderately dilated. RA was mildly dilated.    Neuro/Psych negative neurological ROS  negative psych ROS   GI/Hepatic negative GI ROS, Neg liver ROS,   Endo/Other  negative endocrine ROS  Renal/GU CRFRenal disease    Prostate cancer     Musculoskeletal negative musculoskeletal ROS (+)   Abdominal   Peds  Hematology  (+) anemia ,   Anesthesia Other Findings   Reproductive/Obstetrics                           Anesthesia Physical Anesthesia Plan  ASA: III  Anesthesia Plan: General   Post-op Pain Management:    Induction: Intravenous  PONV Risk Score and Plan: 2 and Treatment may vary due to age or medical condition, Ondansetron, Dexamethasone and Midazolam  Airway Management Planned: Oral ETT  Additional Equipment: None  Intra-op Plan:   Post-operative Plan: Extubation in OR  Informed Consent: I have reviewed the patients History and Physical, chart, labs and discussed the procedure including the risks, benefits and alternatives for the proposed anesthesia with the patient or authorized representative who has indicated his/her understanding and acceptance.     Dental advisory given  Plan Discussed with: CRNA and Anesthesiologist  Anesthesia Plan Comments:        Anesthesia Quick Evaluation

## 2018-11-14 ENCOUNTER — Encounter (HOSPITAL_COMMUNITY)
Admission: RE | Disposition: A | Discharge status: Home / Self Care | Payer: Self-pay | Source: Home / Self Care | Attending: Urology

## 2018-11-14 ENCOUNTER — Other Ambulatory Visit: Payer: Self-pay

## 2018-11-14 ENCOUNTER — Ambulatory Visit (HOSPITAL_COMMUNITY): Payer: Managed Care, Other (non HMO) | Admitting: Physician Assistant

## 2018-11-14 ENCOUNTER — Observation Stay (HOSPITAL_COMMUNITY)
Admission: RE | Admit: 2018-11-14 | Discharge: 2018-11-15 | Disposition: A | Discharge status: Home / Self Care | Payer: Managed Care, Other (non HMO) | Attending: Urology | Admitting: Urology

## 2018-11-14 ENCOUNTER — Ambulatory Visit (HOSPITAL_COMMUNITY): Payer: Managed Care, Other (non HMO) | Admitting: Certified Registered Nurse Anesthetist

## 2018-11-14 ENCOUNTER — Encounter (HOSPITAL_COMMUNITY): Payer: Self-pay | Admitting: *Deleted

## 2018-11-14 DIAGNOSIS — C651 Malignant neoplasm of right renal pelvis: Secondary | ICD-10-CM | POA: Diagnosis not present

## 2018-11-14 DIAGNOSIS — D649 Anemia, unspecified: Secondary | ICD-10-CM | POA: Insufficient documentation

## 2018-11-14 DIAGNOSIS — I10 Essential (primary) hypertension: Secondary | ICD-10-CM | POA: Insufficient documentation

## 2018-11-14 DIAGNOSIS — Z87891 Personal history of nicotine dependence: Secondary | ICD-10-CM | POA: Insufficient documentation

## 2018-11-14 DIAGNOSIS — Z1159 Encounter for screening for other viral diseases: Secondary | ICD-10-CM | POA: Insufficient documentation

## 2018-11-14 DIAGNOSIS — Z79899 Other long term (current) drug therapy: Secondary | ICD-10-CM | POA: Insufficient documentation

## 2018-11-14 DIAGNOSIS — C61 Malignant neoplasm of prostate: Secondary | ICD-10-CM | POA: Diagnosis present

## 2018-11-14 HISTORY — PX: ROBOT ASSISTED LAPAROSCOPIC RADICAL PROSTATECTOMY: SHX5141

## 2018-11-14 HISTORY — PX: PELVIC LYMPH NODE DISSECTION: SHX6543

## 2018-11-14 LAB — TYPE AND SCREEN
ABO/RH(D): O POS
Antibody Screen: NEGATIVE

## 2018-11-14 LAB — HEMOGLOBIN AND HEMATOCRIT, BLOOD
HCT: 33.9 % — ABNORMAL LOW (ref 39.0–52.0)
Hemoglobin: 11 g/dL — ABNORMAL LOW (ref 13.0–17.0)

## 2018-11-14 SURGERY — PROSTATECTOMY, RADICAL, ROBOT-ASSISTED, LAPAROSCOPIC
Anesthesia: General

## 2018-11-14 MED ORDER — BELLADONNA ALKALOIDS-OPIUM 16.2-60 MG RE SUPP: 1.0000 | Freq: Four times a day (QID) | RECTAL | Status: DC | PRN

## 2018-11-14 MED ORDER — HYDRALAZINE HCL 50 MG PO TABS
50.0000 mg | ORAL_TABLET | Freq: Three times a day (TID) | ORAL | Status: DC
  Administered 2018-11-14 – 2018-11-15 (×4): 50 mg via ORAL
  Filled 2018-11-14 (×4): qty 1

## 2018-11-14 MED ORDER — ROCURONIUM BROMIDE 10 MG/ML (PF) SYRINGE
PREFILLED_SYRINGE | INTRAVENOUS | Status: AC
  Filled 2018-11-14: qty 10

## 2018-11-14 MED ORDER — DOCUSATE SODIUM 100 MG PO CAPS
100.0000 mg | ORAL_CAPSULE | Freq: Two times a day (BID) | ORAL | Status: DC
  Administered 2018-11-14 – 2018-11-15 (×2): 100 mg via ORAL
  Filled 2018-11-14 (×2): qty 1

## 2018-11-14 MED ORDER — ROCURONIUM BROMIDE 10 MG/ML (PF) SYRINGE
PREFILLED_SYRINGE | INTRAVENOUS | Status: DC | PRN
  Administered 2018-11-14: 10 mg via INTRAVENOUS
  Administered 2018-11-14: 20 mg via INTRAVENOUS
  Administered 2018-11-14: 10 mg via INTRAVENOUS
  Administered 2018-11-14: 60 mg via INTRAVENOUS

## 2018-11-14 MED ORDER — ONDANSETRON HCL 4 MG/2ML IJ SOLN
INTRAMUSCULAR | Status: DC | PRN
  Administered 2018-11-14: 4 mg via INTRAVENOUS

## 2018-11-14 MED ORDER — FENTANYL CITRATE (PF) 100 MCG/2ML IJ SOLN: 25.0000 ug | INTRAMUSCULAR | Status: DC | PRN

## 2018-11-14 MED ORDER — ISOSORBIDE DINITRATE 10 MG PO TABS
10.0000 mg | ORAL_TABLET | Freq: Three times a day (TID) | ORAL | Status: DC
  Administered 2018-11-14 – 2018-11-15 (×4): 10 mg via ORAL
  Filled 2018-11-14 (×4): qty 1

## 2018-11-14 MED ORDER — DEXTROSE-NACL 5-0.45 % IV SOLN
INTRAVENOUS | Status: DC
  Administered 2018-11-14 – 2018-11-15 (×3): via INTRAVENOUS

## 2018-11-14 MED ORDER — ACETAMINOPHEN 10 MG/ML IV SOLN
1000.0000 mg | Freq: Four times a day (QID) | INTRAVENOUS | Status: AC
  Administered 2018-11-14 – 2018-11-15 (×4): 1000 mg via INTRAVENOUS
  Filled 2018-11-14 (×4): qty 100

## 2018-11-14 MED ORDER — BACITRACIN-NEOMYCIN-POLYMYXIN 400-5-5000 EX OINT: 1.0000 "application " | TOPICAL_OINTMENT | Freq: Three times a day (TID) | CUTANEOUS | Status: DC | PRN

## 2018-11-14 MED ORDER — BUPIVACAINE LIPOSOME 1.3 % IJ SUSP
20.0000 mL | Freq: Once | INTRAMUSCULAR | Status: AC
  Administered 2018-11-14: 11:00:00 20 mL
  Filled 2018-11-14: qty 20

## 2018-11-14 MED ORDER — ONDANSETRON HCL 4 MG/2ML IJ SOLN: 4.0000 mg | INTRAMUSCULAR | Status: DC | PRN

## 2018-11-14 MED ORDER — OXYCODONE HCL 5 MG PO TABS: 5.0000 mg | ORAL_TABLET | Freq: Once | ORAL | Status: DC | PRN

## 2018-11-14 MED ORDER — LIDOCAINE 2% (20 MG/ML) 5 ML SYRINGE
INTRAMUSCULAR | Status: AC
  Filled 2018-11-14: qty 5

## 2018-11-14 MED ORDER — HYDROMORPHONE HCL 1 MG/ML IJ SOLN
0.5000 mg | INTRAMUSCULAR | Status: DC | PRN
  Administered 2018-11-14: 1 mg via INTRAVENOUS
  Filled 2018-11-14: qty 1

## 2018-11-14 MED ORDER — LACTATED RINGERS IR SOLN
Status: DC | PRN
  Administered 2018-11-14: 1000 mL

## 2018-11-14 MED ORDER — SUCCINYLCHOLINE CHLORIDE 200 MG/10ML IV SOSY
PREFILLED_SYRINGE | INTRAVENOUS | Status: AC
  Filled 2018-11-14: qty 10

## 2018-11-14 MED ORDER — FENTANYL CITRATE (PF) 250 MCG/5ML IJ SOLN
INTRAMUSCULAR | Status: DC | PRN
  Administered 2018-11-14 (×2): 50 ug via INTRAVENOUS
  Administered 2018-11-14: 100 ug via INTRAVENOUS

## 2018-11-14 MED ORDER — STERILE WATER FOR IRRIGATION IR SOLN
Status: DC | PRN
  Administered 2018-11-14: 1000 mL

## 2018-11-14 MED ORDER — SULFAMETHOXAZOLE-TRIMETHOPRIM 800-160 MG PO TABS: 1.0000 | ORAL_TABLET | Freq: Two times a day (BID) | ORAL | 0 refills | Status: DC

## 2018-11-14 MED ORDER — TRAMADOL HCL 50 MG PO TABS
50.0000 mg | ORAL_TABLET | Freq: Four times a day (QID) | ORAL | Status: DC | PRN
  Administered 2018-11-14 – 2018-11-15 (×2): 50 mg via ORAL
  Filled 2018-11-14 (×2): qty 1

## 2018-11-14 MED ORDER — BUPIVACAINE-EPINEPHRINE 0.5% -1:200000 IJ SOLN
INTRAMUSCULAR | Status: AC
  Filled 2018-11-14: qty 1

## 2018-11-14 MED ORDER — CEFAZOLIN SODIUM-DEXTROSE 2-4 GM/100ML-% IV SOLN
2.0000 g | INTRAVENOUS | Status: AC
  Administered 2018-11-14: 08:00:00 2 g via INTRAVENOUS
  Filled 2018-11-14: qty 100

## 2018-11-14 MED ORDER — EPHEDRINE SULFATE-NACL 50-0.9 MG/10ML-% IV SOSY
PREFILLED_SYRINGE | INTRAVENOUS | Status: DC | PRN
  Administered 2018-11-14: 10 mg via INTRAVENOUS
  Administered 2018-11-14: 15 mg via INTRAVENOUS
  Administered 2018-11-14: 10 mg via INTRAVENOUS

## 2018-11-14 MED ORDER — PROPOFOL 10 MG/ML IV BOLUS
INTRAVENOUS | Status: DC | PRN
  Administered 2018-11-14: 130 mg via INTRAVENOUS

## 2018-11-14 MED ORDER — MIDAZOLAM HCL 2 MG/2ML IJ SOLN
INTRAMUSCULAR | Status: AC
  Filled 2018-11-14: qty 2

## 2018-11-14 MED ORDER — FLEET ENEMA 7-19 GM/118ML RE ENEM
1.0000 | ENEMA | Freq: Once | RECTAL | Status: DC
  Filled 2018-11-14: qty 1

## 2018-11-14 MED ORDER — PROMETHAZINE HCL 25 MG/ML IJ SOLN: 6.2500 mg | INTRAMUSCULAR | Status: DC | PRN

## 2018-11-14 MED ORDER — OXYCODONE HCL 5 MG/5ML PO SOLN: 5.0000 mg | Freq: Once | ORAL | Status: DC | PRN

## 2018-11-14 MED ORDER — DIPHENHYDRAMINE HCL 50 MG/ML IJ SOLN: 12.5000 mg | Freq: Four times a day (QID) | INTRAMUSCULAR | Status: DC | PRN

## 2018-11-14 MED ORDER — SUCCINYLCHOLINE CHLORIDE 200 MG/10ML IV SOSY
PREFILLED_SYRINGE | INTRAVENOUS | Status: DC | PRN
  Administered 2018-11-14: 120 mg via INTRAVENOUS

## 2018-11-14 MED ORDER — BUPIVACAINE-EPINEPHRINE 0.5% -1:200000 IJ SOLN
INTRAMUSCULAR | Status: DC | PRN
  Administered 2018-11-14: 50 mL

## 2018-11-14 MED ORDER — FENTANYL CITRATE (PF) 250 MCG/5ML IJ SOLN
INTRAMUSCULAR | Status: AC
  Filled 2018-11-14: qty 5

## 2018-11-14 MED ORDER — SODIUM CHLORIDE 0.9 % IV BOLUS
1000.0000 mL | Freq: Once | INTRAVENOUS | Status: AC
  Administered 2018-11-14: 1000 mL via INTRAVENOUS

## 2018-11-14 MED ORDER — TRAMADOL HCL 50 MG PO TABS: 50.0000 mg | ORAL_TABLET | Freq: Four times a day (QID) | ORAL | 0 refills | Status: DC | PRN

## 2018-11-14 MED ORDER — EPHEDRINE 5 MG/ML INJ
INTRAVENOUS | Status: AC
  Filled 2018-11-14: qty 10

## 2018-11-14 MED ORDER — DIPHENHYDRAMINE HCL 12.5 MG/5ML PO ELIX: 12.5000 mg | ORAL_SOLUTION | Freq: Four times a day (QID) | ORAL | Status: DC | PRN

## 2018-11-14 MED ORDER — SODIUM CHLORIDE (PF) 0.9 % IJ SOLN
INTRAMUSCULAR | Status: AC
  Filled 2018-11-14: qty 20

## 2018-11-14 MED ORDER — LACTATED RINGERS IV SOLN
INTRAVENOUS | Status: DC
  Administered 2018-11-14 (×2): via INTRAVENOUS

## 2018-11-14 MED ORDER — MIDAZOLAM HCL 5 MG/5ML IJ SOLN
INTRAMUSCULAR | Status: DC | PRN
  Administered 2018-11-14: 2 mg via INTRAVENOUS

## 2018-11-14 MED ORDER — PROPOFOL 10 MG/ML IV BOLUS
INTRAVENOUS | Status: AC
  Filled 2018-11-14: qty 20

## 2018-11-14 MED ORDER — LIDOCAINE 2% (20 MG/ML) 5 ML SYRINGE
INTRAMUSCULAR | Status: DC | PRN
  Administered 2018-11-14: 60 mg via INTRAVENOUS

## 2018-11-14 SURGICAL SUPPLY — 54 items
CATH FOLEY 2WAY SLVR 18FR 30CC (CATHETERS) ×3 IMPLANT
CATH TIEMANN FOLEY 18FR 5CC (CATHETERS) ×3 IMPLANT
CHLORAPREP W/TINT 26 (MISCELLANEOUS) ×3 IMPLANT
CLIP VESOLOCK LG 6/CT PURPLE (CLIP) ×6 IMPLANT
COVER SURGICAL LIGHT HANDLE (MISCELLANEOUS) ×3 IMPLANT
COVER TIP SHEARS 8 DVNC (MISCELLANEOUS) ×2 IMPLANT
COVER TIP SHEARS 8MM DA VINCI (MISCELLANEOUS) ×1
COVER WAND RF STERILE (DRAPES) IMPLANT
CUTTER ECHEON FLEX ENDO 45 340 (ENDOMECHANICALS) ×3 IMPLANT
DECANTER SPIKE VIAL GLASS SM (MISCELLANEOUS) ×3 IMPLANT
DERMABOND ADVANCED (GAUZE/BANDAGES/DRESSINGS) ×1
DERMABOND ADVANCED .7 DNX12 (GAUZE/BANDAGES/DRESSINGS) ×2 IMPLANT
DRAPE ARM DVNC X/XI (DISPOSABLE) ×8 IMPLANT
DRAPE COLUMN DVNC XI (DISPOSABLE) ×2 IMPLANT
DRAPE DA VINCI XI ARM (DISPOSABLE) ×4
DRAPE DA VINCI XI COLUMN (DISPOSABLE) ×1
DRAPE SURG IRRIG POUCH 19X23 (DRAPES) ×3 IMPLANT
DRSG TEGADERM 4X4.75 (GAUZE/BANDAGES/DRESSINGS) ×3 IMPLANT
ELECT REM PT RETURN 15FT ADLT (MISCELLANEOUS) ×3 IMPLANT
GAUZE SPONGE 2X2 8PLY STRL LF (GAUZE/BANDAGES/DRESSINGS) IMPLANT
GLOVE BIO SURGEON STRL SZ 6.5 (GLOVE) ×3 IMPLANT
GLOVE BIOGEL M STRL SZ7.5 (GLOVE) ×6 IMPLANT
GOWN STRL REUS W/TWL LRG LVL3 (GOWN DISPOSABLE) ×3 IMPLANT
GOWN STRL REUS W/TWL XL LVL3 (GOWN DISPOSABLE) ×6 IMPLANT
HOLDER FOLEY CATH W/STRAP (MISCELLANEOUS) ×3 IMPLANT
IRRIG SUCT STRYKERFLOW 2 WTIP (MISCELLANEOUS) ×3
IRRIGATION SUCT STRKRFLW 2 WTP (MISCELLANEOUS) ×2 IMPLANT
IV LACTATED RINGERS 1000ML (IV SOLUTION) ×3 IMPLANT
KIT TURNOVER KIT A (KITS) ×1 IMPLANT
PACK ROBOT UROLOGY CUSTOM (CUSTOM PROCEDURE TRAY) ×3 IMPLANT
PAD POSITIONING PINK XL (MISCELLANEOUS) ×3 IMPLANT
PORT ACCESS TROCAR AIRSEAL 12 (TROCAR) ×2 IMPLANT
PORT ACCESS TROCAR AIRSEAL 5M (TROCAR) ×1
RELOAD STAPLE 45 4.1 GRN THCK (STAPLE) ×2 IMPLANT
SEAL CANN UNIV 5-8 DVNC XI (MISCELLANEOUS) ×8 IMPLANT
SEAL XI 5MM-8MM UNIVERSAL (MISCELLANEOUS) ×4
SET TRI-LUMEN FLTR TB AIRSEAL (TUBING) ×3 IMPLANT
SOLUTION ELECTROLUBE (MISCELLANEOUS) ×3 IMPLANT
SPONGE GAUZE 2X2 STER 10/PKG (GAUZE/BANDAGES/DRESSINGS)
STAPLE RELOAD 45 GRN (STAPLE) ×2 IMPLANT
STAPLE RELOAD 45MM GREEN (STAPLE) ×1
SUT ETHILON 3 0 PS 1 (SUTURE) ×3 IMPLANT
SUT MNCRL AB 4-0 PS2 18 (SUTURE) ×6 IMPLANT
SUT V-LOC BARB 180 2/0GR6 GS22 (SUTURE) ×3
SUT VIC AB 0 CT1 27 (SUTURE) ×1
SUT VIC AB 0 CT1 27XBRD ANTBC (SUTURE) ×2 IMPLANT
SUT VIC AB 2-0 SH 27 (SUTURE) ×1
SUT VIC AB 2-0 SH 27X BRD (SUTURE) ×2 IMPLANT
SUT VICRYL 0 UR6 27IN ABS (SUTURE) ×6 IMPLANT
SUT VLOC BARB 180 ABS3/0GR12 (SUTURE) ×6
SUTURE V-LC BRB 180 2/0GR6GS22 (SUTURE) ×2 IMPLANT
SUTURE VLOC BRB 180 ABS3/0GR12 (SUTURE) ×4 IMPLANT
TOWEL OR NON WOVEN STRL DISP B (DISPOSABLE) ×3 IMPLANT
WATER STERILE IRR 1000ML POUR (IV SOLUTION) ×3 IMPLANT

## 2018-11-14 NOTE — Progress Notes (Signed)
Dr Fransisco Beau aware of pt's BP of during [facility] stay of 160/s/90"s.  No orders received. May go to room at this time

## 2018-11-14 NOTE — H&P (Signed)
Pt presents today for pre-operative history and physical exam in anticipation of robotic assisted lap prostatectomy with bilateral pelvic lymph nodes on 11/14/18 by Dr. Louis Meckel. He is doing well and without complaint. Pt denies F/C, HA, CP, SOB, N/V, diarrhea, flank pain, hematuria, and dysuria. He has back pain due to arthritis.    HX:     CC: f/u to monitor Prostate Cancer  HPI: Terry Moss is a 63 year-old male established patient who is here for interval evaluation of his prostate cancer.  2.5.2020: TRUS/Bx performed.  PSA 7.74. Prostate volume 20 ml. PSAD 0.39.  5/12 cores revealed PCa-- 4 cores revealed GS 3+3 pattern ( right base lateral, right base medial, right mid lateral, right apex medial ).  1 core revealed GS 3+4 pattern (left apex medial).   The patient is able to obtain an erection on his own that is firm enough for penetration, however it often is interval enough for completion.   The patient has no intra-abdominal surgical history. He did have lung surgery in 1993. His past medical history is significant for an irregular heart beat and mild hypertension. The patient is otherwise in excellent shape.   The patient works for [employer] 4 hours per day. He lives alone, has a significant other.   He was diagnosed with prostate cancer in 08/06/2018. The patient's gleason score was: 3+4=7. Pretreatment PSA: 7.74.   This was drawn on 08/06/2018.   He does have problems with erections. Currently he is using no medication for his ED. His erections are satisfactory for intercouse.   The patient denies having urinary incontinence. The patient has developed urgency.     ALLERGIES: None   MEDICATIONS: Colon Cleanse  Furosemide  Hydralazine Hcl 100 mg tablet  Isosorbide     GU PSH: Prostate Needle Biopsy - 08/06/2018      PSH Notes: elbow surgery right   NON-GU PSH: Lung Surgery (Unspecified) Surgical Pathology, Gross And Microscopic Examination For Prostate Needle - 08/06/2018     GU PMH: Prostate Cancer, Low/intermediate risk prostate cancer. The patient does have a high PSA density. Relatively small gland. - 08/21/2018 Elevated PSA - 08/06/2018, (Worsening), This has a persistent upward trend and the patient needs ultrasound and biopsy, - 03/05/2018, PSA of 75.76 and a 63 year old male with a PSA baseline 12 years ago being just over 1. He has a normal feeling gland. There is not a long-standing urologic history or family history of prostate cancer. , - 2018      PMH Notes:  1898-07-02 00:00:00 - Note: Normal Routine History And Physical Adult   NON-GU PMH: Arthritis Hypercholesterolemia Hypertension    FAMILY HISTORY: 5 sons - Son Death - Mother, Father Heart Disease - Brother, Father, Sister Hypertension - Mother stroke - Father, Mother    Notes: mother passed @ age 20-stroke  father passed @ age 65-stroke   SOCIAL HISTORY: Marital Status: Single Preferred Language: English; Ethnicity: Not Hispanic Or Latino; Race: Black or African American Current Smoking Status: Patient does not smoke anymore. Smoked for 12 years.   Tobacco Use Assessment Completed: Used Tobacco in last 30 days? Does not use smokeless tobacco. Drinks 1 drink per week.  Does not use drugs. Does not drink caffeine. Has had a blood transfusion. 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     Notes: Red wine couple times per month  blood transfusion 1993 with lung surgery   REVIEW OF SYSTEMS:    GU Review Male:   Patient reports hard to postpone urination.  Patient denies frequent urination, burning/ pain with urination, get up at night to urinate, leakage of urine, stream starts and stops, trouble starting your stream, have to strain to urinate , erection problems, and penile pain.  Gastrointestinal (Upper):   Patient denies nausea, vomiting, and indigestion/ heartburn.  Gastrointestinal (Lower):   Patient reports constipation. Patient denies diarrhea.  Constitutional:   Patient reports fatigue.  Patient denies fever, night sweats, and weight loss.  Skin:   Patient denies skin rash/ lesion and itching.  Eyes:   Patient denies blurred vision and double vision.  Ears/ Nose/ Throat:   Patient denies sore throat and sinus problems.  Hematologic/Lymphatic:   Patient denies swollen glands and easy bruising.  Cardiovascular:   Patient denies leg swelling and chest pains.  Respiratory:   Patient denies cough and shortness of breath.  Endocrine:   Patient denies excessive thirst.  Musculoskeletal:   Patient reports back pain and joint pain.   Neurological:   Patient denies headaches and dizziness.  Psychologic:   Patient denies anxiety and depression.   VITAL SIGNS:      11/04/2018 02:50 PM  Weight 168 lb / 76.2 kg  Height 72 in / 182.88 cm  BP 173/95 mmHg  Pulse 73 /min  Temperature 98.0 F / 36.6 C  BMI 22.8 kg/m   MULTI-SYSTEM PHYSICAL EXAMINATION:    Constitutional: Well-nourished. No physical deformities. Normally developed. Good grooming.  Neck: Neck symmetrical, not swollen. Normal tracheal position.  Respiratory: Normal breath sounds. No labored breathing, no use of accessory muscles.   Cardiovascular: Abnormal heart rhythm. Normal temperature, normal extremity pulses, no swelling, no varicosities.   Lymphatic: No enlargement of neck, axillae, groin.  Skin: No paleness, no jaundice, no cyanosis. No lesion, no ulcer, no rash.  Neurologic / Psychiatric: Oriented to time, oriented to place, oriented to person. No depression, no anxiety, no agitation.  Gastrointestinal: No mass, no tenderness, no rigidity, non obese abdomen.  Eyes: Normal conjunctivae. Normal eyelids.  Ears, Nose, Mouth, and Throat: Left ear no scars, no lesions, no masses. Right ear no scars, no lesions, no masses. Nose no scars, no lesions, no masses. Normal hearing. Normal lips.  Musculoskeletal: Normal gait and station of head and neck.     PAST DATA REVIEWED:  Source Of History:  Patient  Records Review:    Previous Patient Records  Urine Test Review:   Urinalysis   11/04/18  Urinalysis  Urine Appearance Clear   Urine Color Yellow   Urine Glucose Neg mg/dL  Urine Bilirubin Neg mg/dL  Urine Ketones Neg mg/dL  Urine Specific Gravity 1.020   Urine Blood Neg ery/uL  Urine pH <=5.0   Urine Protein Neg mg/dL  Urine Urobilinogen 0.2 mg/dL  Urine Nitrites Neg   Urine Leukocyte Esterase Neg leu/uL   PROCEDURES: None   ASSESSMENT:      ICD-10 Details  1 GU:   Prostate Cancer - C61    PLAN:           Schedule Return Visit/Planned Activity: Keep Scheduled Appointment - Schedule Surgery          Document Letter(s):  Created for Patient: Clinical Summary         Notes:   There are no changes in the patients history or physical exam since last evaluation by Dr. Louis Meckel. Pt is scheduled to undergo RALP with [facility] on 11/14/18.   All pt's questions were answered to the best of my ability.

## 2018-11-14 NOTE — Op Note (Signed)
Preoperative diagnosis:  1. Prostate Cancer   Postoperative diagnosis:  1. same   Procedure: 1. Robotic assisted laparoscopic radical prostatectomy 2. Bilateral pelvic lymph node dissection  Surgeon: Ardis Hughs, MD First Assistant: Debbrah Alar, PA. Resident Surgeon: Arminda Resides, MD  Anesthesia: General  Complications: None  Intraoperative findings:  1. Bilateral nerve sparing prostatectomy 2. No overt disease   EBL: Minimal  Specimens:  #1.  Prostate and seminal vesicals #2.  Bilateral pelvic lymph nodes  Indication: Terry R Marcel Sr. is a 63 y.o. patient with prostate cancer.  After reviewing the management options for treatment, he elected to proceed with the removal of his prostate. We have discussed the potential benefits and risks of the procedure, side effects of the proposed treatment, the likelihood of the patient achieving the goals of the procedure, and any potential problems that might occur during the procedure or recuperation. Informed consent has been obtained.  Description of procedure:  The patient was consented in the preoperative holding area. He was in brought back to the operating room placed the table in supine position. General anesthesia was then induced and endotracheal tube was inserted. He was then placed in dorsolithotomy position and placed in steep Trendelenburg. He was then prepped and draped in the routine sterile fashion. We, the first assistant and I, then began by making a 10 mm incision supraumbilical midline incision the skin down through into the peritoneum. Then placed a 8 mm trocar. I then inflated the abdomen and inserted the 0 robotic lens. We then placed 2 additional a 8 millimeter trochars in the patient's left lower abdomen proximally 9 cm apart and 2 trochars on the patient's right lower abdomen, one was an 8 mm trocar and the one most lateral was a 12 mm trocar which was used as the assistant port. A 5 mm trocar was placed by  triangulating the 2 right lateral ports as a second assistant port. These ports were all placed under visual guidance. Once the ports were noted to be satisfactory position the robot was docked. We started with the 0 lens, monopolar scissors in the right hand and the [place] forceps the left hand as well as a fenestrated grasper as the third arm on the left-hand side.   We, the first assistant and I,  began our dissection the posterior plane incising the peritoneum at the level of the vas deferens. Isolated the left vas deferens and dissected it proximally towards the spermatic cord for 5 cm prior to ligating it. Then used this as traction to isolate the left the seminal vesicle which was then undressed bluntly and completely dissected out, all vessels were cauterized with a combination of bipolar and the monopolar scissors. We then turned our attention to the right side and similarly dissected out the right vas deferens and seminal vesicle. Once the SVs had been freed, we turned our attention to the posterior plane and bluntly dissected the tissue between the rectum and the posterior wall of the prostate bluntly out towards the apex.    At this point the bladder was taken down starting at the urachal remnant with a combination of both blunt dissection and sharp dissection using monopolar cautery the bladder was dropped down in the usual fashion to the medial umbilical ligaments laterally and the dorsal vein of the prostate anteriorly creating our space of Retzius. We then turned our attention to the endopelvic fascia which was incised laterally starting on the patient's right-hand side the levator muscles were pushed  off the prostate laterally up towards the dorsal vein complex on the right-hand side. This process was then repeated on the left-hand side and a nice notch was created for the dorsal vein. I then used a 7mm stapler to staple the dorsal vein.   We,the first assistant and I, then located the  bladder neck at the vesicoprostatic junction and using the monopolar scissors dissected down through the perivesical tissues and the bladder neck down to the prostatic urethra. The catheter was then deflated and pulled through our urethral opening and then used to retract the prostate anteriorly for the posterior bladder neck dissection. Once through the bladder neck and into the posterior plane of the prostate, the SVs were brought through the opening. The left pedicle was then isolated and systematically ligated with Weck clips and scissors. The nerve bundle was then peeled off the posterior lateral aspect of the prostate and bluntly dissected away off the prostate. This was then repeated on the right side.    I then came down through the dorsal venous complex anteriorly down to the membranous urethra using the monopolar. Once down to the urethra, it was transected sharply and the apex of the prostate was then dissected off the levator and rectourethralis muscles. Once the apex of the prostate had been dissected free we came back to the base of the prostate and bluntly push the rectum and nerve vascular bundle off the prostate the patient's left and used clips on the patient's right to free the prostate. Once the prostate was free it was placed off to the side. The pelvis was then irrigated with normal saline and noted to be relatively hemostatic.  Attention was then turned to the right pelvic sidewall. The fibrofatty tissue between the external iliac vein, confluence of the iliac vessels, hypogastric artery, and Cooper's ligament was dissected free from the pelvic sidewall with care to preserve the obturator nerve. Weck clips were used for lymphostasis and hemostasis. An identical procedure was performed on the contralateral side and the lymphatic packets were removed for permanent pathologic analysis.  The prostate and both lymph node tissues were placed in the Endo Catch bag and the string brought to the  5 mm port.    The vesicourethral anastomosis was then completed with 2 interlocking 3-0 V. lock sutures running the anastomosis in the 6:00 position to the 12:00 position on each side and then tying it off on the top. The final catheter was then passed through the patient's urethra and into the bladder and 120 cc was instilled into the bladder to test the anastomosis. As there was no leak a 100 [place] Blake drain was passed through the left lateral port and placed around the vesicourethral anastomosis. A 12 mm assistant port on the right lateral side was then closed with 0 Vicryl with the help of the [employer] needle. The 12 mm midline infraumbilical incision was then extended another centimeter taken down and the fascia opened to remove the Endo Catch bag with the prostate specimen. The fascia was then closed with a 0 Vicryl and all skin ports were closed with 4-0 Monocryl in a subcutaneous fashion. Dermabond glue was then applied to the incisions. The drain was then secured to the skin with a 0 nylon stitch and dressing applied.   At the end of the case all laps needles and sponges had been accounted for. There no immediate complications. The patient returned to the PACU in stable condition.

## 2018-11-14 NOTE — Discharge Instructions (Signed)

## 2018-11-14 NOTE — Anesthesia Postprocedure Evaluation (Signed)
Anesthesia Post Note  Patient: Terry Nephew Sr.  Procedure(s) Performed: XI ROBOTIC ASSISTED LAPAROSCOPIC RADICAL PROSTATECTOMY (N/A ) PELVIC LYMPH NODE DISSECTION (Bilateral )     Patient location during evaluation: PACU Anesthesia Type: General Level of consciousness: awake and alert Pain management: pain level controlled Vital Signs Assessment: post-procedure vital signs reviewed and stable Respiratory status: spontaneous breathing, nonlabored ventilation, respiratory function stable and patient connected to nasal cannula oxygen Cardiovascular status: blood pressure returned to baseline and stable Postop Assessment: no apparent nausea or vomiting Anesthetic complications: no    Last Vitals:  Vitals:   11/14/18 1215 11/14/18 1230  BP: (!) 166/96 (!) 162/89  Pulse: 66 70  Resp: 19 14  Temp:    SpO2: 100% 100%    Last Pain:  Vitals:   11/14/18 1230  TempSrc:   PainSc: [place] Ryliegh Mcduffey

## 2018-11-14 NOTE — Transfer of Care (Signed)
Immediate Anesthesia Transfer of Care Note  Patient: Terry R Prange Sr.  Procedure(s) Performed: XI ROBOTIC ASSISTED LAPAROSCOPIC RADICAL PROSTATECTOMY (N/A ) PELVIC LYMPH NODE DISSECTION (Bilateral )  Patient Location: PACU  Anesthesia Type:General  Level of Consciousness: awake, alert  and oriented  Airway & Oxygen Therapy: Patient Spontanous Breathing and Patient connected to face mask oxygen  Post-op Assessment: Report given to RN and Post -op Vital signs reviewed and stable  Post vital signs: Reviewed and stable  Last Vitals:  Vitals Value Taken Time  BP 174/90 11/14/2018 11:53 AM  Temp    Pulse    Resp 13 11/14/2018 11:54 AM  SpO2    Vitals shown include unvalidated device data.  Last Pain:  Vitals:   11/14/18 0512  TempSrc: Oral         Complications: No apparent anesthesia complications

## 2018-11-14 NOTE — Anesthesia Procedure Notes (Signed)
Procedure Name: Intubation Date/Time: 11/14/2018 7:29 AM Performed by: [place] Zyron Deeley C, CRNA Pre-anesthesia Checklist: Patient identified, Emergency Drugs available, Suction available and Patient being monitored Patient Re-evaluated:Patient Re-evaluated prior to induction Oxygen Delivery Method: Circle system utilized Preoxygenation: Pre-oxygenation with 100% oxygen Induction Type: IV induction and Rapid sequence Laryngoscope Size: Mac and 4 Grade View: Grade I Tube type: Oral Tube size: 7.5 mm Number of attempts: 1 Airway Equipment and Method: Stylet and Oral airway Placement Confirmation: ETT inserted through vocal cords under direct vision,  positive ETCO2 and breath sounds checked- equal and bilateral Secured at: 22 cm Tube secured with: Tape Dental Injury: Teeth and Oropharynx as per pre-operative assessment

## 2018-11-14 NOTE — Progress Notes (Signed)
Patient ambulated 360 ft x2, activity tolerated well. Gait remains steady.

## 2018-11-14 NOTE — Interval H&P Note (Signed)
History and Physical Interval Note:  11/14/2018 7:21 AM  Terry R Hemstreet Sr.  has presented today for surgery, with the diagnosis of PROSTATE CANCER.  The various methods of treatment have been discussed with the patient and family. After consideration of risks, benefits and other options for treatment, the patient has consented to  Procedure(s): XI ROBOTIC ASSISTED LAPAROSCOPIC RADICAL PROSTATECTOMY (N/A) PELVIC LYMPH NODE DISSECTION (Bilateral) as a surgical intervention.  The patient's history has been reviewed, patient examined, no change in status, stable for surgery.  I have reviewed the patient's chart and labs.  Questions were answered to the patient's satisfaction.     Ardis Hughs

## 2018-11-15 ENCOUNTER — Encounter (HOSPITAL_COMMUNITY): Payer: Self-pay | Admitting: Urology

## 2018-11-15 DIAGNOSIS — C651 Malignant neoplasm of right renal pelvis: Secondary | ICD-10-CM | POA: Diagnosis not present

## 2018-11-15 LAB — BASIC METABOLIC PANEL
Anion gap: 9 (ref 5–15)
BUN: 21 mg/dL (ref 8–23)
CO2: 27 mmol/L (ref 22–32)
Calcium: 8.7 mg/dL — ABNORMAL LOW (ref 8.9–10.3)
Chloride: 100 mmol/L (ref 98–111)
Creatinine, Ser: 1.96 mg/dL — ABNORMAL HIGH (ref 0.61–1.24)
GFR calc Af Amer: 41 mL/min — ABNORMAL LOW (ref >60)
GFR calc non Af Amer: 35 mL/min — ABNORMAL LOW (ref >60)
Glucose, Bld: 119 mg/dL — ABNORMAL HIGH (ref 70–99)
Potassium: 3.6 mmol/L (ref 3.5–5.1)
Sodium: 136 mmol/L (ref 135–145)

## 2018-11-15 LAB — CREATININE, FLUID (PLEURAL, PERITONEAL, JP DRAINAGE): Creat, Fluid: 1.9 mg/dL

## 2018-11-15 LAB — HEMOGLOBIN AND HEMATOCRIT, BLOOD
HCT: 31.7 % — ABNORMAL LOW (ref 39.0–52.0)
HCT: 32.5 % — ABNORMAL LOW (ref 39.0–52.0)
Hemoglobin: 10 g/dL — ABNORMAL LOW (ref 13.0–17.0)
Hemoglobin: 10 g/dL — ABNORMAL LOW (ref 13.0–17.0)

## 2018-11-15 NOTE — Discharge Summary (Signed)
Physician Discharge Summary  Patient ID: Terry Nephew Sr. MRN: [id number] DOB/AGE: [birth year] 64 y.o.  Admit date: 11/14/2018 Discharge date: 11/15/2018  Admission Diagnoses:  Discharge Diagnoses:  Active Problems:   Prostate cancer [facility])   Discharged Condition: good  Hospital Course: 63 year old male status post robotic assisted laparoscopic prostatectomy yesterday.  He tolerated the procedure well and was stable postoperatively.  The following day, he was doing well and pain was well controlled.  He was ambulating.  Tolerating clear liquid diet.  Drain with minimal output.  Hemoglobin was rechecked to ensure was stable.  He was discharged home in stable condition  Consults: None  Significant Diagnostic Studies: labs: None  Treatments: surgery: As above  Discharge Exam: Blood pressure (!) 148/93, pulse 61, temperature 97.7 F (36.5 C), temperature source Oral, resp. rate (!) 24, height 6' (1.829 m), weight 75.8 kg, SpO2 100 %. General appearance: alert and cooperative  General: Patient is in no apparent distress Lungs: Normal respiratory effort, chest expands symmetrically. GI: Incisions are c/d/i. The abdomen is soft and nontender without mass. JP drain with scant serosanguinous drainage Foley: Clear yellow urine  Ext: lower extremities symmetric  Disposition:    Allergies as of 11/15/2018   No Known Allergies     Medication List    TAKE these medications   Clear Eyes for Dry Eyes 1-0.25 % Soln Generic drug:  Carboxymethylcellul-Glycerin Place 1 drop into both eyes 2 (two) times daily as needed (dry eyes).   Colon Cleanse Caps Take 1 capsule by mouth daily.   furosemide 40 MG tablet Commonly known as:  LASIX TAKE 1 TABLET(40 MG) BY MOUTH DAILY What changed:  See the new instructions.   hydrALAZINE 50 MG tablet Commonly known as:  APRESOLINE Take 50 mg by mouth 3 (three) times daily.   isosorbide dinitrate 10 MG tablet Commonly known as:  ISORDIL Take 1  tablet (10 mg total) by mouth 3 (three) times daily.   sulfamethoxazole-trimethoprim 800-160 MG tablet Commonly known as:  BACTRIM DS Take 1 tablet by mouth 2 (two) times daily. Start the day prior to foley removal appointment   traMADol 50 MG tablet Commonly known as:  Ultram Take 1-2 tablets (50-100 mg total) by mouth every 6 (six) hours as needed for moderate pain or severe pain.      Follow-up Information    Karen Kays, NP On 11/21/2018.   Specialty:  Nurse Practitioner Why:  at 10:30 Contact information: [place] [address] [place] [address] [phone]           Signed: Marton Redwood, III 11/15/2018, 11:15 AM

## 2018-11-15 NOTE — Progress Notes (Signed)
Pt blood pressure in the 170's/ 90's on shift, MD notified. No new orders placed at this time and pt is no acute distress. Will continue to monitor

## 2018-11-15 NOTE — Progress Notes (Signed)
AVS given to patient and explained at the bedside. Medications and follow up appointments have been explained with pt verbalizing understanding. Pt also sent home with supplies to manage JP drain site and laparoscopic incisions. Foley care and wound care taught at patient bedside with pt verbalizing understanding and performing teach back.

## 2018-11-15 NOTE — Progress Notes (Signed)
Urology Inpatient Progress Report  PROSTATE CANCER  Procedure(s): XI ROBOTIC ASSISTED LAPAROSCOPIC RADICAL PROSTATECTOMY PELVIC LYMPH NODE DISSECTION  1 Day Post-Op   Intv/Subj: No acute events overnight. Patient is without complaint. Hemoglobin relatively stable.  It is 10 from 107.  JP with ADL.  Even less out this morning.  Pain is well controlled.  Ambulating well.  Active Problems:   Prostate cancer ([place])  Current Facility-Administered Medications  Medication Dose Route Frequency Provider Last Rate Last Dose  . dextrose 5 %-0.45 % sodium chloride infusion   Intravenous Continuous Debbrah Alar, PA-C 100 mL/hr at 11/15/18 1110    . diphenhydrAMINE (BENADRYL) injection 12.5-25 mg  12.5-25 mg Intravenous Q6H PRN Debbrah Alar, PA-C       Or  . diphenhydrAMINE (BENADRYL) 12.5 MG/5ML elixir 12.5-25 mg  12.5-25 mg Oral Q6H PRN Dancy, Amanda, PA-C      . docusate sodium (COLACE) capsule 100 mg  100 mg Oral BID Debbrah Alar, PA-C   100 mg at 11/15/18 0953  . hydrALAZINE (APRESOLINE) tablet 50 mg  50 mg Oral TID Debbrah Alar, PA-C   50 mg at 11/15/18 0953  . HYDROmorphone (DILAUDID) injection 0.5-1 mg  0.5-1 mg Intravenous Q2H PRN Debbrah Alar, PA-C   1 mg at 11/14/18 1404  . isosorbide dinitrate (ISORDIL) tablet 10 mg  10 mg Oral TID Debbrah Alar, PA-C   10 mg at 11/15/18 0953  . lactated ringers infusion   Intravenous Continuous Janeece Riggers, MD 50 mL/hr at 11/14/18 0542    . neomycin-bacitracin-polymyxin (NEOSPORIN) ointment packet 1 application  1 application Topical TID PRN Dancy, Amanda, PA-C      . ondansetron (ZOFRAN) injection 4 mg  4 mg Intravenous Q4H PRN Dancy, Amanda, PA-C      . opium-belladonna (B&O SUPPRETTES) 16.2-60 MG suppository 1 suppository  1 suppository Rectal Q6H PRN Debbrah Alar, PA-C      . traMADol (ULTRAM) tablet 50-100 mg  50-100 mg Oral Q6H PRN Debbrah Alar, PA-C   50 mg at 11/14/18 2103     Objective: Vital: Vitals:   11/15/18 0206 11/15/18  0542 11/15/18 0808 11/15/18 1040  BP: 131/76 (!) 158/91  (!) 148/93  Pulse: (!) 43 60  61  Resp: 18 18  (!) 24  Temp: 97.9 F (36.6 C) 97.8 F (36.6 C)  97.7 F (36.5 C)  TempSrc: Oral Oral  Oral  SpO2: 98% 99% 99% 100%  Weight:      Height:       I/Os: I/O last 3 completed shifts: In: 2765 [P.O.:240; I.V.:1525; IV Piggyback:1000] Out: 2050 [Urine:1850; Drains:100; Blood:100]  Physical Exam:  General: Patient is in no apparent distress Lungs: Normal respiratory effort, chest expands symmetrically. GI: Incisions are c/d/i. The abdomen is soft and nontender without mass. JP drain with scant serosanguinous drainage Foley: Clear yellow urine  Ext: lower extremities symmetric  Lab Results: Recent Labs    11/14/18 1216 11/15/18 0525  HGB 11.0* 10.0*  HCT 33.9* 31.7*   Recent Labs    11/15/18 0525  NA 136  K 3.6  CL 100  CO2 27  GLUCOSE 119*  BUN 21  CREATININE 1.96*  CALCIUM 8.7*   No results for input(s): LABPT, INR in the last 72 hours. No results for input(s): LABURIN in the last 72 hours. Results for orders placed or performed during the hospital encounter of 11/11/18  Novel Coronavirus, NAA (hospital order; send-out to ref lab)     Status: None   Collection Time: 11/11/18  2:34 PM  Result Value Ref Range Status   SARS-CoV-2, NAA NOT DETECTED NOT DETECTED Final    Comment: (NOTE) This test was developed and its performance characteristics determined by [employer]. This test has not been FDA cleared or approved. This test has been authorized by FDA under an Emergency Use Authorization (EUA). This test is only authorized for the duration of time the declaration that circumstances exist justifying the authorization of the emergency use of in vitro diagnostic tests for detection of SARS-CoV-2 virus and/or diagnosis of COVID-19 infection under section 564(b)(1) of the Act, 21 U.S.C. 712RFX-5(O)(8), unless the authorization is terminated or  revoked sooner. When diagnostic testing is negative, the possibility of a false negative result should be considered in the context of a patient's recent exposures and the presence of clinical signs and symptoms consistent with COVID-19. An individual without symptoms of COVID-19 and who is not shedding SARS-CoV-2 virus would expect to have a negative (not detected) result in this assay. Performed  At: [facility] [address] [place], [place] [id number] Rush Farmer MD BR:[id number]    [place]  Final    Comment: Performed at [facility], [place] [address], [place], [place] [address]    Studies/Results: No results found.  Assessment: Prostate cancer Procedure(s): XI ROBOTIC ASSISTED LAPAROSCOPIC RADICAL PROSTATECTOMY PELVIC LYMPH NODE DISSECTION, 1 Day Post-Op  doing well.  Plan: Repeat hemoglobin.  If stable, plan to discharge. DC pelvic drain, fluids   Link Snuffer, MD Urology 11/15/2018, 11:12 AM

## 2019-01-13 ENCOUNTER — Other Ambulatory Visit: Payer: Self-pay

## 2019-01-13 ENCOUNTER — Ambulatory Visit (INDEPENDENT_AMBULATORY_CARE_PROVIDER_SITE_OTHER): Payer: Managed Care, Other (non HMO) | Admitting: Cardiovascular Disease

## 2019-01-13 ENCOUNTER — Encounter: Payer: Self-pay | Admitting: Cardiovascular Disease

## 2019-01-13 ENCOUNTER — Telehealth: Payer: Self-pay | Admitting: Cardiovascular Disease

## 2019-01-13 VITALS — BP 136/92 | HR 43 | Ht 72.0 in | Wt 160.6 lb

## 2019-01-13 DIAGNOSIS — I5022 Chronic systolic (congestive) heart failure: Secondary | ICD-10-CM | POA: Diagnosis not present

## 2019-01-13 DIAGNOSIS — I10 Essential (primary) hypertension: Secondary | ICD-10-CM | POA: Diagnosis not present

## 2019-01-13 NOTE — Patient Instructions (Signed)

## 2019-01-13 NOTE — Telephone Encounter (Signed)
New Message ° ° ° °Left message to confirm appt and answer covid questions  °

## 2019-01-13 NOTE — Progress Notes (Signed)
Cardiology Office Note   Date:  01/13/2019   ID:  Terry Nephew Sr., DOB June 12, 1956, MRN [id number]  PCP:  Shirline Frees, MD  Cardiologist:   [name], MD   Chief Complaint  Patient presents with  . Congestive Heart Failure   Problem List: 1. Hypertension 2. Nonsustained ventricular tachycardia 3. Chronic systolic congestive heart failure-ejection fraction of 40% by stress Myoview study in 2009  EF 25-30% by echo 2016.    EF has normalized by echo in July  2019    The patient is a middle-aged gentleman with a history of hypertension,  palpitations, and nonsustained ventricular tachycardia.   The patient has been followed for many years for mildly dilated left  ventricle. He has also had some nonsustained ventricular tachycardia.  He has had numerous stress test. All of which have been negative.  Oct. 27, 2015:  Terry Moss is seen today after ~6 year absence.  He was followed for years for mild systolic CHF, and NSVT.  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 No smptoms. Had several stress tests for years from 2000 to 2009 - all of which were negative.  He exercises regularly without any difficutly.  He denies any syncope or pre-syncope.  Jan. 4, 2016: Terry Moss is seen back today for follow up of his CHF. He has had HTN for years. He was seen in Oct. After a multiple year absence.  He is on the full dose of hydralazine and imdur He eats out quite a bit.  He has significant DOE after taking the hydralazine and Imdur. Struggles while playing basketball or running  Jan. 25, 2016:  BP is much better. May be slightly too low at times. Has symptoms of orthostasis on occasion.     September 20, 2014:   Terry Moss. is a 63 y.o. male who presents for follow up of his CHF. We had arranged for him to have a cardiac cath but it was cancelled due to a bump in his creatinine.  He has known CKD.  Breathing is ok Still runs on a  regular basis.  Nov 08, 2014: Terry Moss continues to do very well. He still exercises on a regular basis. We originally had scheduled him for cardiac cath but canceled as of a  creatinine is still around 2.0. He denies any chest pain. His breathing is okay.   Sept. 13, 2016:   Terry Moss is doing ok Has chronic systolic CHF.  Did not have his cath due to CKD Still very active, plays basketball regularly without any symptoms .   Dec. 6, 2016:  Terry Moss has some chest pain  - center of his chest , radiated to the right side.  Felt more muscular after several hours.  Now goes into his back  Takes his BP at home.  Typically 110s- 125 range / 70-80s.  Jan. 9, 2018:  Doing well.  Still working out. Has not had the cath yet.   Echocardiogram in May, 2016 revealed an improved left ventricle systolic function with an EF of 45-50%. His previous EF had been as low as 20%. Still eating some salty foods.  Still eats out 3-4 times a week.    Oct. 1, 2018:  Terry Moss is doing well.   No CP or dyspnea..  Plays basketball twice a week .  Still eating out 3-4 times a week   October 14, 2017:  Terry Moss is seen today ,  No dyspnea or CP . Has been having some issues with gout.  BP is typically well controlled.  Is elevated today .   He has been stressed.    May 01, 2018: Terry Moss is seen today  Has had some dizziness after taking the hydralazine and Imdur - which he takes TID  His LV function has improved on this regimin Scheduled for right elbow surgery soon.   January 13, 2019: Terry Moss is seen today for follow-up visit.  The blood pressure is well controlled.  His heart rate is chronically slow.  Had prostate surgery in May  ( robotic surgery)    Past Medical History:  Diagnosis Date  . Abnormal kidney function    sees [place] kidney x 2   . Anemia   . Cancer [facility]) dx dec 2019   prostate  . Cardiomegaly   . Chronic kidney disease   . Dysrhythmia   . Hypertension   . Pneumonia 1993  .  Sinus complaint     Past Surgical History:  Procedure Laterality Date  . ELBOW SURGERY Right 05/2018  . PELVIC LYMPH NODE DISSECTION Bilateral 11/14/2018   Procedure: PELVIC LYMPH NODE DISSECTION;  Surgeon: Ardis Hughs, MD;  Location: [facility];  Service: Urology;  Laterality: Bilateral;  . pleurisy  1993   Right lung surgery   . PROSTATE BIOPSY    . ROBOT ASSISTED LAPAROSCOPIC RADICAL PROSTATECTOMY N/A 11/14/2018   Procedure: XI ROBOTIC ASSISTED LAPAROSCOPIC RADICAL PROSTATECTOMY;  Surgeon: Ardis Hughs, MD;  Location: [facility];  Service: Urology;  Laterality: N/A;     Current Outpatient Medications  Medication Sig Dispense Refill  . Carboxymethylcellul-Glycerin (CLEAR EYES FOR DRY EYES) 1-0.25 % SOLN Place 1 drop into both eyes 2 (two) times daily as needed (dry eyes).    . furosemide (LASIX) 40 MG tablet Take 40 mg by mouth 3 (three) times a week.    . hydrALAZINE (APRESOLINE) 50 MG tablet Take 50 mg by mouth 3 (three) times daily.    . isosorbide dinitrate (ISORDIL) 10 MG tablet Take 1 tablet (10 mg total) by mouth 3 (three) times daily. 270 tablet 3  . Misc Natural Products (COLON CLEANSE) CAPS Take 1 capsule by mouth daily.     No current facility-administered medications for this visit.     Allergies:   Patient has no known allergies.    Social History:  The patient  reports that he has never smoked. He has never used smokeless tobacco. He reports current alcohol use. He reports that he does not use drugs.   Family History:  The patient's family history includes Heart attack in his brother; Heart disease in his brother, father, and sister; Hypertension in his mother; Stroke in his brother, father, and mother.    ROS:   Noted in current history, all other systems are negative.   Physical Exam: Blood pressure (!) 136/92, pulse (!) 43, height 6' (1.829 m), weight 160 lb 9.6 oz (72.8 kg), SpO2 98 %.  GEN:  Well nourished, well developed in no acute distress HEENT:  Normal NECK: No JVD; No carotid bruits LYMPHATICS: No lymphadenopathy CARDIAC: RRR, 2/6 systolic murmur  RESPIRATORY:  Clear to auscultation without rales, wheezing or rhonchi  ABDOMEN: Soft, non-tender, non-distended MUSCULOSKELETAL:  No edema; No deformity  SKIN: Warm and dry NEUROLOGIC:  Alert and oriented x 3   EKG:    .    Recent Labs: 11/11/2018: ALT 15; Platelets 167 11/15/2018: BUN 21; Creatinine, Ser 1.96; Hemoglobin 10.0;  Potassium 3.6; Sodium 136    Lipid Panel    Component Value Date/Time   CHOL 176 04/01/2017 1549   TRIG 94 04/01/2017 1549   HDL 61 04/01/2017 1549   CHOLHDL 2.9 04/01/2017 1549   LDLCALC 96 04/01/2017 1549      Wt Readings from Last 3 Encounters:  01/13/19 160 lb 9.6 oz (72.8 kg)  11/14/18 167 lb (75.8 kg)  11/11/18 166 lb (75.3 kg)      Other studies Reviewed: Additional studies/ records that were reviewed today include: . Review of the above records demonstrates:    ASSESSMENT AND PLAN:  1. Hypertension -    . BP is well controlled.   2.  PVCs/ Nonsustained ventricular tachycardia-   3. Chronic combined systolic and diastolic congestive heart failure-   EF has improved. ,  Continue meds   4.  ED:  He asked about cialis. He is on long term Imdur currently.   He would have to stop the Imdur if we consider cialis or similar meds Have asked him to discuss with his urologist to see if there are other meds that would not interfer with his imdur    Current medicines are reviewed at length with the patient today.  The patient does not have concerns regarding medicines.  The following changes have been made:  See above.   Labs/ tests ordered today include: BMP    No orders of the defined types were placed in this encounter.   Disposition:   FU with me in 6 months   Signed, [name], MD  01/13/2019 3:27 PM    [facility] [address], [place], [place]  [address] Phone: [phone]; Fax: [phone]

## 2019-01-13 NOTE — Telephone Encounter (Signed)

## 2019-03-31 ENCOUNTER — Other Ambulatory Visit: Payer: Self-pay | Admitting: Family Medicine

## 2019-03-31 ENCOUNTER — Ambulatory Visit
Admission: RE | Admit: 2019-03-31 | Discharge: 2019-03-31 | Disposition: A | Discharge status: Home / Self Care | Payer: Managed Care, Other (non HMO) | Source: Ambulatory Visit | Attending: Family Medicine | Admitting: Family Medicine

## 2019-03-31 DIAGNOSIS — S5002XS Contusion of left elbow, sequela: Secondary | ICD-10-CM

## 2019-05-09 ENCOUNTER — Other Ambulatory Visit: Payer: Self-pay | Admitting: Cardiovascular Disease

## 2019-08-28 ENCOUNTER — Telehealth: Payer: Self-pay | Admitting: Cardiovascular Disease

## 2019-08-28 NOTE — Telephone Encounter (Signed)
Pt c/o medication issue:  1. Name of Medication: furosemide (LASIX) 40 MG tablet  2. How are you currently taking this medication (dosage and times per day)? No longer taking  3. Are you having a reaction (difficulty breathing--STAT)? no  4. What is your medication issue?  Patient states he was told he could not take any sexual performance medications while on lasix. He says he is no longer taking the lasix and would like to know if he could start viagra or cialis. Please advise.

## 2019-08-30 NOTE — Telephone Encounter (Signed)
Please call Cata and inform him that the Lasix is not the issue The Isosorbide ( Isordil) is the issue with viagra and similar meds. I have already given him permission to stop the isordil which will allow him to take viagra /cialis or similar I would like to see him back in 3 months after he holds the isorbide to make sure he is doing well He should continue the lasix

## 2019-08-31 NOTE — Telephone Encounter (Signed)
Spoke with patient and reviewed Dr. Elmarie Shiley advice. Patient states his kidney doctor stopped his furosemide and advised him to take it only if he has swelling or weight gain. She increased his hydralazine to 75 mg TID. He agrees to stop isosorbide and will monitor signs and symptoms of HF including SOB, weight gain, swelling, PND,  and orthopnea. I scheduled him for an appointment with Dr. Acie Fredrickson on April 9 and advised him to call back sooner with questions or concerns. I advised him to decrease the isosordil over the next few days. He verbalized understanding and agreement with plan and thanked me for the call.

## 2019-08-31 NOTE — Telephone Encounter (Signed)
Patient is calling back requesting to speak with Christen Bame again in regards to there previous conversation. He states after thinking about it, it may be better for him to go ahead and get checked tomorrow. He states he wanted to run it by [place] before scheduling. Please advise.

## 2019-09-03 ENCOUNTER — Telehealth: Payer: Self-pay | Admitting: Cardiovascular Disease

## 2019-09-03 NOTE — Telephone Encounter (Signed)
  Pt c/o medication issue:  1. Name of Medication: hydrALAZINE (APRESOLINE) 50 MG tablet Isosorbide 10mg   2. How are you currently taking this medication (dosage and times per day)? Both 3 times a day  3. Are you having a reaction (difficulty breathing--STAT)? no  4. What is your medication issue? Patient states he was told to stop taking his isosorbide, but he read that hydralazine by itself is bad on kidneys. He would like to know if he should be checked before he stops taking the medication and after to compare. Please advise.

## 2019-09-03 NOTE — Telephone Encounter (Signed)
Called patient who called to ask if hydralazine is bad on his kidneys if taken alone without isosorbide. I advised that per Dr. Acie Fredrickson, the hydralazine is metabolized primarily by the liver. Patient states he read some information on the internet and he also thought his PCP advised that hydralazine is bad on the kidneys but not when it is taken with isosorbide. I advised that the information we have on these medications states that both are metabolized by the liver, not the kidneys. Patient states his kidney doctor was more concerned with the furosemide effect on kidneys and she reduced it to PRN. I advised that Dr. Acie Fredrickson agrees with that information. 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 He verbalized understanding of oru advice and thanked me for the call.

## 2019-10-09 ENCOUNTER — Other Ambulatory Visit: Payer: Self-pay

## 2019-10-09 ENCOUNTER — Encounter: Payer: Self-pay | Admitting: Cardiovascular Disease

## 2019-10-09 ENCOUNTER — Ambulatory Visit (INDEPENDENT_AMBULATORY_CARE_PROVIDER_SITE_OTHER): Payer: Managed Care, Other (non HMO) | Admitting: Cardiovascular Disease

## 2019-10-09 VITALS — BP 144/64 | HR 58 | Ht 72.0 in | Wt 168.5 lb

## 2019-10-09 DIAGNOSIS — I428 Other cardiomyopathies: Secondary | ICD-10-CM | POA: Diagnosis not present

## 2019-10-09 DIAGNOSIS — I5022 Chronic systolic (congestive) heart failure: Secondary | ICD-10-CM | POA: Diagnosis not present

## 2019-10-09 DIAGNOSIS — I10 Essential (primary) hypertension: Secondary | ICD-10-CM

## 2019-10-09 DIAGNOSIS — I4892 Unspecified atrial flutter: Secondary | ICD-10-CM

## 2019-10-09 MED ORDER — TADALAFIL 20 MG PO TABS: 20.0000 mg | ORAL_TABLET | Freq: Every day | ORAL | 6 refills | Status: DC | PRN

## 2019-10-09 NOTE — Patient Instructions (Signed)
Medication Instructions:  Your physician has recommended you make the following change in your medication:  You may take Cialis 20 mg daily as needed  *If you need a refill on your cardiac medications before your next appointment, please call your pharmacy*   Lab Work: None Ordered If you have labs (blood work) drawn today and your tests are completely normal, you will receive your results only by: Marland Kitchen MyChart Message (if you have MyChart) OR . A paper copy in the mail If you have any lab test that is abnormal or we need to change your treatment, we will call you to review the results.   Testing/Procedures: None Ordered   Follow-Up: At [facility], you and your health needs are our priority.  As part of our continuing mission to provide you with exceptional heart care, we have created designated Provider Care Teams.  These Care Teams include your primary Cardiologist (physician) and Advanced Practice Providers (APPs -  Physician Assistants and Nurse Practitioners) who all work together to provide you with the care you need, when you need it.  We recommend signing up for the patient portal called "MyChart".  Sign up information is provided on this After Visit Summary.  MyChart is used to connect with patients for Virtual Visits (Telemedicine).  Patients are able to view lab/test results, encounter notes, upcoming appointments, etc.  Non-urgent messages can be sent to your provider as well.   To learn more about what you can do with MyChart, go to [email or web address].    Your next appointment:   6 month(s)  The format for your next appointment:   In Person  Provider:   You may see [name], MD or one of the following Advanced Practice Providers on your designated Care Team:    Richardson Dopp, PA-C  [place], [place]  Daune Perch, [place]

## 2019-10-09 NOTE — Progress Notes (Signed)
Cardiology Office Note   Date:  10/09/2019   ID:  Terry Nephew Sr., DOB 03-18-1956, MRN [id number]  PCP:  Shirline Frees, MD  Cardiologist:   [name], MD   Chief Complaint  Patient presents with  . Congestive Heart Failure  . Hypertension   Problem List: 1. Hypertension 2. Nonsustained ventricular tachycardia 3. Chronic systolic congestive heart failure-ejection fraction of 40% by stress Myoview study in 2009  EF 25-30% by echo 2016.    EF has normalized by echo in July  2019    The patient is a middle-aged gentleman with a history of hypertension,  palpitations, and nonsustained ventricular tachycardia.   The patient has been followed for many years for mildly dilated left  ventricle. He has also had some nonsustained ventricular tachycardia.  He has had numerous stress test. All of which have been negative.  Oct. 27, 2015:  Terry Moss is seen today after ~6 year absence.  He was followed for years for mild systolic CHF, and NSVT.  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 No smptoms. Had several stress tests for years from 2000 to 2009 - all of which were negative.  He exercises regularly without any difficutly.  He denies any syncope or pre-syncope.  Jan. 4, 2016: Terry Moss is seen back today for follow up of his CHF. He has had HTN for years. He was seen in Oct. After a multiple year absence.  He is on the full dose of hydralazine and imdur He eats out quite a bit.  He has significant DOE after taking the hydralazine and Imdur. Struggles while playing basketball or running  Jan. 25, 2016:  BP is much better. May be slightly too low at times. Has symptoms of orthostasis on occasion.     September 20, 2014:   Terry R Kavin Weisenberg. is a 64 y.o. male who presents for follow up of his CHF. We had arranged for him to have a cardiac cath but it was cancelled due to a bump in his creatinine.  He has known CKD.  Breathing is ok  Still runs on a regular basis.  Nov 08, 2014: Terry Moss continues to do very well. He still exercises on a regular basis. We originally had scheduled him for cardiac cath but canceled as of a  creatinine is still around 2.0. He denies any chest pain. His breathing is okay.   Sept. 13, 2016:   Terry Moss is doing ok Has chronic systolic CHF.  Did not have his cath due to CKD Still very active, plays basketball regularly without any symptoms .   Dec. 6, 2016:  Terry Moss has some chest pain  - center of his chest , radiated to the right side.  Felt more muscular after several hours.  Now goes into his back  Takes his BP at home.  Typically 110s- 125 range / 70-80s.  Jan. 9, 2018:  Doing well.  Still working out. Has not had the cath yet.   Echocardiogram in May, 2016 revealed an improved left ventricle systolic function with an EF of 45-50%. His previous EF had been as low as 20%. Still eating some salty foods.  Still eats out 3-4 times a week.    Oct. 1, 2018:  Terry Moss is doing well.   No CP or dyspnea..  Plays basketball twice a week .  Still eating out 3-4 times a week   October 14, 2017:  Terry Moss is  seen today ,   No dyspnea or CP . Has been having some issues with gout.  BP is typically well controlled.  Is elevated today .   He has been stressed.    May 01, 2018: Terry Moss is seen today  Has had some dizziness after taking the hydralazine and Imdur - which he takes TID  His LV function has improved on this regimin Scheduled for right elbow surgery soon.   January 13, 2019: Terry Moss is seen today for follow-up visit.  The blood pressure is well controlled.  His heart rate is chronically slow.  Had prostate surgery in May  ( robotic surgery)    October 09, 2019: Terry Moss  is doing well.  Is not had any worsening chest pain or shortness of breath.  We stopped his isosorbide a month or so ago.  He would like to try cialis    Past Medical History:  Diagnosis Date  . Abnormal  kidney function    sees [place] kidney x 2   . Anemia   . Cancer [facility]) dx dec 2019   prostate  . Cardiomegaly   . Chronic kidney disease   . Dysrhythmia   . Hypertension   . Pneumonia 1993  . Sinus complaint     Past Surgical History:  Procedure Laterality Date  . ELBOW SURGERY Right 05/2018  . PELVIC LYMPH NODE DISSECTION Bilateral 11/14/2018   Procedure: PELVIC LYMPH NODE DISSECTION;  Surgeon: Ardis Hughs, MD;  Location: [facility];  Service: Urology;  Laterality: Bilateral;  . pleurisy  1993   Right lung surgery   . PROSTATE BIOPSY    . ROBOT ASSISTED LAPAROSCOPIC RADICAL PROSTATECTOMY N/A 11/14/2018   Procedure: XI ROBOTIC ASSISTED LAPAROSCOPIC RADICAL PROSTATECTOMY;  Surgeon: Ardis Hughs, MD;  Location: [facility];  Service: Urology;  Laterality: N/A;     Current Outpatient Medications  Medication Sig Dispense Refill  . amLODipine (NORVASC) 5 MG tablet Take 5 mg by mouth at bedtime.    . Carboxymethylcellul-Glycerin (CLEAR EYES FOR DRY EYES) 1-0.25 % SOLN Place 1 drop into both eyes 2 (two) times daily as needed (dry eyes).    . hydrALAZINE (APRESOLINE) 25 MG tablet Take 75 mg by mouth 3 (three) times daily.    . Vitamin D, Cholecalciferol, 25 MCG (1000 UT) TABS Take 1 tablet by mouth daily.     No current facility-administered medications for this visit.    Allergies:   Patient has no known allergies.    Social History:  The patient  reports that he has never smoked. He has never used smokeless tobacco. He reports current alcohol use. He reports that he does not use drugs.   Family History:  The patient's family history includes Heart attack in his brother; Heart disease in his brother, father, and sister; Hypertension in his mother; Stroke in his brother, father, and mother.    ROS:   Noted in current history, all other systems are negative.   Physical Exam: Blood pressure (!) 144/64, pulse (!) 58, height 6' (1.829 m), weight 168 lb 8 oz (76.4 kg), SpO2  98 %.  GEN:  Well nourished, well developed in no acute distress HEENT: Normal NECK: No JVD; No carotid bruits LYMPHATICS: No lymphadenopathy CARDIAC: RRR ,  Frequent PVCs  RESPIRATORY:  Clear to auscultation without rales, wheezing or rhonchi  ABDOMEN: Soft, non-tender, non-distended MUSCULOSKELETAL:  No edema; No deformity  SKIN: Warm and dry NEUROLOGIC:  Alert and oriented x 3   EKG:  October 09, 2019: Normal sinus rhythm with frequent premature ventricular contractions.  No ST or T wave changes.  .    Recent Labs: 11/11/2018: ALT 15; Platelets 167 11/15/2018: BUN 21; Creatinine, Ser 1.96; Hemoglobin 10.0; Potassium 3.6; Sodium 136    Lipid Panel    Component Value Date/Time   CHOL 176 04/01/2017 1549   TRIG 94 04/01/2017 1549   HDL 61 04/01/2017 1549   CHOLHDL 2.9 04/01/2017 1549   LDLCALC 96 04/01/2017 1549      Wt Readings from Last 3 Encounters:  10/09/19 168 lb 8 oz (76.4 kg)  01/13/19 160 lb 9.6 oz (72.8 kg)  11/14/18 167 lb (75.8 kg)      Other studies Reviewed: Additional studies/ records that were reviewed today include: . Review of the above records demonstrates:    ASSESSMENT AND PLAN:  1. Hypertension -     overall he seems to be doing well.  His blood pressure is mildly elevated but he admits to eating salty foods every day.  Will ask him to avoid salty foods.   2.  PVCs/ Nonsustained ventricular tachycardia-   3. Chronic combined systolic and diastolic congestive heart failure-  He is done well off the isosorbide.  He is not noticing any worsening shortness of breath or heart failure symptoms.  At this point I think it is okay for him to try Cialis.    Current medicines are reviewed at length with the patient today.  The patient does not have concerns regarding medicines.  The following changes have been made:  See above.   Labs/ tests ordered today include: BMP    No orders of the defined types were placed in this encounter.    Disposition:   FU with me in 6 months   Signed, [name], MD  10/09/2019 4:26 PM    [facility] [place], [place], [place]  [address] Phone: [phone]; Fax: [phone]

## 2020-11-10 ENCOUNTER — Ambulatory Visit
Admission: RE | Admit: 2020-11-10 | Discharge: 2020-11-10 | Disposition: A | Discharge status: Home / Self Care | Payer: Managed Care, Other (non HMO) | Source: Ambulatory Visit | Attending: Family Medicine | Admitting: Family Medicine

## 2020-11-10 ENCOUNTER — Other Ambulatory Visit: Payer: Self-pay | Admitting: Family Medicine

## 2020-11-10 ENCOUNTER — Other Ambulatory Visit: Payer: Self-pay

## 2020-11-10 DIAGNOSIS — R0602 Shortness of breath: Secondary | ICD-10-CM

## 2020-11-15 ENCOUNTER — Telehealth: Payer: Self-pay | Admitting: Cardiovascular Disease

## 2020-11-15 NOTE — Telephone Encounter (Signed)
Pt recently had and EKG and Blood work done last week at his PCP office, pt would like to know what the results are. Please advise

## 2020-11-15 NOTE — Telephone Encounter (Signed)
Left message to call back  

## 2020-12-19 ENCOUNTER — Ambulatory Visit: Payer: Managed Care, Other (non HMO) | Admitting: Family

## 2020-12-19 NOTE — Progress Notes (Deleted)
Office Visit    Patient Name: Terry SHIRAH Sr. Date of Encounter: 12/19/2020  PCP:  Shirline Frees, [facility]  Cardiologist:  [name], MD  Advanced Practice Provider:  No care team member to display Electrophysiologist:  Will Meredith Leeds, MD    Chief Complaint    Terry Nephew Sr. is a 65 y.o. male with a hx of hypertension, palpitations, nonsustained ventricular tachycardia, chronic systolic heart failure presents today for shortness of breath  Past Medical History    Past Medical History:  Diagnosis Date   Abnormal kidney function    sees [place] kidney x 2    Anemia    Cancer ([place]) dx dec 2019   prostate   Cardiomegaly    Chronic kidney disease    Dysrhythmia    Hypertension    Pneumonia 1993   Sinus complaint    Past Surgical History:  Procedure Laterality Date   ELBOW SURGERY Right 05/2018   PELVIC LYMPH NODE DISSECTION Bilateral 11/14/2018   Procedure: PELVIC LYMPH NODE DISSECTION;  Surgeon: Ardis Hughs, MD;  Location: [facility];  Service: Urology;  Laterality: Bilateral;   pleurisy  1993   Right lung surgery    PROSTATE BIOPSY     ROBOT ASSISTED LAPAROSCOPIC RADICAL PROSTATECTOMY N/A 11/14/2018   Procedure: XI ROBOTIC ASSISTED LAPAROSCOPIC RADICAL PROSTATECTOMY;  Surgeon: Ardis Hughs, MD;  Location: [facility];  Service: Urology;  Laterality: N/A;    Allergies  No Known Allergies  History of Present Illness    Terry R Nicholad Kautzman. is a 65 y.o. male with a hx of hypertension, palpitations, nonsustained ventricular tachycardia, chronic systolic heart failure last seen 10/09/2019 by Dr. Acie Fredrickson.  ***  He was seen by his primary care provider for suspicion/22 noting worsening shortness of breath with concern for worsening heart failure symptoms.  He was encouraged to follow-up with our office.  He presents today for follow-up.***   EKGs/Labs/Other Studies Reviewed:   The following studies were reviewed  today:  Echo 12/2017 Left ventricle: The cavity size was normal. Wall thickness was    increased in a pattern of moderate LVH. Systolic function was    normal. The estimated ejection fraction was in the range of 50%    to 55%. Left ventricular diastolic function parameters were    normal for the patient&'s age.  - Left atrium: The atrium was moderately dilated.  - Right atrium: The atrium was mildly dilated.   EKG:  EKG is ordered today.  The ekg ordered today demonstrates ***  Recent Labs: No results found for requested labs within last 8760 hours.  Recent Lipid Panel    Component Value Date/Time   CHOL 176 04/01/2017 1549   TRIG 94 04/01/2017 1549   HDL 61 04/01/2017 1549   CHOLHDL 2.9 04/01/2017 1549   [place] 96 04/01/2017 1549    Home Medications   No outpatient medications have been marked as taking for the 12/19/20 encounter (Appointment) with Loel Dubonnet, NP.     Review of Systems    All other systems reviewed and are otherwise negative except as noted above.  Physical Exam    VS:  There were no vitals taken for this visit. , BMI There is no height or weight on file to calculate BMI.  Wt Readings from Last 3 Encounters:  10/09/19 168 lb 8 oz (76.4 kg)  01/13/19 160 lb 9.6 oz (72.8 kg)  11/14/18 167 lb (75.8  kg)     GEN: Well nourished, well developed, in no acute distress. HEENT: normal. Neck: Supple, no JVD, carotid bruits, or masses. Cardiac: ***RRR, no murmurs, rubs, or gallops. No clubbing, cyanosis, edema.  ***Radials/DP/PT 2+ and equal bilaterally.  Respiratory:  ***Respirations regular and unlabored, clear to auscultation bilaterally. GI: Soft, nontender, nondistended. MS: No deformity or atrophy. Skin: Warm and dry, no rash. Neuro:  Strength and sensation are intact. Psych: Normal affect.  Assessment & Plan    Chronic systolic heart failure-  Nonsustained VT / PVC-  Hypertension- HLD - 12/2019 total choletserol 226, triglyceride 87, LDL  152, HDL 59. ***  Disposition: Follow up {follow up:15908} with Dr. Acie Fredrickson or APP  Signed, Loel Dubonnet, NP 12/19/2020, 1:43 PM [place]

## 2021-03-07 ENCOUNTER — Ambulatory Visit: Payer: Managed Care, Other (non HMO) | Admitting: Cardiology

## 2021-03-10 ENCOUNTER — Ambulatory Visit (INDEPENDENT_AMBULATORY_CARE_PROVIDER_SITE_OTHER): Payer: 59 | Admitting: Cardiovascular Disease

## 2021-03-10 ENCOUNTER — Encounter: Payer: Self-pay | Admitting: Cardiovascular Disease

## 2021-03-10 ENCOUNTER — Other Ambulatory Visit: Payer: Self-pay

## 2021-03-10 VITALS — BP 146/70 | HR 60 | Ht 72.0 in | Wt 174.6 lb

## 2021-03-10 DIAGNOSIS — I493 Ventricular premature depolarization: Secondary | ICD-10-CM | POA: Diagnosis not present

## 2021-03-10 DIAGNOSIS — I5022 Chronic systolic (congestive) heart failure: Secondary | ICD-10-CM | POA: Diagnosis not present

## 2021-03-10 NOTE — Progress Notes (Signed)
Cardiology Office Note   Date:  03/10/2021   ID:  Terry Nephew Sr., DOB 1955/09/13, MRN [id number]  PCP:  Shirline Frees, MD  Cardiologist:   [name], MD   Chief Complaint  Patient presents with   Congestive Heart Failure   Problem List: 1. Hypertension 2. Nonsustained ventricular tachycardia 3. Chronic systolic congestive heart failure-ejection fraction of 40% by stress Myoview study in 2009  EF 25-30% by echo 2016.    EF has normalized by echo in July  2019    The patient is a middle-aged gentleman with a history of hypertension,   palpitations, and nonsustained ventricular tachycardia.   The patient has been followed for many years for mildly dilated left   ventricle. He has also had some nonsustained ventricular tachycardia.   He has had numerous stress test. All of which have been negative.  Oct. 27, 2015:  Terry Moss is seen today after ~6 year absence.   He was followed for years for mild systolic CHF, and NSVT.   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 No smptoms. Had several stress tests for years from 2000 to 2009 - all of which were negative.   He exercises regularly without any difficutly.  He denies any syncope or pre-syncope.  Jan. 4, 2016: Terry Moss is seen back today for follow up of his CHF. He has had HTN for years.   He was seen in Oct. After a multiple year absence.   He is on the full dose of hydralazine and imdur He eats out quite a bit.  He has significant DOE after taking the hydralazine and Imdur.   Struggles while playing basketball or running  Jan. 25, 2016:  BP is much better. May be slightly too low at times.  Has symptoms of orthostasis on occasion.     September 20, 2014:   Terry Moss. is a 65 y.o. male who presents for follow up of his CHF. We had arranged for him to have a cardiac cath but it was cancelled due to a bump in his creatinine.  He has known CKD.  Breathing is ok Still runs on a  regular basis.  Nov 08, 2014: [name] continues to do very well. He still exercises on a regular basis. We originally had scheduled him for cardiac cath but canceled as of a  creatinine is still around 2.0. He denies any chest pain. His breathing is okay.   Sept. 13, 2016:   Terry Moss is doing ok Has chronic systolic CHF.  Did not have his cath due to CKD Still very active, plays basketball regularly without any symptoms .   Dec. 6, 2016:  Terry Moss has some chest pain  - center of his chest , radiated to the right side.  Felt more muscular after several hours.  Now goes into his back  Takes his BP at home.  Typically 110s- 125 range / 70-80s.  Jan. 9, 2018:  Doing well.  Still working out. Has not had the cath yet.   Echocardiogram in May, 2016 revealed an improved left ventricle systolic function with an EF of 45-50%. His previous EF had been as low as 20%. Still eating some salty foods.  Still eats out 3-4 times a week.    Oct. 1, 2018:  [name] is doing well.   No CP or dyspnea..  Plays basketball twice a week .  Still eating out 3-4  times a week   October 14, 2017:  Terry Moss is seen today ,   No dyspnea or CP . Has been having some issues with gout.  BP is typically well controlled.  Is elevated today .   He has been stressed.    May 01, 2018: Terry Moss is seen today  Has had some dizziness after taking the hydralazine and Imdur - which he takes TID  His LV function has improved on this regimin Scheduled for right elbow surgery soon.   January 13, 2019: Terry Moss is seen today for follow-up visit.  The blood pressure is well controlled.  His heart rate is chronically slow.  Had prostate surgery in May  ( robotic surgery)    October 09, 2019: Terry Moss  is doing well.  Is not had any worsening chest pain or shortness of breath.  We stopped his isosorbide a month or so ago.  He would like to try cialis   Sept. 9, 2022: Terry Moss is seen back today for follow up of his CHF.   Was  having some DOE earlier this year. Is back working out now  [employer] last week .   Echo from 2019 shows normal LV EF . Moderate LVH  Is exercising , is doing all that he wants to do without any DOE .  Last creatinine I can find is from June, 2022 - 2.13    Past Medical History:  Diagnosis Date   Abnormal kidney function    sees Terry Moss kidney x 2    Anemia    Cancer ([place]) dx dec 2019   prostate   Cardiomegaly    Chronic kidney disease    Dysrhythmia    Hypertension    Pneumonia 1993   Sinus complaint     Past Surgical History:  Procedure Laterality Date   ELBOW SURGERY Right 05/2018   PELVIC LYMPH NODE DISSECTION Bilateral 11/14/2018   Procedure: PELVIC LYMPH NODE DISSECTION;  Surgeon: Ardis Hughs, MD;  [facility];  Service: Urology;  Laterality: Bilateral;   pleurisy  1993   Right lung surgery    PROSTATE BIOPSY     ROBOT ASSISTED LAPAROSCOPIC RADICAL PROSTATECTOMY N/A 11/14/2018   Procedure: XI ROBOTIC ASSISTED LAPAROSCOPIC RADICAL PROSTATECTOMY;  Surgeon: Ardis Hughs, MD;  [facility];  Service: Urology;  Laterality: N/A;     Current Outpatient Medications  Medication Sig Dispense Refill   amLODipine (NORVASC) 10 MG tablet Take 10 mg by mouth at bedtime.     Carboxymethylcellul-Glycerin 1-0.25 % SOLN Place 1 drop into both eyes 2 (two) times daily as needed (dry eyes).     hydrALAZINE (APRESOLINE) 25 MG tablet Take 75 mg by mouth 3 (three) times daily.     hydrochlorothiazide (MICROZIDE) 12.5 MG capsule Take 12.5 mg by mouth daily.     losartan (COZAAR) 50 MG tablet Take 50 mg by mouth daily.     Vitamin D, Cholecalciferol, 25 MCG (1000 UT) TABS Take 1 tablet by mouth daily.     No current facility-administered medications for this visit.    Allergies:   Patient has no known allergies.    Social History:  The patient  reports that he has never smoked. He has never used smokeless tobacco. He reports current alcohol use. He reports that he  does not use drugs.   Family History:  The patient's family history includes Heart attack in his brother; Heart disease in his brother, father, and sister; Hypertension in his mother; Stroke in  his brother, father, and mother.    ROS:   Noted in current history, all other systems are negative.  Physical Exam: Blood pressure (!) 146/70, pulse 60, height 6' (1.829 m), weight 174 lb 9.6 oz (79.2 kg), SpO2 97 %.  GEN:  Well nourished, well developed in no acute distress HEENT: Normal NECK: No JVD; No carotid bruits LYMPHATICS: No lymphadenopathy CARDIAC: RRR ,  frequent premature beats,   1-2/6 harsh murmur  RESPIRATORY:  Clear to auscultation without rales, wheezing or rhonchi  ABDOMEN: Soft, non-tender, non-distended MUSCULOSKELETAL:  No edema; No deformity  SKIN: Warm and dry NEUROLOGIC:  Alert and oriented x 3   EKG:    Sept. 9, 2022:   NSR with frequent PACs and PVCs   .    Recent Labs: No results found for requested labs within last 8760 hours.    Lipid Panel    Component Value Date/Time   CHOL 176 04/01/2017 1549   TRIG 94 04/01/2017 1549   HDL 61 04/01/2017 1549   CHOLHDL 2.9 04/01/2017 1549   LDLCALC 96 04/01/2017 1549      Wt Readings from Last 3 Encounters:  03/10/21 174 lb 9.6 oz (79.2 kg)  10/09/19 168 lb 8 oz (76.4 kg)  01/13/19 160 lb 9.6 oz (72.8 kg)      Other studies Reviewed: Additional studies/ records that were reviewed today include: . Review of the above records demonstrates:    ASSESSMENT AND PLAN:  1. Hypertension -   BP is overall well controlled.   He is following with nephrology .       2.  PVCs/ Nonsustained ventricular tachycardia-  has seen Dr. Curt Bears.  No plans for any further treatment of his PVCs as long has his LV function is stable    3. Chronic combined systolic and diastolic congestive heart failure-   EF hs remained stable.  Consider repeat echo in the next year or so .  Currently, he is exercising and is not having  any doe     Current medicines are reviewed at length with the patient today.  The patient does not have concerns regarding medicines.  The following changes have been made:  See above.   Labs/ tests ordered today include: BMP    Orders Placed This Encounter  Procedures   EKG 12-Lead     Disposition:   FU with me in 1 year   Signed, [name], MD  03/10/2021 4:58 PM    [facility] [address], [place], [place]  [address] Phone: [phone]; Fax: [phone]

## 2021-03-10 NOTE — Patient Instructions (Signed)
Medication Instructions:  Continue current medications. We have removed Norvasc 5 mg and Cialis from your med list since you are not taking these.  *If you need a refill on your cardiac medications before your next appointment, please call your pharmacy*   Lab Work: none   Testing/Procedures: non   Follow-Up: At [facility], you and your health needs are our priority.  As part of our continuing mission to provide you with exceptional heart care, we have created designated Provider Care Teams.  These Care Teams include your primary Cardiologist (physician) and Advanced Practice Providers (APPs -  Physician Assistants and Nurse Practitioners) who all work together to provide you with the care you need, when you need it.  We recommend signing up for the patient portal called "MyChart".  Sign up information is provided on this After Visit Summary.  MyChart is used to connect with patients for Virtual Visits (Telemedicine).  Patients are able to view lab/test results, encounter notes, upcoming appointments, etc.  Non-urgent messages can be sent to your provider as well.   To learn more about what you can do with MyChart, go to [email or web address].    Your next appointment:   12 month(s)  The format for your next appointment:   In Person  Provider:   You may see [name], MD or one of the following Advanced Practice Providers on your designated Care Team:   Richardson Dopp, PA-C Robbie Lis, [place]   Other Instructions

## 2021-10-16 ENCOUNTER — Telehealth: Payer: Self-pay

## 2021-10-16 NOTE — Telephone Encounter (Signed)
? ? ?  Name: Terry GUDIEL Sr.  ?DOB: 10-21-1955  ?MRN: [id number] ? ?Primary Cardiologist: [name], MD ? ? ?Preoperative team, please contact this patient and set up a phone call appointment for further preoperative risk assessment. Please obtain consent and complete medication review. Thank you for your help. ? ?No blood thinners listed on MAR, last OV 03/2021. ? ? ? ?Charlie Pitter, PA-C ?10/16/2021, 2:37 PM ?[facility] ?[address] ?[place], [place] [address] ?  ?

## 2021-10-16 NOTE — Telephone Encounter (Signed)
? ?  Pre-operative Risk Assessment  ?  ?Patient Name: Terry TONER Sr.  ?DOB: [birth year] ?MRN: [id number]  ? ?  ? ?Request for Surgical Clearance   ? ?Procedure:   Left Knee scope with PMM/PLM ? ?Date of Surgery:  Clearance TBD                              ?   ?Surgeon:  Dr Victorino December ?Surgeon's Group or Practice Name:  [facility] ?Phone number:  [phone] ?Fax number:  [phone] Marianna Fuss Maze)  ?  ?Type of Clearance Requested:   ?- Medical  ?  ?Type of Anesthesia:   Choice ?  ?Additional requests/questions:   n/a ? ?Signed, ?Ulice Brilliant T   ?10/16/2021, 2:21 PM  ? ?

## 2021-10-18 ENCOUNTER — Telehealth: Payer: Self-pay | Admitting: *Deleted

## 2021-10-18 ENCOUNTER — Ambulatory Visit (INDEPENDENT_AMBULATORY_CARE_PROVIDER_SITE_OTHER): Payer: 59 | Admitting: Physician Assistant

## 2021-10-18 DIAGNOSIS — Z01810 Encounter for preprocedural cardiovascular examination: Secondary | ICD-10-CM | POA: Diagnosis not present

## 2021-10-18 NOTE — Telephone Encounter (Signed)
Pt agreeable to plan of care for tele pre op appt today @ 1 pm. Med rec and consent are done.  ?

## 2021-10-18 NOTE — Progress Notes (Signed)
? ?Virtual Visit via Telephone Note  ? ?This visit type was conducted due to national recommendations for restrictions regarding the COVID-19 Pandemic (e.g. social distancing) in an effort to limit this patient's exposure and mitigate transmission in our community.  Due to his co-morbid illnesses, this patient is at least at moderate risk for complications without adequate follow up.  This format is felt to be most appropriate for this patient at this time.  The patient did not have access to video technology/had technical difficulties with video requiring transitioning to audio format only (telephone).  All issues noted in this document were discussed and addressed.  No physical exam could be performed with this format.  Please refer to the patient's chart for his  consent to telehealth for [facility]. ? ?Evaluation Performed:  Preoperative cardiovascular risk assessment ?_____________  ? ?Date:  10/18/2021  ? ?Patient ID:  Terry KOPPEN Sr., DOB [birth year], MRN [id number] ?Patient Location:  ?Home ?Provider location:   ?Office ? ?Primary Care Provider:  Shirline Frees, MD ?Primary Cardiologist:  [name], MD ? ?Chief Complaint  ?  ?66 y.o. y/o male with a h/o HTN, NSVT, HFrEF, and negative NST, who is pending Left Knee scope with PMM/PLM, and presents today for telephonic preoperative cardiovascular risk assessment. ? ?Past Medical History  ?  ?Past Medical History:  ?Diagnosis Date  ? Abnormal kidney function   ? sees [place] kidney x 2   ? Anemia   ? Cancer [facility]) dx dec 2019  ? prostate  ? Cardiomegaly   ? Chronic kidney disease   ? Dysrhythmia   ? Hypertension   ? Pneumonia 1993  ? Sinus complaint   ? ?Past Surgical History:  ?Procedure Laterality Date  ? ELBOW SURGERY Right 05/2018  ? PELVIC LYMPH NODE DISSECTION Bilateral 11/14/2018  ? Procedure: PELVIC LYMPH NODE DISSECTION;  Surgeon: Ardis Hughs, MD;  [facility];  Service: Urology;  Laterality: Bilateral;  ? pleurisy  1993  ? Right  lung surgery   ? PROSTATE BIOPSY    ? ROBOT ASSISTED LAPAROSCOPIC RADICAL PROSTATECTOMY N/A 11/14/2018  ? Procedure: XI ROBOTIC ASSISTED LAPAROSCOPIC RADICAL PROSTATECTOMY;  Surgeon: Ardis Hughs, MD;  [facility];  Service: Urology;  Laterality: N/A;  ? ? ?Allergies ? ?No Known Allergies ? ?History of Present Illness  ?  ?Terry R [place]. is a 67 y.o. male who presents via audio/video conferencing for a telehealth visit today.  Pt was last seen in cardiology clinic on 03/10/21 by Dr. Acie Fredrickson.  At that time Terry Nephew Sr. was doing well.  The patient is now pending Left Knee scope with PMM/PLM.  Since his last visit, he remains active with exercise (weight lifting, cardio, playing basketball) without angina.  ?Last stress test was 2016 and showed a very small area of attenuation in the basal inferior wall. In the absence of symptoms but with CKD and creatinine > 2, definitive angiography has been deferred. ? ? ?Home Medications  ?  ?Prior to Admission medications   ?Medication Sig Start Date End Date Taking? Authorizing Provider  ?amLODipine (NORVASC) 10 MG tablet Take 10 mg by mouth at bedtime. 01/22/21   [provider]  ?Carboxymethylcellul-Glycerin 1-0.25 % SOLN Place 1 drop into both eyes 2 (two) times daily as needed (dry eyes).    [provider]  ?hydrALAZINE (APRESOLINE) 25 MG tablet Take 75 mg by mouth 3 (three) times daily. 10/02/19   [provider]  ?hydrochlorothiazide (MICROZIDE) 12.5 MG  capsule Take 12.5 mg by mouth daily. 02/16/21   [provider]  ?losartan (COZAAR) 50 MG tablet Take 25 mg by mouth daily. 01/22/21   [provider]  ?Multiple Vitamins-Minerals (EQ MULTIVITAMINS ADULT GUMMY PO) Take by mouth. 2 GUMMIES 2 TIMES DAILY    [provider]  ?Vitamin D, Cholecalciferol, 25 MCG (1000 UT) TABS Take 1 tablet by mouth daily.    [provider]  ? ? ?Physical Exam  ?  ?Vital Signs:  Terry R Bridwell Sr. does not have vital  signs available for review today. ? ?Given telephonic nature of communication, physical exam is limited. ?AAOx3. NAD. Normal affect.  Speech and respirations are unlabored. ? ?Accessory Clinical Findings  ?  ?None ? ?Assessment & Plan  ?  ?1.  Preoperative Cardiovascular Risk Assessment: ? ?Given hx of CHF and CKD, he has a 6.6% risk of MACE. He remains active, as above, without angina. He understands this risk and wishes to proceed. I do not think additional testing at this time would mitigate his risk.  ? ?Therefore, based on ACC/AHA guidelines, the patient would be at acceptable risk for the planned procedure without further cardiovascular testing.  ? ?The patient was advised that if he develops new symptoms prior to surgery to contact our office to arrange for a follow-up visit, and he verbalized understanding. ? ? ? ? ?A copy of this note will be routed to requesting surgeon. ? ?Time:   ?Today, I have spent 10 minutes with the patient with telehealth technology discussing medical history, symptoms, and management plan.   ? ? ?Ledora Bottcher, PA ? ?10/18/2021, 1:50 PM ?

## 2021-10-18 NOTE — Telephone Encounter (Signed)
Pt agreeable to plan of care for tele pre op appt today @ 1 pm. Med rec and consent are done.  ? ?  ?Patient Consent for Virtual Visit  ? ? ?   ? ?Terry R Brosky Sr. has provided verbal consent on 10/18/2021 for a virtual visit (video or telephone). ? ? ?CONSENT FOR VIRTUAL VISIT FOR:  Terry R Fetherolf Sr.  ?By participating in this virtual visit I agree to the following: ? ?I hereby voluntarily request, consent and authorize [facility] and its employed or contracted physicians, physician assistants, nurse practitioners or other licensed health care professionals (the Practitioner), to provide me with telemedicine health care services (the ?Services") as deemed necessary by the treating Practitioner. I acknowledge and consent to receive the Services by the Practitioner via telemedicine. I understand that the telemedicine visit will involve communicating with the Practitioner through live audiovisual communication technology and the disclosure of certain medical information by electronic transmission. I acknowledge that I have been given the opportunity to request an in-person assessment or other available alternative prior to the telemedicine visit and am voluntarily participating in the telemedicine visit. ? ?I understand that I have the right to withhold or withdraw my consent to the use of telemedicine in the course of my care at any time, without affecting my right to future care or treatment, and that the Practitioner or I may terminate the telemedicine visit at any time. I understand that I have the right to inspect all information obtained and/or recorded in the course of the telemedicine visit and may receive copies of available information for a reasonable fee.  I understand that some of the potential risks of receiving the Services via telemedicine include:  ?Delay or interruption in medical evaluation due to technological equipment failure or disruption; ?Information transmitted may not be sufficient  (e.g. poor resolution of images) to allow for appropriate medical decision making by the Practitioner; and/or  ?In rare instances, security protocols could fail, causing a breach of personal health information. ? ?Furthermore, I acknowledge that it is my responsibility to provide information about my medical history, conditions and care that is complete and accurate to the best of my ability. I acknowledge that Practitioner's advice, recommendations, and/or decision may be based on factors not within their control, such as incomplete or inaccurate data provided by me or distortions of diagnostic images or specimens that may result from electronic transmissions. I understand that the practice of medicine is not an exact science and that Practitioner makes no warranties or guarantees regarding treatment outcomes. I acknowledge that a copy of this consent can be made available to me via my patient portal ([facility]), or I can request a printed copy by calling the office of [facility].   ? ?I understand that my insurance will be billed for this visit.  ? ?I have read or had this consent read to me. ?I understand the contents of this consent, which adequately explains the benefits and risks of the Services being provided via telemedicine.  ?I have been provided ample opportunity to ask questions regarding this consent and the Services and have had my questions answered to my satisfaction. ?I give my informed consent for the services to be provided through the use of telemedicine in my medical care ? ? ? ?

## 2022-03-08 ENCOUNTER — Encounter: Payer: Self-pay | Admitting: Cardiovascular Disease

## 2022-03-08 ENCOUNTER — Ambulatory Visit: Payer: 59 | Attending: Cardiovascular Disease | Admitting: Cardiovascular Disease

## 2022-03-08 VITALS — BP 135/75 | HR 64 | Ht 72.0 in | Wt 178.2 lb

## 2022-03-08 DIAGNOSIS — I5032 Chronic diastolic (congestive) heart failure: Secondary | ICD-10-CM | POA: Diagnosis not present

## 2022-03-08 DIAGNOSIS — Z01810 Encounter for preprocedural cardiovascular examination: Secondary | ICD-10-CM | POA: Diagnosis not present

## 2022-03-08 NOTE — Patient Instructions (Signed)
Medication Instructions:  Your physician recommends that you continue on your current medications as directed. Please refer to the Current Medication list given to you today.  *If you need a refill on your cardiac medications before your next appointment, please call your pharmacy*   Lab Work: NONE If you have labs (blood work) drawn today and your tests are completely normal, you will receive your results only by: [place] (if you have MyChart) OR A paper copy in the mail If you have any lab test that is abnormal or we need to change your treatment, we will call you to review the results.   Testing/Procedures: Cardiac MRI Your physician has requested that you have a cardiac MRI. Cardiac MRI uses a computer to create images of your heart as its beating, producing both still and moving pictures of your heart and major blood vessels. For further information please visit [email or web address] Please follow the instruction sheet given to you today for more information.   Follow-Up: At [facility], you and your health needs are our priority.  As part of our continuing mission to provide you with exceptional heart care, we have created designated Provider Care Teams.  These Care Teams include your primary Cardiologist (physician) and Advanced Practice Providers (APPs -  Physician Assistants and Nurse Practitioners) who all work together to provide you with the care you need, when you need it.  We recommend signing up for the patient portal called "MyChart".  Sign up information is provided on this After Visit Summary.  MyChart is used to connect with patients for Virtual Visits (Telemedicine).  Patients are able to view lab/test results, encounter notes, upcoming appointments, etc.  Non-urgent messages can be sent to your provider as well.   To learn more about what you can do with MyChart, go to [email or web address].    Your next appointment:   1 year(s)  The format for your  next appointment:   In Person  Provider:   [name], MD       Important Information About Sugar

## 2022-03-08 NOTE — Progress Notes (Signed)
Cardiology Office Note   Date:  03/08/2022   ID:  Terry Nephew Sr., DOB 1955-10-11, MRN [id number]  PCP:  Shirline Frees, MD  Cardiologist:   [name], MD   No chief complaint on file.  Problem List: 1. Hypertension 2. Nonsustained ventricular tachycardia 3. Chronic systolic congestive heart failure-ejection fraction of 40% by stress Myoview study in 2009  EF 25-30% by echo 2016.    EF has normalized by echo in July  2019    The patient is a middle-aged gentleman with a history of hypertension,   palpitations, and nonsustained ventricular tachycardia.   The patient has been followed for many years for mildly dilated left   ventricle. He has also had some nonsustained ventricular tachycardia.   He has had numerous stress test. All of which have been negative.  Oct. 27, 2015:  Terry Moss is seen today after ~6 year absence.   He was followed for years for mild systolic CHF, and NSVT.   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 No smptoms. Had several stress tests for years from 2000 to 2009 - all of which were negative.   He exercises regularly without any difficutly.  He denies any syncope or pre-syncope.  Jan. 4, 2016: Terry Moss is seen back today for follow up of his CHF. He has had HTN for years.   He was seen in Oct. After a multiple year absence.   He is on the full dose of hydralazine and imdur He eats out quite a bit.  He has significant DOE after taking the hydralazine and Imdur.   Struggles while playing basketball or running  Jan. 25, 2016:  BP is much better. May be slightly too low at times.  Has symptoms of orthostasis on occasion.     September 20, 2014:   Terry R Sirius Woodford. is a 66 y.o. male who presents for follow up of his CHF. We had arranged for him to have a cardiac cath but it was cancelled due to a bump in his creatinine.  He has known CKD.  Breathing is ok Still runs on a regular basis.  Nov 08, 2014: Terry Moss  continues to do very well. He still exercises on a regular basis. We originally had scheduled him for cardiac cath but canceled as of a  creatinine is still around 2.0. He denies any chest pain. His breathing is okay.   Sept. 13, 2016:   Terry Moss is doing ok Has chronic systolic CHF.  Did not have his cath due to CKD Still very active, plays basketball regularly without any symptoms .   Dec. 6, 2016:  Terry Moss has some chest pain  - center of his chest , radiated to the right side.  Felt more muscular after several hours.  Now goes into his back  Takes his BP at home.  Typically 110s- 125 range / 70-80s.  Jan. 9, 2018:  Doing well.  Still working out. Has not had the cath yet.   Echocardiogram in May, 2016 revealed an improved left ventricle systolic function with an EF of 45-50%. His previous EF had been as low as 20%. Still eating some salty foods.  Still eats out 3-4 times a week.    Oct. 1, 2018:  Terry Moss is doing well.   No CP or dyspnea..  Plays basketball twice a week .  Still eating out 3-4 times a week   October 14, 2017:  Terry Moss is seen today ,   No dyspnea or CP . Has been having some issues with gout.  BP is typically well controlled.  Is elevated today .   He has been stressed.    May 01, 2018: Terry Moss is seen today  Has had some dizziness after taking the hydralazine and Imdur - which he takes TID  His LV function has improved on this regimin Scheduled for right elbow surgery soon.   January 13, 2019: Terry Moss is seen today for follow-up visit.  The blood pressure is well controlled.  His heart rate is chronically slow.  Had prostate surgery in May  ( robotic surgery)    October 09, 2019: Terry Moss  is doing well.  Is not had any worsening chest pain or shortness of breath.  We stopped his isosorbide a month or so ago.  He would like to try cialis   Sept. 9, 2022: Terry Moss is seen back today for follow up of his CHF.   Was having some DOE earlier this year. Is  back working out now  [employer] last week .   Echo from 2019 shows normal LV EF . Moderate LVH  Is exercising , is doing all that he wants to do without any DOE .  Last creatinine I can find is from June, 2022 - 2.13   Sept. 7, 2023  Terry Moss is doing well. Is very active Plays basketball  Walks / runs  Jumps rope, dumb bells.    Past Medical History:  Diagnosis Date   Abnormal kidney function    sees [place] kidney x 2    Anemia    Cancer ([place]) dx dec 2019   prostate   Cardiomegaly    Chronic kidney disease    Dysrhythmia    Hypertension    Pneumonia 1993   Sinus complaint     Past Surgical History:  Procedure Laterality Date   ELBOW SURGERY Right 05/2018   PELVIC LYMPH NODE DISSECTION Bilateral 11/14/2018   Procedure: PELVIC LYMPH NODE DISSECTION;  Surgeon: Ardis Hughs, MD;  Location: [facility];  Service: Urology;  Laterality: Bilateral;   pleurisy  1993   Right lung surgery    PROSTATE BIOPSY     ROBOT ASSISTED LAPAROSCOPIC RADICAL PROSTATECTOMY N/A 11/14/2018   Procedure: XI ROBOTIC ASSISTED LAPAROSCOPIC RADICAL PROSTATECTOMY;  Surgeon: Ardis Hughs, MD;  Location: [facility];  Service: Urology;  Laterality: N/A;     Current Outpatient Medications  Medication Sig Dispense Refill   amLODipine (NORVASC) 10 MG tablet Take 10 mg by mouth at bedtime.     Carboxymethylcellul-Glycerin 1-0.25 % SOLN Place 1 drop into both eyes 2 (two) times daily as needed (dry eyes).     hydrALAZINE (APRESOLINE) 25 MG tablet Take 75 mg by mouth 3 (three) times daily.     hydrochlorothiazide (MICROZIDE) 12.5 MG capsule Take 12.5 mg by mouth daily.     losartan (COZAAR) 50 MG tablet Take 50 mg by mouth daily.     Multiple Vitamins-Minerals (EQ MULTIVITAMINS ADULT GUMMY PO) Take by mouth. 2 GUMMIES 2 TIMES DAILY     Vitamin D, Cholecalciferol, 25 MCG (1000 UT) TABS Take 1 tablet by mouth daily.     No current facility-administered medications for this visit.    Allergies:   Patient  has no known allergies.    Social History:  The patient  reports that he has never smoked. He has never used smokeless tobacco. He reports current alcohol use. He  reports that he does not use drugs.   Family History:  The patient's family history includes Heart attack in his brother; Heart disease in his brother, father, and sister; Hypertension in his mother; Stroke in his brother, father, and mother.    ROS:   Noted in current history, all other systems are negative.  Physical Exam: Blood pressure 135/75, pulse 64, height 6' (1.829 m), weight 178 lb 3.2 oz (80.8 kg), SpO2 98 %.       GEN:  Well nourished, well developed in no acute distress HEENT: Normal NECK: No JVD; No carotid bruits LYMPHATICS: No lymphadenopathy CARDIAC: RRR , soft systolic murmur , , frequent premature beats  RESPIRATORY:  Clear to auscultation without rales, wheezing or rhonchi  ABDOMEN: Soft, non-tender, non-distended MUSCULOSKELETAL:  No edema; No deformity  SKIN: Warm and dry NEUROLOGIC:  Alert and oriented x 3    EKG:    March 08, 2022: Normal sinus rhythm at 64.  Marked sinus arrhythmia with premature atrial contractions.  Left ventricular hypertrophy with QRS widening   Recent Labs: No results found for requested labs within last 365 days.    Lipid Panel    Component Value Date/Time   CHOL 176 04/01/2017 1549   TRIG 94 04/01/2017 1549   HDL 61 04/01/2017 1549   CHOLHDL 2.9 04/01/2017 1549   LDLCALC 96 04/01/2017 1549      Wt Readings from Last 3 Encounters:  03/08/22 178 lb 3.2 oz (80.8 kg)  03/10/21 174 lb 9.6 oz (79.2 kg)  10/09/19 168 lb 8 oz (76.4 kg)      Other studies Reviewed: Additional studies/ records that were reviewed today include: . Review of the above records demonstrates:    ASSESSMENT AND PLAN:  1. Hypertension -    blood pressure is better after several minute wait.      2.  PVCs/ Nonsustained ventricular tachycardia-     3. Chronic combined systolic  and diastolic congestive heart failure-   I would like to get a cardiac MRI for further evaluation.  He has moderate LVH.  He has ventricular arrhythmias and chronic diastolic dysfunction.  I think a cardiac MRI would be useful in making sure that he does not have an unusual infiltrative cardiac process.   Will see him in 1 year.     Current medicines are reviewed at length with the patient today.  The patient does not have concerns regarding medicines.  The following changes have been made:  See above.   Labs/ tests ordered today include: BMP    Orders Placed This Encounter  Procedures   MR CARDIAC MORPHOLOGY W WO CONTRAST   Basic metabolic panel   EKG [id number]     Disposition:   FU with me in 1 year   Signed, [name], MD  03/08/2022 6:20 PM    [facility] [place], [place], [place]  [address] Phone: [phone]; Fax: [phone]

## 2022-03-09 LAB — BASIC METABOLIC PANEL
BUN/Creatinine Ratio: 15 (ref 10–24)
BUN: 35 mg/dL — ABNORMAL HIGH (ref 8–27)
CO2: 21 mmol/L (ref 20–29)
Calcium: 9.4 mg/dL (ref 8.6–10.2)
Chloride: 103 mmol/L (ref 96–106)
Creatinine, Ser: 2.37 mg/dL — ABNORMAL HIGH (ref 0.76–1.27)
Glucose: 98 mg/dL (ref 70–99)
Potassium: 4.2 mmol/L (ref 3.5–5.2)
Sodium: 141 mmol/L (ref 134–144)
eGFR: 29 mL/min/{1.73_m2} — ABNORMAL LOW (ref >59)

## 2022-05-25 ENCOUNTER — Telehealth (HOSPITAL_COMMUNITY): Payer: Self-pay | Admitting: *Deleted

## 2022-05-25 NOTE — Telephone Encounter (Signed)
Attempted to call patient regarding upcoming cardiac MRI appointment. Left message on voicemail with name and callback number  Deshay Kirstein RN Navigator Cardiac Imaging [facility] [phone] Office [phone] Cell  

## 2022-05-29 ENCOUNTER — Ambulatory Visit (HOSPITAL_COMMUNITY): Admission: RE | Admit: 2022-05-29 | Payer: 59 | Source: Ambulatory Visit

## 2022-06-07 IMAGING — CR DG CHEST 2V
2 series · 2 of 2 positions shown · non-contrast
Comparison: 12/25/2014

CLINICAL DATA: Shortness of breath 2 months.

EXAM:
CHEST - 2 VIEW

[w chest pa]
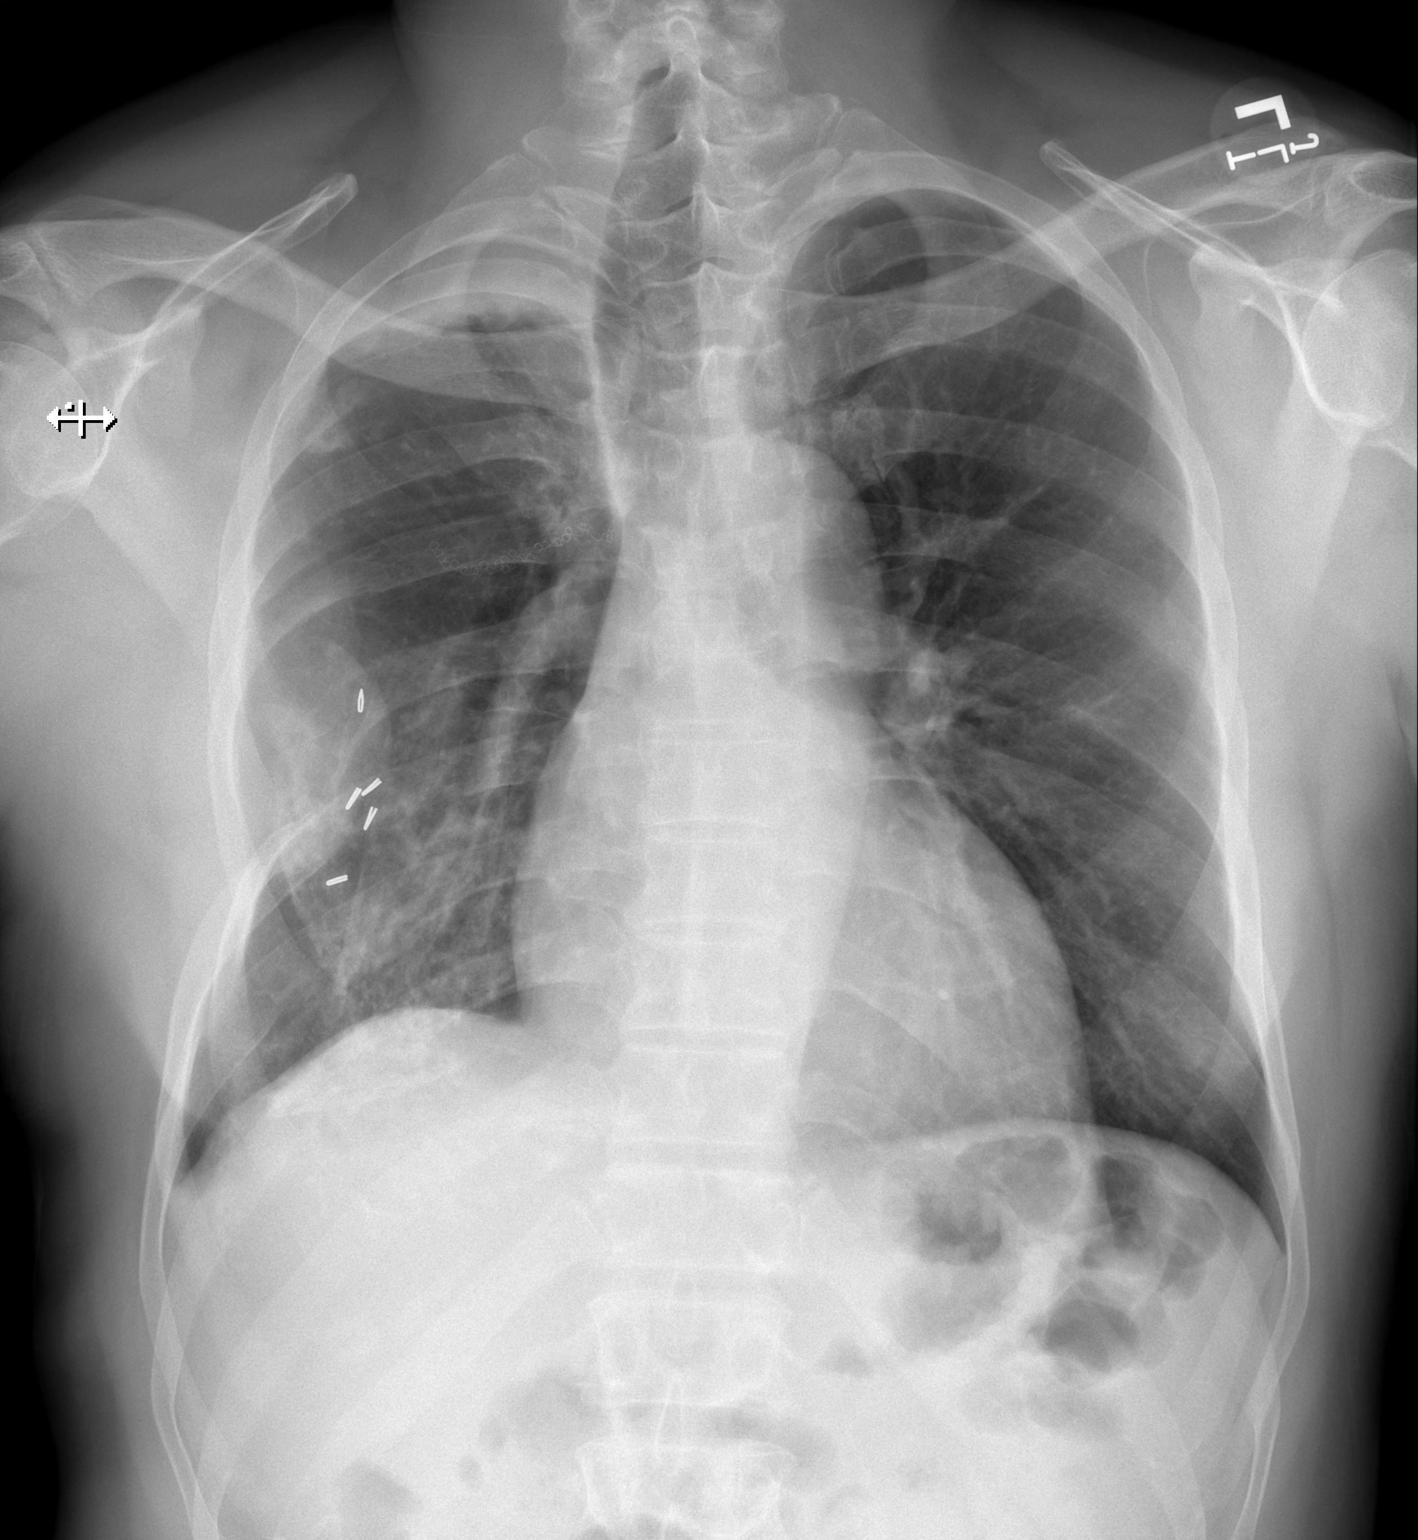

[w chest lat]
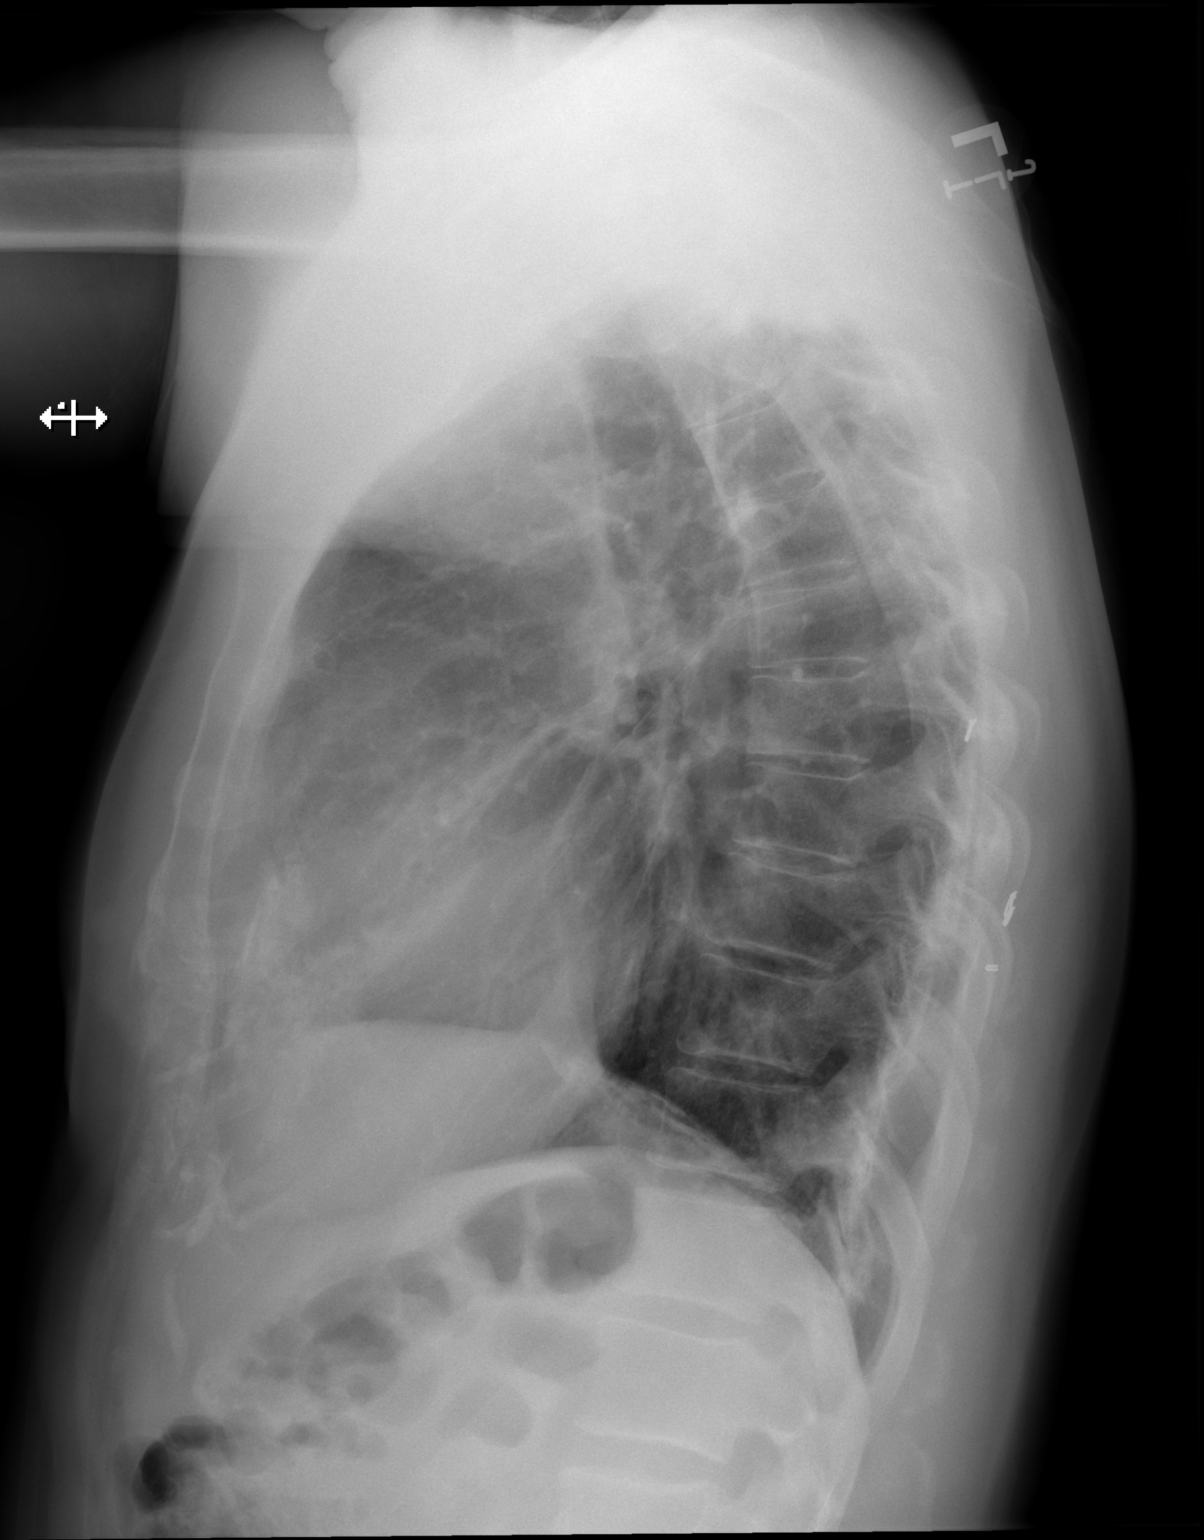

[2 of 2 positions shown; findings below may reference images not displayed]

FINDINGS: Lungs are adequately inflated with stable postsurgical changes of
the right lung/thorax. Left lung is clear. Cardiomediastinal
silhouette and remainder of the exam is unchanged.
IMPRESSION: No acute cardiopulmonary disease.

## 2023-09-26 ENCOUNTER — Encounter: Payer: Self-pay | Admitting: Cardiovascular Disease

## 2023-09-26 NOTE — Progress Notes (Unsigned)
 Cardiology Office Note   Date:  09/27/2023   ID:  Terry CURFMAN Sr., DOB 02/12/1956, MRN [id number]  PCP:  Terry Retort, MD  Cardiologist:   Terry Miss, MD   Chief Complaint  Patient presents with   Congestive Heart Failure        Hyperlipidemia   Problem List: 1. Hypertension 2. Nonsustained ventricular tachycardia 3. Chronic systolic congestive heart failure-ejection fraction of 40% by stress Myoview study in 2009  EF 25-30% by echo 2016.    EF has normalized by echo in July  2019    The patient is a middle-aged gentleman with a history of hypertension,   palpitations, and nonsustained ventricular tachycardia.   The patient has been followed for many years for mildly dilated left   ventricle. He has also had some nonsustained ventricular tachycardia.   He has had numerous stress test. All of which have been negative.  Oct. 27, 2015:  Terry Moss is seen today after ~6 year absence.   He was followed for years for mild systolic CHF, and NSVT.   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 No smptoms. Had several stress tests for years from 2000 to 2009 - all of which were negative.   He exercises regularly without any difficutly.  He denies any syncope or pre-syncope.  Jan. 4, 2016: Terry Moss is seen back today for follow up of his CHF. He has had HTN for years.   He was seen in Oct. After a multiple year absence.   He is on the full dose of hydralazine and imdur He eats out quite a bit.  He has significant DOE after taking the hydralazine and Imdur.   Struggles while playing basketball or running  Jan. 25, 2016:  BP is much better. May be slightly too low at times.  Has symptoms of orthostasis on occasion.     September 20, 2014:   Terry Moss. is a 68 y.o. male who presents for follow up of his CHF. We had arranged for him to have a cardiac cath but it was cancelled due to a bump in his creatinine.  He has known CKD.  Breathing  is ok Still runs on a regular basis.  Nov 08, 2014: Terry Moss continues to do very well. He still exercises on a regular basis. We originally had scheduled him for cardiac cath but canceled as of a  creatinine is still around 2.0. He denies any chest pain. His breathing is okay.   Sept. 13, 2016:   Terry Moss is doing ok Has chronic systolic CHF.  Did not have his cath due to CKD Still very active, plays basketball regularly without any symptoms .   Dec. 6, 2016:  Terry Moss has some chest pain  - center of his chest , radiated to the right side.  Felt more muscular after several hours.  Now goes into his back  Takes his BP at home.  Typically 110s- 125 range / 70-80s.  Jan. 9, 2018:  Doing well.  Still working out. Has not had the cath yet.   Echocardiogram in May, 2016 revealed an improved left ventricle systolic function with an EF of 45-50%. His previous EF had been as low as 20%. Still eating some salty foods.  Still eats out 3-4 times a week.    Oct. 1, 2018:  Terry Moss is doing well.   No CP or dyspnea..  Plays basketball  twice a week .  Still eating out 3-4 times a week   October 14, 2017:  Terry Moss is seen today ,   No dyspnea or CP . Has been having some issues with gout.  BP is typically well controlled.  Is elevated today .   He has been stressed.    May 01, 2018: Terry Moss is seen today  Has had some dizziness after taking the hydralazine and Imdur - which he takes TID  His LV function has improved on this regimin Scheduled for right elbow surgery soon.   January 13, 2019: Terry Moss is seen today for follow-up visit.  The blood pressure is well controlled.  His heart rate is chronically slow.  Had prostate surgery in May  ( robotic surgery)    October 09, 2019: Terry Moss  is doing well.  Is not had any worsening chest pain or shortness of breath.  We stopped his isosorbide a month or so ago.  He would like to try cialis   Sept. 9, 2022: Terry Moss is seen back today for follow  up of his CHF.   Was having some DOE earlier this year. Is back working out now  [employer] last week .   Echo from 2019 shows normal LV EF . Moderate LVH  Is exercising , is doing all that he wants to do without any DOE .  Last creatinine I can find is from June, 2022 - 2.13   Sept. 7, 2023  Terry Moss is doing well. Is very active Plays basketball  Walks / runs  Jumps rope, dumb bells.   September 27, 2023 Terry Moss is seen for follow up of his mild LVH and CHF Is still very active  Having some knee pain , has gout in his knee   He is 1 of 12 children in his family  He and his older sister are the only ones who do not have a pacer.      Past Medical History:  Diagnosis Date   Abnormal kidney function    sees Terry Moss kidney x 2    Anemia    Cancer ([facility]) dx dec 2019   prostate   Cardiomegaly    Chronic kidney disease    Dysrhythmia    Hypertension    Pneumonia 1993   Sinus complaint     Past Surgical History:  Procedure Laterality Date   ELBOW SURGERY Right 05/2018   PELVIC LYMPH NODE DISSECTION Bilateral 11/14/2018   Procedure: PELVIC LYMPH NODE DISSECTION;  Surgeon: Terry Fat, MD;  Location: [facility];  Service: Urology;  Laterality: Bilateral;   pleurisy  1993   Right lung surgery    PROSTATE BIOPSY     ROBOT ASSISTED LAPAROSCOPIC RADICAL PROSTATECTOMY N/A 11/14/2018   Procedure: XI ROBOTIC ASSISTED LAPAROSCOPIC RADICAL PROSTATECTOMY;  Surgeon: Terry Fat, MD;  Location: [facility];  Service: Urology;  Laterality: N/A;     Current Outpatient Medications  Medication Sig Dispense Refill   amLODipine (NORVASC) 10 MG tablet Take 10 mg by mouth at bedtime.     Carboxymethylcellul-Glycerin 1-0.25 % SOLN Place 1 drop into both eyes 2 (two) times daily as needed (dry eyes).     colchicine 0.6 MG tablet Take 0.6 mg by mouth 2 (two) times daily as needed.     fluticasone (FLONASE) 50 MCG/ACT nasal spray Place 2 sprays into both nostrils daily.     hydrALAZINE  (APRESOLINE) 25 MG tablet Take 75 mg by mouth 3 (three) times daily.  hydrochlorothiazide (MICROZIDE) 12.5 MG capsule Take 12.5 mg by mouth daily.     losartan (COZAAR) 50 MG tablet Take 50 mg by mouth daily.     Multiple Vitamins-Minerals (EQ MULTIVITAMINS ADULT GUMMY PO) Take by mouth. 2 GUMMIES 2 TIMES DAILY     tadalafil (CIALIS) 20 MG tablet      Vitamin D, Cholecalciferol, 25 MCG (1000 UT) TABS Take 1 tablet by mouth daily. 2000 UT     No current facility-administered medications for this visit.    Allergies:   Patient has no known allergies.    Social History:  The patient  reports that he has never smoked. He has never used smokeless tobacco. He reports current alcohol use. He reports that he does not use drugs.   Family History:  The patient's family history includes Heart attack in his brother; Heart disease in his brother, father, and sister; Hypertension in his mother; Stroke in his brother, father, and mother.    ROS:   Noted in current history, all other systems are negative.   Physical Exam: Blood pressure 110/62, pulse (!) 38, height 6' (1.829 m), weight 172 lb 3.2 oz (78.1 kg), SpO2 98%.       GEN:  Well nourished, well developed in no acute distress HEENT: Normal NECK: No JVD; No carotid bruits LYMPHATICS: No lymphadenopathy CARDIAC: RRR  2/6 systolic murmur ,  frequent premature beats   RESPIRATORY:  Clear to auscultation without rales, wheezing or rhonchi  ABDOMEN: Soft, non-tender, non-distended MUSCULOSKELETAL:  No edema; No deformity  SKIN: Warm and dry NEUROLOGIC:  Alert and oriented x 3    EKG:           Recent Labs: No results found for requested labs within last 365 days.    Lipid Panel    Component Value Date/Time   CHOL 176 04/01/2017 1549   TRIG 94 04/01/2017 1549   HDL 61 04/01/2017 1549   CHOLHDL 2.9 04/01/2017 1549   LDLCALC 96 04/01/2017 1549      Wt Readings from Last 3 Encounters:  09/27/23 172 lb 3.2 oz (78.1 kg)   03/08/22 178 lb 3.2 oz (80.8 kg)  03/10/21 174 lb 9.6 oz (79.2 kg)      Other studies Reviewed: Additional studies/ records that were reviewed today include: . Review of the above records demonstrates:    ASSESSMENT AND PLAN:  1. Hypertension -    BP is well controlled.   2.  PVCs/ Nonsustained ventricular tachycardia-     3. Chronic combined systolic and diastolic congestive heart failure-we had discussed getting a cardiac MRI when I saw him in 2023.  At the time his insurance would not cover it.  He has a history of chronic combined systolic and diastolic congestive heart failure.  He also has left ventricular hypertrophy.  I would like to know whether he has some sort of infiltrative process going on.  Will see if we get an MRI approved.  Is an interesting note, he is 1 of 12 children.  10 of his brothers and sisters all have pacemakers.            Current medicines are reviewed at length with the patient today.  The patient does not have concerns regarding medicines.  The following changes have been made:  See above.   Labs/ tests ordered today include: BMP    Orders Placed This Encounter  Procedures   EKG 12-Lead     Disposition:   F  Signed, Terry Miss, MD  09/27/2023 3:41 PM    [facility] [address] [place], [place], [place]  [address] Phone: [phone]; Fax: [phone]

## 2023-09-27 ENCOUNTER — Encounter: Payer: Self-pay | Admitting: Cardiovascular Disease

## 2023-09-27 ENCOUNTER — Ambulatory Visit: Payer: 59 | Attending: Cardiovascular Disease | Admitting: Cardiovascular Disease

## 2023-09-27 VITALS — BP 110/62 | HR 38 | Ht 72.0 in | Wt 172.2 lb

## 2023-09-27 DIAGNOSIS — I517 Cardiomegaly: Secondary | ICD-10-CM | POA: Diagnosis not present

## 2023-09-27 DIAGNOSIS — I493 Ventricular premature depolarization: Secondary | ICD-10-CM | POA: Insufficient documentation

## 2023-09-27 DIAGNOSIS — I5042 Chronic combined systolic (congestive) and diastolic (congestive) heart failure: Secondary | ICD-10-CM | POA: Insufficient documentation

## 2023-09-27 NOTE — Patient Instructions (Signed)
 Lab Work: CBC today If you have labs (blood work) drawn today and your tests are completely normal, you will receive your results only by: MyChart Message (if you have MyChart) OR A paper copy in the mail If you have any lab test that is abnormal or we need to change your treatment, we will call you to review the results.  Testing/Procedures: Cardiac MRI Your physician has requested that you have a cardiac MRI. Cardiac MRI uses a computer to create images of your heart as its beating, producing both still and moving pictures of your heart and major blood vessels. For further information please visit [email or web address]. Please follow the instruction sheet given to you today for more information.  Follow-Up: At [facility], you and your health needs are our priority.  As part of our continuing mission to provide you with exceptional heart care, our providers are all part of one team.  This team includes your primary Cardiologist (physician) and Advanced Practice Providers or APPs (Physician Assistants and Nurse Practitioners) who all work together to provide you with the care you need, when you need it.  Your next appointment:   1 year(s)  Provider:   Nathaniel Man, MD    We recommend signing up for the patient portal called "MyChart".  Sign up information is provided on this After Visit Summary.  MyChart is used to connect with patients for Virtual Visits (Telemedicine).  Patients are able to view lab/test results, encounter notes, upcoming appointments, etc.  Non-urgent messages can be sent to your provider as well.   To learn more about what you can do with MyChart, go to [email or web address].   Other Instructions   You are scheduled for Cardiac MRI at the location below.  Please arrive for your appointment at ______________ . ?  [facility] [address] [place], [place] [address] Please take advantage of the free valet parking available at the [facility] and  [employer] (Entrance C).  Proceed to the [facility] Radiology Department (First Floor) for check-in.   OR   [facility] [address] [place], [place] [address] Please go to the [facility] and check-in with the desk attendant.   Magnetic resonance imaging (MRI) is a painless test that produces images of the inside of the body without using Xrays.  During an MRI, strong magnets and radio waves work together in a Data processing manager to form detailed images.   MRI images may provide more details about a medical condition than X-rays, CT scans, and ultrasounds can provide.  You may be given earphones to listen for instructions.  You may eat a light breakfast and take medications as ordered with the exception of furosemide, hydrochlorothiazide, chlorthalidone or spironolactone (or any other fluid pill). If you are undergoing a stress MRI, please avoid stimulants for 12 hr prior to test. (I.e. Caffeine, nicotine, chocolate, or antihistamine medications)  If your provider has ordered anti-anxiety medications for this test, then you will need a driver.  An IV will be inserted into one of your veins. Contrast material will be injected into your IV. It will leave your body through your urine within a day. You may be told to drink plenty of fluids to help flush the contrast material out of your system.  You will be asked to remove all metal, including: Watch, jewelry, and other metal objects including hearing aids, hair pieces and dentures. Also wearable glucose monitoring systems (ie. Freestyle Libre and Omnipods) (Braces and fillings  normally are not a problem.)   TEST WILL TAKE APPROXIMATELY 1 HOUR  PLEASE NOTIFY SCHEDULING AT LEAST 24 HOURS IN ADVANCE IF YOU ARE UNABLE TO KEEP YOUR APPOINTMENT. [phone]  For more information and frequently asked questions, please visit our website : [email or web address]  Please call the Cardiac Imaging  Nurse Navigators with any questions/concerns. [phone] Office      1st Floor: - Lobby - Registration  - Pharmacy  - Lab - Cafe  2nd Floor: - PV Lab - Diagnostic Testing (echo, CT, nuclear med)  3rd Floor: - Vacant  4th Floor: - TCTS (cardiothoracic surgery) - AFib Clinic - Structural Heart Clinic - Vascular Surgery  - Vascular Ultrasound  5th Floor: - HeartCare Cardiology (general and EP) - Clinical Pharmacy for coumadin, hypertension, lipid, weight-loss medications, and med management appointments    Valet parking services will be available as well.

## 2023-10-01 DIAGNOSIS — I4892 Unspecified atrial flutter: Secondary | ICD-10-CM

## 2023-10-01 HISTORY — PX: TRANSTHORACIC ECHOCARDIOGRAM: SHX275

## 2023-10-01 HISTORY — DX: Unspecified atrial flutter: I48.92

## 2023-10-21 ENCOUNTER — Emergency Department (HOSPITAL_COMMUNITY)

## 2023-10-21 ENCOUNTER — Other Ambulatory Visit: Payer: Self-pay

## 2023-10-21 ENCOUNTER — Encounter (HOSPITAL_COMMUNITY): Payer: Self-pay | Admitting: Emergency Medicine

## 2023-10-21 ENCOUNTER — Inpatient Hospital Stay (HOSPITAL_COMMUNITY)
Admission: EM | Admit: 2023-10-21 | Discharge: 2023-10-25 | DRG: 280 | Disposition: A | Discharge status: Home / Self Care | Attending: Internal Medicine | Admitting: Internal Medicine

## 2023-10-21 DIAGNOSIS — I214 Non-ST elevation (NSTEMI) myocardial infarction: Principal | ICD-10-CM | POA: Diagnosis present

## 2023-10-21 DIAGNOSIS — X501XXA Overexertion from prolonged static or awkward postures, initial encounter: Secondary | ICD-10-CM

## 2023-10-21 DIAGNOSIS — I429 Cardiomyopathy, unspecified: Secondary | ICD-10-CM | POA: Diagnosis present

## 2023-10-21 DIAGNOSIS — Z823 Family history of stroke: Secondary | ICD-10-CM

## 2023-10-21 DIAGNOSIS — I2510 Atherosclerotic heart disease of native coronary artery without angina pectoris: Secondary | ICD-10-CM | POA: Diagnosis present

## 2023-10-21 DIAGNOSIS — I447 Left bundle-branch block, unspecified: Secondary | ICD-10-CM | POA: Diagnosis present

## 2023-10-21 DIAGNOSIS — R008 Other abnormalities of heart beat: Secondary | ICD-10-CM | POA: Diagnosis present

## 2023-10-21 DIAGNOSIS — Z79899 Other long term (current) drug therapy: Secondary | ICD-10-CM

## 2023-10-21 DIAGNOSIS — I4892 Unspecified atrial flutter: Secondary | ICD-10-CM

## 2023-10-21 DIAGNOSIS — I1A0 Resistant hypertension: Secondary | ICD-10-CM | POA: Diagnosis present

## 2023-10-21 DIAGNOSIS — Z8673 Personal history of transient ischemic attack (TIA), and cerebral infarction without residual deficits: Secondary | ICD-10-CM

## 2023-10-21 DIAGNOSIS — Z7901 Long term (current) use of anticoagulants: Secondary | ICD-10-CM

## 2023-10-21 DIAGNOSIS — I471 Supraventricular tachycardia, unspecified: Secondary | ICD-10-CM | POA: Diagnosis not present

## 2023-10-21 DIAGNOSIS — I10 Essential (primary) hypertension: Secondary | ICD-10-CM | POA: Diagnosis present

## 2023-10-21 DIAGNOSIS — W1830XA Fall on same level, unspecified, initial encounter: Secondary | ICD-10-CM | POA: Diagnosis present

## 2023-10-21 DIAGNOSIS — I5021 Acute systolic (congestive) heart failure: Secondary | ICD-10-CM

## 2023-10-21 DIAGNOSIS — Z888 Allergy status to other drugs, medicaments and biological substances status: Secondary | ICD-10-CM

## 2023-10-21 DIAGNOSIS — I130 Hypertensive heart and chronic kidney disease with heart failure and stage 1 through stage 4 chronic kidney disease, or unspecified chronic kidney disease: Secondary | ICD-10-CM | POA: Diagnosis present

## 2023-10-21 DIAGNOSIS — E1122 Type 2 diabetes mellitus with diabetic chronic kidney disease: Secondary | ICD-10-CM | POA: Diagnosis present

## 2023-10-21 DIAGNOSIS — E785 Hyperlipidemia, unspecified: Secondary | ICD-10-CM | POA: Diagnosis present

## 2023-10-21 DIAGNOSIS — R55 Syncope and collapse: Secondary | ICD-10-CM

## 2023-10-21 DIAGNOSIS — N184 Chronic kidney disease, stage 4 (severe): Secondary | ICD-10-CM | POA: Diagnosis present

## 2023-10-21 DIAGNOSIS — I272 Pulmonary hypertension, unspecified: Secondary | ICD-10-CM | POA: Diagnosis present

## 2023-10-21 DIAGNOSIS — Z881 Allergy status to other antibiotic agents status: Secondary | ICD-10-CM

## 2023-10-21 DIAGNOSIS — I5043 Acute on chronic combined systolic (congestive) and diastolic (congestive) heart failure: Secondary | ICD-10-CM | POA: Diagnosis present

## 2023-10-21 DIAGNOSIS — Z8701 Personal history of pneumonia (recurrent): Secondary | ICD-10-CM

## 2023-10-21 DIAGNOSIS — Z8249 Family history of ischemic heart disease and other diseases of the circulatory system: Secondary | ICD-10-CM

## 2023-10-21 DIAGNOSIS — I252 Old myocardial infarction: Secondary | ICD-10-CM

## 2023-10-21 DIAGNOSIS — R001 Bradycardia, unspecified: Secondary | ICD-10-CM | POA: Diagnosis present

## 2023-10-21 LAB — BASIC METABOLIC PANEL WITH GFR
Anion gap: 5 (ref 5–15)
BUN: 53 mg/dL — ABNORMAL HIGH (ref 8–23)
CO2: 28 mmol/L (ref 22–32)
Calcium: 9.2 mg/dL (ref 8.9–10.3)
Chloride: 105 mmol/L (ref 98–111)
Creatinine, Ser: 2.53 mg/dL — ABNORMAL HIGH (ref 0.61–1.24)
GFR, Estimated: 27 mL/min — ABNORMAL LOW (ref >60)
Glucose, Bld: 92 mg/dL (ref 70–99)
Potassium: 4.4 mmol/L (ref 3.5–5.1)
Sodium: 138 mmol/L (ref 135–145)

## 2023-10-21 LAB — CBC
HCT: 32 % — ABNORMAL LOW (ref 39.0–52.0)
Hemoglobin: 10 g/dL — ABNORMAL LOW (ref 13.0–17.0)
MCH: 27.8 pg (ref 26.0–34.0)
MCHC: 31.3 g/dL (ref 30.0–36.0)
MCV: 88.9 fL (ref 80.0–100.0)
Platelets: 212 10*3/uL (ref 150–400)
RBC: 3.6 MIL/uL — ABNORMAL LOW (ref 4.22–5.81)
RDW: 13.7 % (ref 11.5–15.5)
WBC: 7 10*3/uL (ref 4.0–10.5)
nRBC: 0 % (ref 0.0–0.2)

## 2023-10-21 LAB — TROPONIN I (HIGH SENSITIVITY)
Troponin I (High Sensitivity): 130 ng/L (ref <18)
Troponin I (High Sensitivity): 190 ng/L (ref <18)

## 2023-10-21 LAB — MAGNESIUM: Magnesium: 2.4 mg/dL (ref 1.7–2.4)

## 2023-10-21 MED ORDER — SODIUM CHLORIDE 0.9 % IV BOLUS
1000.0000 mL | Freq: Once | INTRAVENOUS | Status: AC
  Administered 2023-10-21: 1000 mL via INTRAVENOUS

## 2023-10-21 MED ORDER — HEPARIN (PORCINE) 25000 UT/250ML-% IV SOLN
950.0000 [IU]/h | INTRAVENOUS | Status: DC
  Administered 2023-10-22 – 2023-10-24 (×3): 950 [IU]/h via INTRAVENOUS
  Filled 2023-10-21 (×3): qty 250

## 2023-10-21 MED ORDER — ASPIRIN 325 MG PO TABS
325.0000 mg | ORAL_TABLET | Freq: Once | ORAL | Status: AC
  Administered 2023-10-21: 325 mg via ORAL
  Filled 2023-10-21: qty 1

## 2023-10-21 MED ORDER — HEPARIN BOLUS VIA INFUSION
4000.0000 [IU] | Freq: Once | INTRAVENOUS | Status: AC
  Administered 2023-10-22: 4000 [IU] via INTRAVENOUS
  Filled 2023-10-21: qty 4000

## 2023-10-21 NOTE — ED Provider Triage Note (Signed)
 Emergency Medicine Provider Triage Evaluation Note  Terry Helper Sr. , a 68 y.o. male  was evaluated in triage.  Pt complains of chest pain lasting 1 hour today accompanied with lightheadedness where he then "hit the floor." Felt a similar sensation yesterday.  Not currently experiencing any chest pain. Still feeling lightheaded.  Endorses blurry vision during the episode.  Denies fever, headache, shortness of breath, abdominal pain, n/v, LLE, drug use, alcohol use.   Review of Systems  Positive: N/a Negative: N/a  Physical Exam  BP 137/68 (BP Location: Left Arm)   Pulse 60   Temp 98.3 F (36.8 C) (Oral)   Resp 17   SpO2 100%  Gen:   Awake, no distress   Resp:  Normal effort  MSK:   Moves extremities without difficulty  Other:    Medical Decision Making  Medically screening exam initiated at 8:44 PM.  Appropriate orders placed.  Terry R Gittens Sr. was informed that the remainder of the evaluation will be completed by another provider, this initial triage assessment does not replace that evaluation, and the importance of remaining in the ED until their evaluation is complete.     Hayes Lipps, [place] 10/21/23 2047

## 2023-10-21 NOTE — ED Provider Notes (Signed)
 [place] EMERGENCY DEPARTMENT AT [facility] Provider Note   CSN: [id number] Arrival date & time: 10/21/23  1928     History  Chief Complaint  Patient presents with   Chest Pain   Dizziness    Terry Moss. is a 68 y.o. male.  Seen with a history of heart failure, hypertension and CKD presents the ED for chest pain.  Patient first experienced substernal chest pain a few days ago which resolved spontaneously.  Chest pain returned again tonight along with some lightheadedness.  It did improve again after resting but returned again while standing in his home earlier tonight.  He did have an episode of syncope versus near syncope with reported head injury striking the back of his head.  No headaches or neck pain at this time.  On my initial assessment he has no active chest pain, shortness of breath, nausea, vomiting or abdominal pain.  Chest Pain Associated symptoms: dizziness   Dizziness Associated symptoms: chest pain        Home Medications Prior to Admission medications   Medication Sig Start Date End Date Taking? Authorizing Provider  amLODipine  (NORVASC ) 10 MG tablet Take 10 mg by mouth at bedtime. 01/22/21  Yes [provider]  Cholecalciferol (VITAMIN D3) 50 MCG (2000 UT) TABS Take 2,000 Units by mouth daily after breakfast.   Yes [provider]  colchicine  0.6 MG tablet Take 0.6 mg by mouth 2 (two) times daily as needed (for gout flares).   Yes [provider]  hydrALAZINE  (APRESOLINE ) 25 MG tablet Take 75 mg by mouth 3 (three) times daily. 10/02/19  Yes [provider]  hydrochlorothiazide  (HYDRODIURIL ) 25 MG tablet Take 25 mg by mouth in the morning.   Yes [provider]  losartan  (COZAAR ) 100 MG tablet Take 100 mg by mouth daily.   Yes [provider]  predniSONE (DELTASONE) 50 MG tablet Take 50 mg by mouth daily with breakfast. 10/16/23  Yes [provider]  UNKNOWN TO PATIENT Place 1 drop  into both eyes See admin instructions. Unnamed OTC eye drops (name possibly starts with "Med") for redness/dryness/itching : Instill 1 drop into both eyes up to three times a day as needed for dryness or irritation   Yes [provider]      Allergies    Cefaclor and Other    Review of Systems   Review of Systems  Cardiovascular:  Positive for chest pain.  Neurological:  Positive for dizziness.    Physical Exam Updated Vital Signs BP 133/74   Pulse (!) 45   Temp 98.4 F (36.9 C) (Oral)   Resp 19   Ht 6' (1.829 m)   Wt 78 kg   SpO2 99%   BMI 23.32 kg/m  Physical Exam Vitals and nursing note reviewed.  HENT:     Head: Normocephalic and atraumatic.  Eyes:     Pupils: Pupils are equal, round, and reactive to light.  Cardiovascular:     Rate and Rhythm: Regular rhythm. Bradycardia present.  Pulmonary:     Effort: Pulmonary effort is normal.     Breath sounds: Normal breath sounds.  Abdominal:     Palpations: Abdomen is soft.     Tenderness: There is no abdominal tenderness.  Skin:    General: Skin is warm and dry.  Neurological:     Mental Status: He is alert.  Psychiatric:        Mood and Affect: Mood normal.  ED Results / Procedures / Treatments   Labs (all labs ordered are listed, but only abnormal results are displayed) Labs Reviewed  BASIC METABOLIC PANEL WITH GFR - Abnormal; Notable for the following components:      Result Value   BUN 53 (*)    Creatinine, Ser 2.53 (*)    GFR, Estimated 27 (*)    All other components within normal limits  CBC - Abnormal; Notable for the following components:   RBC 3.60 (*)    Hemoglobin 10.0 (*)    HCT 32.0 (*)    All other components within normal limits  TROPONIN I (HIGH SENSITIVITY) - Abnormal; Notable for the following components:   Troponin I (High Sensitivity) 130 (*)    All other components within normal limits  TROPONIN I (HIGH SENSITIVITY) - Abnormal; Notable for the following components:    Troponin I (High Sensitivity) 190 (*)    All other components within normal limits  MAGNESIUM  HEPARIN  LEVEL (UNFRACTIONATED)  CBC  TROPONIN I (HIGH SENSITIVITY)    EKG EKG Interpretation Date/Time:  Tuesday October 22 2023 00:23:13 EDT Ventricular Rate:  38 PR Interval:  118 QRS Duration:  130 QT Interval:  470 QTC Calculation: 374 R Axis:   -41  Text Interpretation: Sinus rhythm Supraventricular bigeminy Borderline short PR interval Left bundle branch block Confirmed by Rafael Bun [phone]) on 10/22/2023 12:24:11 AM  Radiology DG Chest 2 View Result Date: 10/21/2023 CLINICAL DATA:  Chest pain. EXAM: CHEST - 2 VIEW COMPARISON:  Nov 10, 2020 FINDINGS: The heart size and mediastinal contours are within normal limits. There is stable right apical pleural fluid versus pleural thickening. No acute infiltrate or pneumothorax is identified. Stable postsurgical changes are seen along the lateral aspect of the mid right hemithorax. No acute osseous abnormalities are identified. IMPRESSION: Stable chronic and postoperative changes without evidence of acute cardiopulmonary disease. Electronically Signed   By: Virgle Grime M.D.   On: 10/21/2023 23:52    Procedures Procedures    Medications Ordered in ED Medications  heparin  bolus via infusion 4,000 Units (has no administration in time range)    Followed by  heparin  ADULT infusion 100 units/mL (25000 units/250mL) (has no administration in time range)  amLODipine  (NORVASC ) tablet 10 mg (has no administration in time range)  losartan  (COZAAR ) tablet 100 mg (has no administration in time range)  acetaminophen  (TYLENOL ) tablet 650 mg (has no administration in time range)    Or  acetaminophen  (TYLENOL ) suppository 650 mg (has no administration in time range)  oxyCODONE  (Oxy IR/ROXICODONE ) immediate release tablet 5 mg (has no administration in time range)  ondansetron  (ZOFRAN ) tablet 4 mg (has no administration in time range)    Or   ondansetron  (ZOFRAN ) injection 4 mg (has no administration in time range)  aspirin  EC tablet 81 mg (has no administration in time range)  nitroGLYCERIN  (NITROSTAT ) SL tablet 0.4 mg (has no administration in time range)  carvedilol  (COREG ) tablet 6.25 mg (has no administration in time range)  atorvastatin  (LIPITOR) tablet 40 mg (has no administration in time range)  aspirin  tablet 325 mg (325 mg Oral Given 10/21/23 2158)  sodium chloride  0.9 % bolus 1,000 mL (1,000 mLs Intravenous New Bag/Given 10/21/23 2240)    ED Course/ Medical Decision Making/ A&P Clinical Course as of 10/22/23 0024  Mon Oct 21, 2023  2235 Discussed EKG, troponin and patient's presentation with Dr. Dozier Genre (cardiology) who agrees with plan to hold off on heparin  to treat for NSTEMI at this  time and awaits delta troponin.  Agrees with plan for admission on telemetry.  Cardiology will evaluate tomorrow morning and plan for likely cardiac MRI during admission [MP]  2338 Delta troponin elevated from previous up to 190 from 130.  Will treat for NSTEMI and admit to medicine on telemetry. No active chest pain at this time  [MP]    Clinical Course User Index [MP] Sallyanne Creamer, DO                                 Medical Decision Making 68 year old male with history as above presenting for chest pain and episode of near syncope at home.  No active chest pain on my assessment.  He is bradycardic which she has a history of with a rate typically in the 40s to 50s.  Normotensive.  Differential diagnosis includes ACS, dysrhythmia, pneumonia, electrolyte imbalance in the setting of CKD.  Will obtain laboratory workup including high sensitive troponin, metabolic panel CBC along with EKG and chest x-ray  Amount and/or Complexity of Data Reviewed Labs: ordered. Radiology: ordered.  Risk OTC drugs. Decision regarding hospitalization.           Final Clinical Impression(s) / ED Diagnoses Final diagnoses:  NSTEMI (non-ST  elevated myocardial infarction) [name])    Rx / DC Orders ED Discharge Orders     None         Sallyanne Creamer, DO 10/22/23 0024

## 2023-10-21 NOTE — ED Provider Notes (Incomplete)
 [place] EMERGENCY DEPARTMENT AT [facility] Provider Note   CSN: [id number] Arrival date & time: 10/21/23  1928     History {Add pertinent medical, surgical, social history, OB history to HPI:1} Chief Complaint  Patient presents with  . Chest Pain  . Dizziness    Terry Moss. is a 68 y.o. male.   Chest Pain Associated symptoms: dizziness   Dizziness Associated symptoms: chest pain        Home Medications Prior to Admission medications   Medication Sig Start Date End Date Taking? Authorizing Provider  amLODipine  (NORVASC ) 10 MG tablet Take 10 mg by mouth at bedtime. 01/22/21   [provider]  Carboxymethylcellul-Glycerin 1-0.25 % SOLN Place 1 drop into both eyes 2 (two) times daily as needed (dry eyes).    [provider]  colchicine  0.6 MG tablet Take 0.6 mg by mouth 2 (two) times daily as needed.    [provider]  hydrALAZINE  (APRESOLINE ) 25 MG tablet Take 75 mg by mouth 3 (three) times daily. 10/02/19   [provider]  hydrochlorothiazide  (MICROZIDE ) 12.5 MG capsule Take 12.5 mg by mouth daily. 02/16/21   [provider]  losartan  (COZAAR ) 50 MG tablet Take 50 mg by mouth daily. 01/22/21   [provider]  Multiple Vitamins-Minerals (EQ MULTIVITAMINS ADULT GUMMY PO) Take by mouth. 2 GUMMIES 2 TIMES DAILY    [provider]  tadalafil  (CIALIS ) 20 MG tablet  07/05/22   [provider]  Vitamin D, Cholecalciferol, 25 MCG (1000 UT) TABS Take 1 tablet by mouth daily. 2000 UT    [provider]      Allergies    Cefaclor and Other    Review of Systems   Review of Systems  Cardiovascular:  Positive for chest pain.  Neurological:  Positive for dizziness.    Physical Exam Updated Vital Signs BP (!) 148/78 (BP Location: Right Arm)   Pulse (!) 55   Temp 98.4 F (36.9 C) (Oral)   Resp 16   Ht 6' (1.829 m)   Wt 78 kg   SpO2 100%   BMI 23.32 kg/m  Physical Exam  ED  Results / Procedures / Treatments   Labs (all labs ordered are listed, but only abnormal results are displayed) Labs Reviewed  BASIC METABOLIC PANEL WITH GFR - Abnormal; Notable for the following components:      Result Value   BUN 53 (*)    Creatinine, Ser 2.53 (*)    GFR, Estimated 27 (*)    All other components within normal limits  CBC - Abnormal; Notable for the following components:   RBC 3.60 (*)    Hemoglobin 10.0 (*)    HCT 32.0 (*)    All other components within normal limits  TROPONIN I (HIGH SENSITIVITY) - Abnormal; Notable for the following components:   Troponin I (High Sensitivity) 130 (*)    All other components within normal limits  MAGNESIUM  TROPONIN I (HIGH SENSITIVITY)    EKG EKG Interpretation Date/Time:  Monday October 21 2023 20:19:51 EDT Ventricular Rate:  65 PR Interval:  127 QRS Duration:  131 QT Interval:  401 QTC Calculation: 343 R Axis:   -48  Text Interpretation: Sinus bradycardia Multiple premature complexes, vent & supraven Consider right atrial enlargement Left bundle branch block Confirmed by Rafael Bun [phone]) on 10/21/2023 9:46:04 PM  Radiology No results found.  Procedures Procedures  {Document cardiac monitor, telemetry assessment procedure when appropriate:1}  Medications Ordered  in ED Medications  sodium chloride  0.9 % bolus 1,000 mL (has no administration in time range)  aspirin  tablet 325 mg (325 mg Oral Given 10/21/23 2158)    ED Course/ Medical Decision Making/ A&P   {   Click here for ABCD2, HEART and other calculatorsREFRESH Note before signing :1}                              Medical Decision Making Amount and/or Complexity of Data Reviewed Labs: ordered. Radiology: ordered.  Risk OTC drugs.   ***  {Document critical care time when appropriate:1} {Document review of labs and clinical decision tools ie heart score, Chads2Vasc2 etc:1}  {Document your independent review of radiology images, and any outside  records:1} {Document your discussion with family members, caretakers, and with consultants:1} {Document social determinants of health affecting pt's care:1} {Document your decision making why or why not admission, treatments were needed:1} Final Clinical Impression(s) / ED Diagnoses Final diagnoses:  None    Rx / DC Orders ED Discharge Orders     None

## 2023-10-21 NOTE — ED Triage Notes (Addendum)
 Patient c/o chest pain and dizziness today. Patient report he got dizzy and fell in the floor. Patient denies LOC. Patient denies SOB. No obvious injury. Patient denies N/V.

## 2023-10-21 NOTE — Progress Notes (Addendum)
 PHARMACY - ANTICOAGULATION CONSULT NOTE  Pharmacy Consult for Heparin  Indication: chest pain/ACS  Allergies  Allergen Reactions   Cefaclor Other (See Comments)    Hallucinations   Other Other (See Comments)    Deconamine was a brand name medication that contains two different drugs, an antihistamine (chlorpheniramine) and a decongestant (pseudoephedrine) = Hallucinations    Patient Measurements: Height: 6' (182.9 cm) Weight: 78 kg (171 lb 15.3 oz) IBW/kg (Calculated) : 77.6 HEPARIN  DW (KG): 78  Vital Signs: Temp: 98.4 F (36.9 C) (04/21 2146) Temp Source: Oral (04/21 2146) BP: 133/74 (04/21 2315) Pulse Rate: 45 (04/21 2315)  Labs: Recent Labs    10/21/23 2045 10/21/23 2246  HGB 10.0*  --   HCT 32.0*  --   PLT 212  --   CREATININE 2.53*  --   TROPONINIHS 130* 190*    Estimated Creatinine Clearance: 30.2 mL/min (A) (by C-G formula based on SCr of 2.53 mg/dL (H)).   Medical History: Past Medical History:  Diagnosis Date   Abnormal kidney function    sees [place] kidney x 2    Anemia    Cancer (HCC) dx dec 2019   prostate   Cardiomegaly    Chronic kidney disease    Dysrhythmia    Hypertension    Pneumonia 1993   Sinus complaint     Medications:  No oral anticoagulation noted PTA  Assessment: 68 yr male with chest pain and dizziness.  Elevated troponin.  IV heparin  for NSTEMI  Goal of Therapy:  Heparin  level 0.3-0.7 units/ml Monitor platelets by anticoagulation protocol: Yes   Plan:  Heparin  4000 unit IV bolus x 1 Heparin  gtt @ 950 units/hr Check heparin  level in 8 hr  Daily heparin  level & CBC  Prentis Langdon, Gabriel John, PharmD 10/21/2023,11:46 PM

## 2023-10-22 ENCOUNTER — Emergency Department (HOSPITAL_COMMUNITY)

## 2023-10-22 ENCOUNTER — Inpatient Hospital Stay (HOSPITAL_COMMUNITY)

## 2023-10-22 DIAGNOSIS — I10 Essential (primary) hypertension: Secondary | ICD-10-CM | POA: Diagnosis not present

## 2023-10-22 DIAGNOSIS — Z823 Family history of stroke: Secondary | ICD-10-CM | POA: Diagnosis not present

## 2023-10-22 DIAGNOSIS — R008 Other abnormalities of heart beat: Secondary | ICD-10-CM | POA: Diagnosis present

## 2023-10-22 DIAGNOSIS — Z79899 Other long term (current) drug therapy: Secondary | ICD-10-CM | POA: Diagnosis not present

## 2023-10-22 DIAGNOSIS — I5021 Acute systolic (congestive) heart failure: Secondary | ICD-10-CM | POA: Diagnosis not present

## 2023-10-22 DIAGNOSIS — I214 Non-ST elevation (NSTEMI) myocardial infarction: Secondary | ICD-10-CM

## 2023-10-22 DIAGNOSIS — E1122 Type 2 diabetes mellitus with diabetic chronic kidney disease: Secondary | ICD-10-CM | POA: Diagnosis present

## 2023-10-22 DIAGNOSIS — I447 Left bundle-branch block, unspecified: Secondary | ICD-10-CM | POA: Diagnosis present

## 2023-10-22 DIAGNOSIS — I471 Supraventricular tachycardia, unspecified: Secondary | ICD-10-CM | POA: Diagnosis not present

## 2023-10-22 DIAGNOSIS — Z8701 Personal history of pneumonia (recurrent): Secondary | ICD-10-CM | POA: Diagnosis not present

## 2023-10-22 DIAGNOSIS — X501XXA Overexertion from prolonged static or awkward postures, initial encounter: Secondary | ICD-10-CM | POA: Diagnosis not present

## 2023-10-22 DIAGNOSIS — Z8249 Family history of ischemic heart disease and other diseases of the circulatory system: Secondary | ICD-10-CM | POA: Diagnosis not present

## 2023-10-22 DIAGNOSIS — I2510 Atherosclerotic heart disease of native coronary artery without angina pectoris: Secondary | ICD-10-CM | POA: Diagnosis present

## 2023-10-22 DIAGNOSIS — E785 Hyperlipidemia, unspecified: Secondary | ICD-10-CM | POA: Diagnosis present

## 2023-10-22 DIAGNOSIS — R55 Syncope and collapse: Secondary | ICD-10-CM

## 2023-10-22 DIAGNOSIS — I4892 Unspecified atrial flutter: Secondary | ICD-10-CM | POA: Diagnosis not present

## 2023-10-22 DIAGNOSIS — W1830XA Fall on same level, unspecified, initial encounter: Secondary | ICD-10-CM | POA: Diagnosis present

## 2023-10-22 DIAGNOSIS — I272 Pulmonary hypertension, unspecified: Secondary | ICD-10-CM | POA: Diagnosis present

## 2023-10-22 DIAGNOSIS — Z881 Allergy status to other antibiotic agents status: Secondary | ICD-10-CM | POA: Diagnosis not present

## 2023-10-22 DIAGNOSIS — N184 Chronic kidney disease, stage 4 (severe): Secondary | ICD-10-CM | POA: Diagnosis present

## 2023-10-22 DIAGNOSIS — Z888 Allergy status to other drugs, medicaments and biological substances status: Secondary | ICD-10-CM | POA: Diagnosis not present

## 2023-10-22 DIAGNOSIS — I130 Hypertensive heart and chronic kidney disease with heart failure and stage 1 through stage 4 chronic kidney disease, or unspecified chronic kidney disease: Secondary | ICD-10-CM | POA: Diagnosis present

## 2023-10-22 DIAGNOSIS — I252 Old myocardial infarction: Secondary | ICD-10-CM | POA: Diagnosis not present

## 2023-10-22 DIAGNOSIS — I5043 Acute on chronic combined systolic (congestive) and diastolic (congestive) heart failure: Secondary | ICD-10-CM | POA: Diagnosis present

## 2023-10-22 DIAGNOSIS — Z7901 Long term (current) use of anticoagulants: Secondary | ICD-10-CM | POA: Diagnosis not present

## 2023-10-22 DIAGNOSIS — R001 Bradycardia, unspecified: Secondary | ICD-10-CM | POA: Diagnosis present

## 2023-10-22 DIAGNOSIS — I429 Cardiomyopathy, unspecified: Secondary | ICD-10-CM | POA: Diagnosis present

## 2023-10-22 DIAGNOSIS — Z8673 Personal history of transient ischemic attack (TIA), and cerebral infarction without residual deficits: Secondary | ICD-10-CM | POA: Diagnosis not present

## 2023-10-22 LAB — CBC
HCT: 30.8 % — ABNORMAL LOW (ref 39.0–52.0)
Hemoglobin: 9.8 g/dL — ABNORMAL LOW (ref 13.0–17.0)
MCH: 28.2 pg (ref 26.0–34.0)
MCHC: 31.8 g/dL (ref 30.0–36.0)
MCV: 88.5 fL (ref 80.0–100.0)
Platelets: 190 10*3/uL (ref 150–400)
RBC: 3.48 MIL/uL — ABNORMAL LOW (ref 4.22–5.81)
RDW: 13.8 % (ref 11.5–15.5)
WBC: 5.5 10*3/uL (ref 4.0–10.5)
nRBC: 0 % (ref 0.0–0.2)

## 2023-10-22 LAB — HEPARIN LEVEL (UNFRACTIONATED)
Heparin Unfractionated: 0.47 [IU]/mL (ref 0.30–0.70)
Heparin Unfractionated: 0.48 [IU]/mL (ref 0.30–0.70)

## 2023-10-22 LAB — ECHOCARDIOGRAM COMPLETE
AR max vel: 2.97 cm2
AV Area VTI: 2.76 cm2
AV Area mean vel: 2.61 cm2
AV Mean grad: 7 mmHg
AV Peak grad: 12.4 mmHg
Ao pk vel: 1.76 m/s
Area-P 1/2: 3.38 cm2
Calc EF: 41.8 %
Height: 72 in
S' Lateral: 4.63 cm
Single Plane A2C EF: 49.9 %
Single Plane A4C EF: 34 %
Weight: 2751.34 [oz_av]

## 2023-10-22 LAB — TROPONIN I (HIGH SENSITIVITY)
Troponin I (High Sensitivity): 252 ng/L (ref <18)
Troponin I (High Sensitivity): 244 ng/L (ref <18)

## 2023-10-22 MED ORDER — ACETAMINOPHEN 325 MG PO TABS: 650.0000 mg | ORAL_TABLET | ORAL | Status: DC | PRN

## 2023-10-22 MED ORDER — OXYCODONE HCL 5 MG PO TABS: 5.0000 mg | ORAL_TABLET | ORAL | Status: DC | PRN

## 2023-10-22 MED ORDER — ASPIRIN 300 MG RE SUPP: 300.0000 mg | RECTAL | Status: DC

## 2023-10-22 MED ORDER — ASPIRIN 81 MG PO TBEC
81.0000 mg | DELAYED_RELEASE_TABLET | Freq: Every day | ORAL | Status: DC
  Administered 2023-10-23 – 2023-10-25 (×3): 81 mg via ORAL
  Filled 2023-10-22 (×4): qty 1

## 2023-10-22 MED ORDER — NITROGLYCERIN 0.4 MG SL SUBL: 0.4000 mg | SUBLINGUAL_TABLET | SUBLINGUAL | Status: DC | PRN

## 2023-10-22 MED ORDER — CARVEDILOL 3.125 MG PO TABS
6.2500 mg | ORAL_TABLET | Freq: Two times a day (BID) | ORAL | Status: DC
  Administered 2023-10-22: 6.25 mg via ORAL
  Filled 2023-10-22: qty 2

## 2023-10-22 MED ORDER — AMLODIPINE BESYLATE 5 MG PO TABS
10.0000 mg | ORAL_TABLET | Freq: Every day | ORAL | Status: DC
  Administered 2023-10-22 (×2): 10 mg via ORAL
  Filled 2023-10-22 (×2): qty 2

## 2023-10-22 MED ORDER — ONDANSETRON HCL 4 MG/2ML IJ SOLN: 4.0000 mg | Freq: Four times a day (QID) | INTRAMUSCULAR | Status: DC | PRN

## 2023-10-22 MED ORDER — ACETAMINOPHEN 650 MG RE SUPP: 650.0000 mg | Freq: Four times a day (QID) | RECTAL | Status: DC | PRN

## 2023-10-22 MED ORDER — ASPIRIN 81 MG PO CHEW: 324.0000 mg | CHEWABLE_TABLET | ORAL | Status: DC

## 2023-10-22 MED ORDER — ATORVASTATIN CALCIUM 40 MG PO TABS
40.0000 mg | ORAL_TABLET | Freq: Every day | ORAL | Status: DC
  Administered 2023-10-22 – 2023-10-25 (×4): 40 mg via ORAL
  Filled 2023-10-22 (×5): qty 1

## 2023-10-22 MED ORDER — PERFLUTREN LIPID MICROSPHERE
1.0000 mL | INTRAVENOUS | Status: AC | PRN
  Administered 2023-10-22: 2 mL via INTRAVENOUS

## 2023-10-22 MED ORDER — LOSARTAN POTASSIUM 50 MG PO TABS
100.0000 mg | ORAL_TABLET | Freq: Every day | ORAL | Status: DC
  Administered 2023-10-22: 100 mg via ORAL
  Filled 2023-10-22 (×2): qty 2

## 2023-10-22 MED ORDER — ONDANSETRON HCL 4 MG PO TABS: 4.0000 mg | ORAL_TABLET | Freq: Four times a day (QID) | ORAL | Status: DC | PRN

## 2023-10-22 MED ORDER — ACETAMINOPHEN 325 MG PO TABS: 650.0000 mg | ORAL_TABLET | Freq: Four times a day (QID) | ORAL | Status: DC | PRN

## 2023-10-22 NOTE — Progress Notes (Signed)
 PROGRESS NOTE  Terry Helper Sr.  DOB: [birth year]  PCP: Roselind Congo, MD ZOX:[id number]  DOA: 10/21/2023  LOS: 0 days  Hospital Day: 2  Brief narrative: Terry Helper Sr. is a 68 y.o. male with PMH significant for systolic CHF (EF 25 to 30% in 8119 which improved to 50 to 55% in 2019), nonsustained V. tach, hypertension, CKD.  4/22, patient presented to the ED for evaluation of chest pain.  2009, EF 40%, stress Myoview . 2016, EF 25 to 30% on echo done for dyspnea on exertion.  He could not get cardiac cath because of CKD.  EF gradually improved. 2019, EF 50 to 55% March 2025, last seen by cardiologist Dr. Alroy Aspen  10/21/23, patient presented to ED with complaint of chest pain off and on for 2 days associated with some lightheadedness, vision changes, he also had an episode of syncope and struck the back of his head while bending over In the ED, patient was afebrile, bradycardic to 40s, blood pressure in 130s, breathing on room air Labs showed WC count of 7, hemoglobin 10, troponin 130, BUN/creatinine 53/2.53 Chest x-ray showed stable chronic findings EKG showed sinus rhythm, bradycardia the rate of 38, no ST-T wave changes Troponin was elevated to 130 > 190> 252 > 244 CTA and CT cervical spine unremarkable  Cardiology was consulted from ED.  Patient was started on heparin  drip Admitted to [facility]   Subjective: Patient was seen and examined this morning.  Lying on bed.  Did not complain of chest pain at the time of my evaluation Repeat labs this morning showed hemoglobin of 9.8  Assessment and plan: NSTEMI Presented with chest pain, shortness of breath Troponin elevated EKG without changes Started on heparin  drip Seen by cardiology. Currently also on aspirin , statin and Coreg . Not a candidate for cardiac cath because of elevated creatinine.  Medical management commended Echocardiogram pending Recent Labs    10/21/23 2045 10/21/23 2246 10/22/23 0052 10/22/23 0254   TROPONINIHS 130* 190* 252* 244*    Syncope Bradycardia Frequent bigeminy and trigeminy In the monitor, patient has bradycardia with frequent bigeminy and trigeminy. May need to be seen by EP.  H/o systolic CHF Hypertension With EF improved from 25-30% to 50-55% PTA meds- amlodipine , hydralazine , hydrochlorothiazide , losartan  Continue amlodipine , losartan .  On hold are hydralazine  and hydrochlorothiazide   ??  Prednisone daily   Mobility: Encourage ambulation  Goals of care   Code Status: Full Code     DVT prophylaxis:  SCDs Start: 10/22/23 0003   Antimicrobials: None Fluid: None Consultants: Cardiology Family Communication: None at bedside  Status: Inpatient Level of care:  Telemetry   Patient is from: Home Needs to continue in-hospital care: Ongoing cardiac workup Anticipated d/c to: Pending clinical course   Diet:  Diet Order             Diet Heart Room service appropriate? Yes; Fluid consistency: Thin  Diet effective now                   Scheduled Meds:  amLODipine   10 mg Oral QHS   [START ON 10/23/2023] aspirin  EC  81 mg Oral Daily   atorvastatin   40 mg Oral Daily   losartan   100 mg Oral Daily    PRN meds: acetaminophen  **OR** acetaminophen , nitroGLYCERIN , ondansetron  **OR** ondansetron  (ZOFRAN ) IV, oxyCODONE    Infusions:   heparin  950 Units/hr (10/22/23 0026)    Antimicrobials: Anti-infectives (From admission, onward)    None  Objective: Vitals:   10/22/23 1154 10/22/23 1307  BP: 139/75 102/60  Pulse: 60 63  Resp:  16  Temp:  98.6 F (37 C)  SpO2:  99%   No intake or output data in the 24 hours ending 10/22/23 1323 Filed Weights   10/21/23 2100  Weight: 78 kg   Weight change:  Body mass index is 23.32 kg/m.   Physical Exam: General exam: Pleasant, elderly African-American male Skin: No rashes, lesions or ulcers. HEENT: Atraumatic, normocephalic, no obvious bleeding Lungs: Clear to auscultation bilaterally,   CVS: Irregular rhythm, bradycardia, no murmur,   GI/Abd: Soft, nontender, nondistended, bowel sound present,   CNS: Alert, awake, oriented x 3 Psychiatry: Mood appropriate,  Extremities: No pedal edema, no calf tenderness,   Data Review: I have personally reviewed the laboratory data and studies available.  F/u labs  Unresulted Labs (From admission, onward)     Start     Ordered   10/22/23 1600  Heparin  level (unfractionated)  Once-Timed,   TIMED        10/22/23 1150   10/22/23 0800  CBC  Daily,   R      10/22/23 0241           Total time spent in review of labs and imaging, patient evaluation, formulation of plan, documentation and communication with family: 55 minutes  Signed, Hoyt Macleod, MD [facility] 10/22/2023

## 2023-10-22 NOTE — Progress Notes (Addendum)
 PHARMACY - ANTICOAGULATION CONSULT NOTE  Pharmacy Consult for Heparin  Indication: chest pain/ACS  Allergies  Allergen Reactions   Cefaclor Other (See Comments)    Hallucinations   Other Other (See Comments)    Deconamine was a brand name medication that contains two different drugs, an antihistamine (chlorpheniramine) and a decongestant (pseudoephedrine) = Hallucinations    Patient Measurements: Height: 6' (182.9 cm) Weight: 78 kg (171 lb 15.3 oz) IBW/kg (Calculated) : 77.6 HEPARIN  DW (KG): 78  Vital Signs: Temp: 98.6 F (37 C) (04/22 0851) Temp Source: Oral (04/22 0851) BP: 122/71 (04/22 0922) Pulse Rate: 30 (04/22 0922)  Labs: Recent Labs    10/21/23 2045 10/21/23 2246 10/22/23 0052 10/22/23 0254 10/22/23 0800  HGB 10.0*  --   --   --  9.8*  HCT 32.0*  --   --   --  30.8*  PLT 212  --   --   --  190  HEPARINUNFRC  --   --   --   --  0.47  CREATININE 2.53*  --   --   --   --   TROPONINIHS 130* 190* 252* 244*  --     Estimated Creatinine Clearance: 30.2 mL/min (A) (by C-G formula based on SCr of 2.53 mg/dL (H)).  Medications:  No oral anticoagulation noted PTA  Assessment: 65 yoM presents with chest pain and dizziness. Elevated troponin. IV heparin  for NSTEMI  Today, 10/22/2023: First heparin  level therapeutic on 950 units/hr Hgb slightly low on admission but remains stable; Plt stable WNL SCr elevated, but no recent baseline for comparison No bleeding or infusion issues per RN  Goal of Therapy:  Heparin  level 0.3-0.7 units/ml Monitor platelets by anticoagulation protocol: Yes   Plan:  Continue IV heparin  infusion at 950 units/hr Check confirmatory heparin  level in 8 hr  Daily heparin  level & CBC  Austine Wiedeman A, PharmD 10/22/2023,11:31 AM

## 2023-10-22 NOTE — Consult Note (Addendum)
 Cardiology Consultation   Patient ID: Terry Helper Sr. MRN: [id number]; DOB: 02/07/1956  Admit date: 10/21/2023 Date of Consult: 10/22/2023  PCP:  Roselind Congo, MD   [facility] Providers Cardiologist:  Ahmad Alert, MD  Electrophysiologist:  Lei Pump, MD  {   Patient Profile:   Terry Rosander. is a 68 y.o. male with a hx of heart failure with improved EF (25 to 30% in 2016 improved to 50 to 55% in 2019), nonsustained ventricular tachycardia, hypertension, CKD, Bigeminy who is being seen 10/22/2023 for the evaluation of NSTEMI and Bradycardia at the request of Dahal Binaya.  History of Present Illness:   Terry Moss is a 68 year old male with prior cardiac history shown below ejection fraction was estimated at 40% on a stress Myoview  in 2009. In 07/2014 he developed dyspnea on exertion, an echo on 08/2014 showed an LVEF of 25 to 30%.  He was originally scheduled for a cardiac cath but this was canceled due to an elevated creatinine from his CKD.  His symptoms improved the next few months and on 10/2014 his LVEF was 40-45 %.  On 12/2017 his LVEF improved to 50 to 55%.  At this time he was on hydralazine  and Imdur .  On 10/2019 he stopped isosorbide  and started Cialis .  He has continued to remain active and was last seen on 08/2023 by Dr. Alroy Aspen.  This time his medications included amlodipine , hydrochlorothiazide , losartan , and cialis .   Terry Moss presented to the emergency department On 10/21/2023 for chest pain. On interview he reported first experiencing the pain 2 days prior but it had resolved spontaneously after relaxing and resting.  The pain returned yesterday evening after work. Has not had any chest pain since arriving in the ER. He was also experiencing some lightheadedness and vision changes.  He had an episode of syncope and struck the back of his head. He was walking in the hall way and bent over to pick up his shoes prior to feeling lightheaded and having  the syncope. He feels like he has been well hydrated recently. Has had several episodes of light headedness in the past.  Heart rate have been bradycardic with a rates in the 30's.  Blood pressure has been slightly elevated. Denies shortness of breath, diaphoresis, nausea, vomiting, palpitations, lower extremity edema, melena, hematourea, hematochezia.   Labs showed Potassium4.4 (04/21 2045) Magnesium  2.4 (04/21 2045) Creatinine, ser  2.53* (04/21 2045) PLT  212 (04/21 2045) HGB  10.0* (04/21 2045) WBC 7.0 (04/21 2045) High-sensitivity troponins 130 >190 > 252 > 244 EKG shows sinus rhythm, bradycardia with a rate of 38, LBBB, Right atrial enlargment, possible short PR interval, Frequent bigeminy. Chest x-ray showed no acute cardiopulmonary changes Head CT was negative.  Past Medical History:  Diagnosis Date   Abnormal kidney function    sees [place] kidney x 2    Anemia    Cancer (HCC) dx dec 2019   prostate   Cardiomegaly    Chronic kidney disease    Dysrhythmia    Hypertension    Pneumonia 1993   Sinus complaint     Past Surgical History:  Procedure Laterality Date   ELBOW SURGERY Right 05/2018   PELVIC LYMPH NODE DISSECTION Bilateral 11/14/2018   Procedure: PELVIC LYMPH NODE DISSECTION;  Surgeon: Andrez Banker, MD;  Location: [facility];  Service: Urology;  Laterality: Bilateral;   pleurisy  1993   Right lung surgery    PROSTATE BIOPSY  ROBOT ASSISTED LAPAROSCOPIC RADICAL PROSTATECTOMY N/A 11/14/2018   Procedure: XI ROBOTIC ASSISTED LAPAROSCOPIC RADICAL PROSTATECTOMY;  Surgeon: Andrez Banker, MD;  Location: [facility];  Service: Urology;  Laterality: N/A;     Home Medications:  Prior to Admission medications   Medication Sig Start Date End Date Taking? Authorizing Provider  amLODipine  (NORVASC ) 10 MG tablet Take 10 mg by mouth at bedtime. 01/22/21  Yes [provider]  Cholecalciferol (VITAMIN D3) 50 MCG (2000 UT) TABS Take 2,000 Units by mouth daily  after breakfast.   Yes [provider]  colchicine  0.6 MG tablet Take 0.6 mg by mouth 2 (two) times daily as needed (for gout flares).   Yes [provider]  hydrALAZINE  (APRESOLINE ) 25 MG tablet Take 75 mg by mouth 3 (three) times daily. 10/02/19  Yes [provider]  hydrochlorothiazide  (HYDRODIURIL ) 25 MG tablet Take 25 mg by mouth in the morning.   Yes [provider]  losartan  (COZAAR ) 100 MG tablet Take 100 mg by mouth daily.   Yes [provider]  predniSONE (DELTASONE) 50 MG tablet Take 50 mg by mouth daily with breakfast. 10/16/23  Yes [provider]  UNKNOWN TO PATIENT Place 1 drop into both eyes See admin instructions. Unnamed OTC eye drops (name possibly starts with "Med") for redness/dryness/itching : Instill 1 drop into both eyes up to three times a day as needed for dryness or irritation   Yes [provider]    Inpatient Medications: Scheduled Meds:  amLODipine   10 mg Oral QHS   [START ON 10/23/2023] aspirin  EC  81 mg Oral Daily   atorvastatin   40 mg Oral Daily   carvedilol   6.25 mg Oral BID WC   losartan   100 mg Oral Daily   Continuous Infusions:  heparin  950 Units/hr (10/22/23 0026)   PRN Meds: acetaminophen  **OR** acetaminophen , nitroGLYCERIN , ondansetron  **OR** ondansetron  (ZOFRAN ) IV, oxyCODONE   Allergies:    Allergies  Allergen Reactions   Cefaclor Other (See Comments)    Hallucinations   Other Other (See Comments)    Deconamine was a brand name medication that contains two different drugs, an antihistamine (chlorpheniramine) and a decongestant (pseudoephedrine) = Hallucinations    Social History:   Social History   Socioeconomic History   Marital status: Divorced    Spouse name: Not on file   Number of children: 2   Years of education: Not on file   Highest education level: Not on file  Occupational History   Occupation: filter specialist  Tobacco Use   Smoking status: Never   Smokeless  tobacco: Never  Vaping Use   Vaping status: Never Used  Substance and Sexual Activity   Alcohol use: Yes    Comment: occ wine   Drug use: No   Sexual activity: Not on file  Other Topics Concern   Not on file  Social History Narrative   Not on file   Social Drivers of Health   Financial Resource Strain: Not on file  Food Insecurity: Not on file  Transportation Needs: Not on file  Physical Activity: Not on file  Stress: Not on file  Social Connections: Not on file  Intimate Partner Violence: Not on file    Family History:    Family History  Problem Relation Age of Onset   Heart disease Father        deceased   Stroke Father    Hypertension Mother        deceased   Stroke Mother  Heart disease Brother    Heart disease Sister        x 2 sister   Heart attack Brother    Stroke Brother      ROS:  Please see the history of present illness.   All other ROS reviewed and negative.     Physical Exam/Data:   Vitals:   10/22/23 0400 10/22/23 0504 10/22/23 0600 10/22/23 0851  BP: 136/73  (!) 145/69   Pulse: 79  (!) 37   Resp: 16  17   Temp:  97.7 F (36.5 C)  98.6 F (37 C)  TempSrc:  Oral  Oral  SpO2: 100%  99%   Weight:      Height:       No intake or output data in the 24 hours ending 10/22/23 0915    10/21/2023    9:00 PM 09/27/2023    3:27 PM 03/08/2022    4:11 PM  Last 3 Weights  Weight (lbs) 171 lb 15.3 oz 172 lb 3.2 oz 178 lb 3.2 oz  Weight (kg) 78 kg 78.109 kg 80.831 kg     Body mass index is 23.32 kg/m.  General:  Well nourished, well developed, in no acute distress HEENT: normal Neck: no JVD Vascular: No carotid bruits; Distal pulses 2+ bilaterally Cardiac:  Irregular heart rhythm Lungs:  clear to auscultation bilaterally, no wheezing, rhonchi or rales  Abd: soft, nontender, no hepatomegaly  Ext: no edema Musculoskeletal:  No deformities Skin: warm and dry  Neuro:   no focal abnormalities noted Psych:  Normal affect   EKG:  The EKG was  personally reviewed and demonstrates:  EKG shows sinus rhythm, bradycardia with a rate of 38, LBBB, Right atrial enlargment, possible short PR interval, Frequent bigeminy. Telemetry:  Telemetry was personally reviewed and demonstrates:  frequent bigeminy, and trigeminy, 3 beats of VT, episode of 9 beats of SVT. Heart rates are bradycardic in the 30's to 50's  Relevant CV Studies: Echo Pending  Laboratory Data:  High Sensitivity Troponin:   Recent Labs  Lab 10/21/23 2045 10/21/23 2246 10/22/23 0052 10/22/23 0254  TROPONINIHS 130* 190* 252* 244*     Chemistry Recent Labs  Lab 10/21/23 2045  NA 138  K 4.4  CL 105  CO2 28  GLUCOSE 92  BUN 53*  CREATININE 2.53*  CALCIUM  9.2  MG 2.4  GFRNONAA 27*  ANIONGAP 5    No results for input(s): "PROT", "ALBUMIN", "AST", "ALT", "ALKPHOS", "BILITOT" in the last 168 hours. Lipids No results for input(s): "CHOL", "TRIG", "HDL", "LABVLDL", "LDLCALC", "CHOLHDL" in the last 168 hours.  Hematology Recent Labs  Lab 10/21/23 2045  WBC 7.0  RBC 3.60*  HGB 10.0*  HCT 32.0*  MCV 88.9  MCH 27.8  MCHC 31.3  RDW 13.7  PLT 212   Thyroid No results for input(s): "TSH", "FREET4" in the last 168 hours.  BNPNo results for input(s): "BNP", "PROBNP" in the last 168 hours.  DDimer No results for input(s): "DDIMER" in the last 168 hours.   Radiology/Studies:  CT Head Wo Contrast Result Date: 10/22/2023 CLINICAL DATA:  Dizziness and fall EXAM: CT HEAD WITHOUT CONTRAST CT CERVICAL SPINE WITHOUT CONTRAST TECHNIQUE: Multidetector CT imaging of the head and cervical spine was performed following the standard protocol without intravenous contrast. Multiplanar CT image reconstructions of the cervical spine were also generated. RADIATION DOSE REDUCTION: This exam was performed according to the departmental dose-optimization program which includes automated exposure control, adjustment of the mA and/or kV according  to patient size and/or use of iterative  reconstruction technique. COMPARISON:  None Available. FINDINGS: CT HEAD FINDINGS Brain: Old right basal ganglia small vessel infarct. No mass, hemorrhage or extra-axial collection. Vascular: No hyperdense vessel or unexpected vascular calcification. Skull: The visualized skull base, calvarium and extracranial soft tissues are normal. Sinuses/Orbits: No fluid levels or advanced mucosal thickening of the visualized paranasal sinuses. No mastoid or middle ear effusion. Normal orbits. Other: None. CT CERVICAL SPINE FINDINGS Alignment: No static subluxation. Facets are aligned. Occipital condyles are normally positioned. Skull base and vertebrae: No acute fracture. Soft tissues and spinal canal: No prevertebral fluid or swelling. No visible canal hematoma. Disc levels: No advanced spinal canal or neural foraminal stenosis. Upper chest: Right apical opacities. Other: Normal visualized paraspinal cervical soft tissues. IMPRESSION: 1. No acute intracranial abnormality. 2. No acute fracture or static subluxation of the cervical spine. 3. Right apical pulmonary opacities. Electronically Signed   By: Juanetta Nordmann M.D.   On: 10/22/2023 03:53   CT Cervical Spine Wo Contrast Result Date: 10/22/2023 CLINICAL DATA:  Dizziness and fall EXAM: CT HEAD WITHOUT CONTRAST CT CERVICAL SPINE WITHOUT CONTRAST TECHNIQUE: Multidetector CT imaging of the head and cervical spine was performed following the standard protocol without intravenous contrast. Multiplanar CT image reconstructions of the cervical spine were also generated. RADIATION DOSE REDUCTION: This exam was performed according to the departmental dose-optimization program which includes automated exposure control, adjustment of the mA and/or kV according to patient size and/or use of iterative reconstruction technique. COMPARISON:  None Available. FINDINGS: CT HEAD FINDINGS Brain: Old right basal ganglia small vessel infarct. No mass, hemorrhage or extra-axial collection.  Vascular: No hyperdense vessel or unexpected vascular calcification. Skull: The visualized skull base, calvarium and extracranial soft tissues are normal. Sinuses/Orbits: No fluid levels or advanced mucosal thickening of the visualized paranasal sinuses. No mastoid or middle ear effusion. Normal orbits. Other: None. CT CERVICAL SPINE FINDINGS Alignment: No static subluxation. Facets are aligned. Occipital condyles are normally positioned. Skull base and vertebrae: No acute fracture. Soft tissues and spinal canal: No prevertebral fluid or swelling. No visible canal hematoma. Disc levels: No advanced spinal canal or neural foraminal stenosis. Upper chest: Right apical opacities. Other: Normal visualized paraspinal cervical soft tissues. IMPRESSION: 1. No acute intracranial abnormality. 2. No acute fracture or static subluxation of the cervical spine. 3. Right apical pulmonary opacities. Electronically Signed   By: Juanetta Nordmann M.D.   On: 10/22/2023 03:53   DG Chest 2 View Result Date: 10/21/2023 CLINICAL DATA:  Chest pain. EXAM: CHEST - 2 VIEW COMPARISON:  Nov 10, 2020 FINDINGS: The heart size and mediastinal contours are within normal limits. There is stable right apical pleural fluid versus pleural thickening. No acute infiltrate or pneumothorax is identified. Stable postsurgical changes are seen along the lateral aspect of the mid right hemithorax. No acute osseous abnormalities are identified. IMPRESSION: Stable chronic and postoperative changes without evidence of acute cardiopulmonary disease. Electronically Signed   By: Virgle Grime M.D.   On: 10/21/2023 23:52     Assessment and Plan:   Terry Verdun. is a 68 y.o. male with a hx of heart failure with improved EF (25 to 30% in 2016 improved to 50 to 55% in 2019), nonsustained ventricular tachycardia, hypertension, CKD who is being seen 10/22/2023 for the evaluation of NSTEMI and Bradycardia at the request of Dahal Binaya.  NSTEMI Admit to  the ER following substernal chest pain that started after work in the evening.  Denies shortness of breath, diaphoresis, vomiting, nausea.  Has not had any chest pain since arriving to the ER EKG showed no acute ischemic changes. High-sensitivity troponins 130 >190 > 252 > 244. Creatinine, ser  2.53* (04/21 2045) this appears to be about baseline. -- It has been a long time since he has taken Cialis  so may take nitroglycerin  if needed -- Continue aspirin , atorvastatin , IV heparin .  D/c coreg  given bradycardia -- Patient is a poor candidate for left heart cath due to his CKD. Plan to continue medical management.  -- Echo pending  Near Syncope Bradycardia Frequent Bigeminy and trigeminy Felt lightheaded had vision changes and fell to the floor and struck his head. Potassium4.4 (04/21 2045), Magnesium  2.4 (04/21 2045) EKG shows sinus rhythm, bradycardia with a rate of 38, LBBB, Right atrial enlargment, possible short PR interval, Frequent bigeminy.  Patient has had bigeminy on previous EKGs. -- Order orthostatic vitals as the syncope happened after bending over.  Heart failure with improved EF echo on 08/2014 showed an LVEF of 25 to 30%, improved to 50 to 55% in 2019. Denies having any symptoms concerning for acute heart failure. -- See NSTEMI above -- Echo Pending  Hypertension PTA on amlodipine , hydrochlorothiazide , losartan . -- Continue amlodipine  10 mg daily -- Consider stopping Losartan  due to reduced renal function. -- Stop hydrocholothiazide. -- See NSTEMI above  Stage IV CKD Management per primary  Other conditions managed per primary  Risk Assessment/Risk Scores:     TIMI Risk Score for Unstable Angina or Non-ST Elevation MI:   The patient's TIMI risk score is  , which indicates a  % risk of all cause mortality, new or recurrent myocardial infarction or need for urgent revascularization in the next 14 days.          For questions or updates, please contact [facility] Please consult [email or web address] for contact info under    Signed, Melita Springer, PA-C  10/22/2023 9:15 AM   Patient seen and examined.  Agree with below documentation.  Terry Moss is a 68 year old male with a history of heart failure with improved ejection fraction, CKD, hypertension who were consulted for evaluation of NSTEMI at the request of Dr. Aminta Baldy.  He was diagnosed with systolic heart failure in 2016, echocardiogram at that time showed EF 25 to 30%.  Cardiac catheterization was planned but deferred due to his CKD.  Echocardiogram in 2019 showed EF improved to 50 to 55%.  He presented to ED yesterday with chest pain.  Describes burning sensation in center of chest lasting about 2 hours. Also reported near syncopal episode.  States that stood up and was walking down hall and felt lightheaded and fell to ground, but di not lose consciousness.  On presentation to the ED, initial vital signs notable for BP 137/68, pulse 60.  Review of telemetry shows heart rate down the 40s to 50s overnight, briefly 30s.  Labs notable for troponin 130 > 190 > 252 > 244, creatinine 2.5, hemoglobin 10.0.  Chest x-ray without acute cardiopulmonary disease.  EKG shows sinus bradycardia with frequent PACs, rate 38. On exam, patient is alert and oriented, bradycardic, irregular, no murmurs, lungs CTAB, no LE edema or JVD.  He presents with chest pain and mild troponin elevation.  Not a good candidate for invasive evaluation given his CKD.  Recommend medical management.  He is currently chest pain-free.  Continue aspirin , heparin  drip.  Also reported near syncopal episode.  Heart rate has been low.  He  was started on carvedilol  on admission, will discontinue.  Will continue to monitor on telemetry.  Will check echocardiogram.  Wendie Hamburg, MD

## 2023-10-22 NOTE — Progress Notes (Signed)
 PHARMACY - ANTICOAGULATION CONSULT NOTE  Pharmacy Consult for Heparin  Indication: chest pain/ACS  Allergies  Allergen Reactions   Cefaclor Other (See Comments)    Hallucinations   Other Other (See Comments)    Deconamine was a brand name medication that contains two different drugs, an antihistamine (chlorpheniramine) and a decongestant (pseudoephedrine) = Hallucinations    Patient Measurements: Height: 6' (182.9 cm) Weight: 78 kg (171 lb 15.3 oz) IBW/kg (Calculated) : 77.6 HEPARIN  DW (KG): 78  Vital Signs: Temp: 97.8 F (36.6 C) (04/22 1622) Temp Source: Oral (04/22 1622) BP: 145/88 (04/22 1622) Pulse Rate: 44 (04/22 1622)  Labs: Recent Labs    10/21/23 2045 10/21/23 2246 10/22/23 0052 10/22/23 0254 10/22/23 0800 10/22/23 1727  HGB 10.0*  --   --   --  9.8*  --   HCT 32.0*  --   --   --  30.8*  --   PLT 212  --   --   --  190  --   HEPARINUNFRC  --   --   --   --  0.47 0.48  CREATININE 2.53*  --   --   --   --   --   TROPONINIHS 130* 190* 252* 244*  --   --     Estimated Creatinine Clearance: 30.2 mL/min (A) (by C-G formula based on SCr of 2.53 mg/dL (H)).  Medications:  No oral anticoagulation noted PTA  Assessment: 28 yoM presents with chest pain and dizziness. Elevated troponin. IV heparin  for NSTEMI--cardiology planning medical management for now given patient a poor candidate for LHC due to CKD.   Today, 10/22/2023: Heparin  level remains therapeutic (0.48) on 950 units/hr Hgb slightly low on admission but remains stable; Plt stable WNL SCr elevated, but no recent baseline for comparison No bleeding or infusion issues documented  Goal of Therapy:  Heparin  level 0.3-0.7 units/ml Monitor platelets by anticoagulation protocol: Yes   Plan:  Continue IV heparin  infusion at 950 units/hr Daily heparin  level & CBC Continue to monitor for s/sx of bleeding   Roselee Cong, PharmD Clinical Pharmacist  4/22/20256:37 PM

## 2023-10-22 NOTE — H&P (Signed)
 History and Physical    Terry Moss ZOX:[id number] DOB: 03-16-1956 DOA: 10/21/2023  PCP: Roselind Congo, MD   Chief Complaint: cp  HPI: Terry Helper Sr. is a 68 y.o. male with medical history significant of hypertension, CKD who presents emergency department due to chest pain.  Patient was having chest pain dizziness 2 days ago that lasted about hour.  He had recurrence of the chest pain today so he presented to the ER.  On arrival he was afebrile hemodynamically stable.  Labs were obtained which showed creatinine 2.5 baseline around 2, WBC 7.0, hemoglobin 10, troponin 130, second troponin 190, magnesium 2.5.  Chest x-ray was obtained which showed stable postoperative changes.  Cardiology was consulted regarding concern for NSTEMI and recommended to start heparin .  Patient was admitted further workup.   Review of Systems: Review of Systems  Constitutional:  Negative for chills and fever.  HENT: Negative.    Eyes: Negative.   Respiratory: Negative.    Cardiovascular:  Positive for chest pain.  Gastrointestinal: Negative.   Genitourinary: Negative.   Musculoskeletal: Negative.   Skin: Negative.   Neurological: Negative.   Endo/Heme/Allergies: Negative.   Psychiatric/Behavioral: Negative.       As per HPI otherwise 10 point review of systems negative.   Allergies  Allergen Reactions   Cefaclor Other (See Comments)    Hallucinations   Other Other (See Comments)    Deconamine was a brand name medication that contains two different drugs, an antihistamine (chlorpheniramine) and a decongestant (pseudoephedrine) = Hallucinations    Past Medical History:  Diagnosis Date   Abnormal kidney function    sees [place] kidney x 2    Anemia    Cancer (HCC) dx dec 2019   prostate   Cardiomegaly    Chronic kidney disease    Dysrhythmia    Hypertension    Pneumonia 1993   Sinus complaint     Past Surgical History:  Procedure Laterality Date   ELBOW SURGERY Right 05/2018    PELVIC LYMPH NODE DISSECTION Bilateral 11/14/2018   Procedure: PELVIC LYMPH NODE DISSECTION;  Surgeon: Andrez Banker, MD;  Location: [facility];  Service: Urology;  Laterality: Bilateral;   pleurisy  1993   Right lung surgery    PROSTATE BIOPSY     ROBOT ASSISTED LAPAROSCOPIC RADICAL PROSTATECTOMY N/A 11/14/2018   Procedure: XI ROBOTIC ASSISTED LAPAROSCOPIC RADICAL PROSTATECTOMY;  Surgeon: Andrez Banker, MD;  Location: [facility];  Service: Urology;  Laterality: N/A;     reports that he has never smoked. He has never used smokeless tobacco. He reports current alcohol use. He reports that he does not use drugs.  Family History  Problem Relation Age of Onset   Heart disease Father        deceased   Stroke Father    Hypertension Mother        deceased   Stroke Mother    Heart disease Brother    Heart disease Sister        x 2 sister   Heart attack Brother    Stroke Brother     Prior to Admission medications   Medication Sig Start Date End Date Taking? Authorizing Provider  amLODipine  (NORVASC ) 10 MG tablet Take 10 mg by mouth at bedtime. 01/22/21  Yes [provider]  Cholecalciferol  (VITAMIN D3) 50 MCG (2000 UT) TABS Take 2,000 Units by mouth daily after breakfast.   Yes [provider]  colchicine  0.6 MG tablet Take 0.6  mg by mouth 2 (two) times daily as needed (for gout flares).   Yes [provider]  hydrALAZINE  (APRESOLINE ) 25 MG tablet Take 75 mg by mouth 3 (three) times daily. 10/02/19  Yes [provider]  hydrochlorothiazide  (HYDRODIURIL ) 25 MG tablet Take 25 mg by mouth in the morning.   Yes [provider]  losartan  (COZAAR ) 100 MG tablet Take 100 mg by mouth daily.   Yes [provider]  predniSONE (DELTASONE) 50 MG tablet Take 50 mg by mouth daily with breakfast. 10/16/23  Yes [provider]  UNKNOWN TO PATIENT Place 1 drop into both eyes See admin instructions. Unnamed OTC eye drops (name possibly starts  with "Med") for redness/dryness/itching : Instill 1 drop into both eyes up to three times a day as needed for dryness or irritation   Yes [provider]    Physical Exam: Vitals:   10/21/23 2100 10/21/23 2146 10/21/23 2153 10/21/23 2315  BP:  (!) 154/72 (!) 148/78 133/74  Pulse:  (!) 42 (!) 55 (!) 45  Resp:  16 16 19   Temp:  98.4 F (36.9 C)    TempSrc:  Oral    SpO2:  100% 100% 99%  Weight: 78 kg     Height: 6' (1.829 m)      Physical Exam Constitutional:      Appearance: He is normal weight.  Cardiovascular:     Rate and Rhythm: Normal rate and regular rhythm.     Heart sounds: Normal heart sounds.  Pulmonary:     Effort: Pulmonary effort is normal.     Breath sounds: Normal breath sounds.  Abdominal:     Palpations: Abdomen is soft.  Musculoskeletal:        General: Normal range of motion.     Cervical back: Normal range of motion.  Skin:    General: Skin is warm.  Neurological:     General: No focal deficit present.     Mental Status: He is alert.      Labs on Admission: I have personally reviewed the patients's labs and imaging studies.  Assessment/Plan Principal Problem:   NSTEMI (non-ST elevated myocardial infarction) (HCC)   # NSTEMI - Patient presented chest pain found to have elevated troponins - Troponin 130, 190 Cardiology consulted  Plan: Loaded with aspirin  Started beta-blocker Continue losartan  Start heparin  drip As needed nitroglycerin  EKG as needed Echocardiogram N.p.o. for cath Start statin  # Hypertension-continue amlodipine , hold hydralazine , hydrochlorothiazide   # Chronic heart failure reduced ejection fraction-patient has history of EF 25 to 30% in 2016 which normalized in 2019  Admission status: Inpatient Telemetry  Certification: The appropriate patient status for this patient is INPATIENT. Inpatient status is judged to be reasonable and necessary in order to provide the required intensity of service to ensure the  patient's safety. The patient's presenting symptoms, physical exam findings, and initial radiographic and laboratory data in the context of their chronic comorbidities is felt to place them at high risk for further clinical deterioration. Furthermore, it is not anticipated that the patient will be medically stable for discharge from the hospital within 2 midnights of admission.   * I certify that at the point of admission it is my clinical judgment that the patient will require inpatient hospital care spanning beyond 2 midnights from the point of admission due to high intensity of service, high risk for further deterioration and high frequency of surveillance required.Myrl Askew MD [facility] If 7PM-7AM, please  contact night-coverage [email or web address]  10/22/2023, 12:06 AM

## 2023-10-22 NOTE — Progress Notes (Signed)
  Echocardiogram 2D Echocardiogram has been performed.  Terry Moss 10/22/2023, 3:55 PM

## 2023-10-23 DIAGNOSIS — I214 Non-ST elevation (NSTEMI) myocardial infarction: Secondary | ICD-10-CM | POA: Diagnosis not present

## 2023-10-23 DIAGNOSIS — I5021 Acute systolic (congestive) heart failure: Secondary | ICD-10-CM | POA: Diagnosis not present

## 2023-10-23 DIAGNOSIS — I4892 Unspecified atrial flutter: Secondary | ICD-10-CM

## 2023-10-23 LAB — CBC
HCT: 33.5 % — ABNORMAL LOW (ref 39.0–52.0)
Hemoglobin: 10.4 g/dL — ABNORMAL LOW (ref 13.0–17.0)
MCH: 27.9 pg (ref 26.0–34.0)
MCHC: 31 g/dL (ref 30.0–36.0)
MCV: 89.8 fL (ref 80.0–100.0)
Platelets: 179 10*3/uL (ref 150–400)
RBC: 3.73 MIL/uL — ABNORMAL LOW (ref 4.22–5.81)
RDW: 13.3 % (ref 11.5–15.5)
WBC: 4.2 10*3/uL (ref 4.0–10.5)
nRBC: 0 % (ref 0.0–0.2)

## 2023-10-23 LAB — BASIC METABOLIC PANEL WITH GFR
Anion gap: 9 (ref 5–15)
BUN: 42 mg/dL — ABNORMAL HIGH (ref 8–23)
CO2: 25 mmol/L (ref 22–32)
Calcium: 8.9 mg/dL (ref 8.9–10.3)
Chloride: 103 mmol/L (ref 98–111)
Creatinine, Ser: 2.17 mg/dL — ABNORMAL HIGH (ref 0.61–1.24)
GFR, Estimated: 32 mL/min — ABNORMAL LOW (ref >60)
Glucose, Bld: 95 mg/dL (ref 70–99)
Potassium: 4.6 mmol/L (ref 3.5–5.1)
Sodium: 137 mmol/L (ref 135–145)

## 2023-10-23 LAB — HEPARIN LEVEL (UNFRACTIONATED): Heparin Unfractionated: 0.51 [IU]/mL (ref 0.30–0.70)

## 2023-10-23 MED ORDER — AMIODARONE HCL 200 MG PO TABS
200.0000 mg | ORAL_TABLET | Freq: Two times a day (BID) | ORAL | Status: DC
  Administered 2023-10-23 – 2023-10-25 (×5): 200 mg via ORAL
  Filled 2023-10-23 (×5): qty 1

## 2023-10-23 MED ORDER — AMIODARONE HCL IN DEXTROSE 360-4.14 MG/200ML-% IV SOLN
60.0000 mg/h | INTRAVENOUS | Status: DC
  Filled 2023-10-23: qty 200

## 2023-10-23 MED ORDER — AMIODARONE HCL IN DEXTROSE 360-4.14 MG/200ML-% IV SOLN: 30.0000 mg/h | INTRAVENOUS | Status: DC

## 2023-10-23 MED ORDER — ISOSORBIDE MONONITRATE ER 30 MG PO TB24
30.0000 mg | ORAL_TABLET | Freq: Every day | ORAL | Status: DC
  Administered 2023-10-23: 30 mg via ORAL
  Filled 2023-10-23 (×2): qty 1

## 2023-10-23 MED ORDER — SODIUM CHLORIDE 0.9 % IV SOLN: INTRAVENOUS | Status: DC

## 2023-10-23 NOTE — Progress Notes (Addendum)
 Patient Name: Terry LACHNEY Sr. Date of Encounter: 10/23/2023 [facility] Cardiologist: Ahmad Alert, MD   Interval Summary  .    Denies any recent chest pain, shortness of breath, diaphoresis, lower extremity edema, orthopnea. Was sitting up by the bed during the interview.  Vital Signs .    Vitals:   10/22/23 1307 10/22/23 1622 10/22/23 2348 10/23/23 0436  BP: 102/60 (!) 145/88 134/81 135/78  Pulse: 63 (!) 44 (!) 51 97  Resp: 16 16 16 17   Temp: 98.6 F (37 C) 97.8 F (36.6 C) 97.9 F (36.6 C) 97.6 F (36.4 C)  TempSrc: Oral Oral Oral Oral  SpO2: 99% 98% 99% 99%  Weight:      Height:        Intake/Output Summary (Last 24 hours) at 10/23/2023 0852 Last data filed at 10/23/2023 0535 Gross per 24 hour  Intake 1031 ml  Output --  Net 1031 ml      10/21/2023    9:00 PM 09/27/2023    3:27 PM 03/08/2022    4:11 PM  Last 3 Weights  Weight (lbs) 171 lb 15.3 oz 172 lb 3.2 oz 178 lb 3.2 oz  Weight (kg) 78 kg 78.109 kg 80.831 kg      Telemetry/ECG    Telemetry showed :  frequent bigeminy, and trigeminy, heart rates remain bradycardic in the 40's-60's - Personally Reviewed  Physical Exam .   GEN: No acute distress.   Neck: No JVD Cardiac: Irregular rhythm, no murmurs, rubs, or gallops.  Respiratory: Clear to auscultation bilaterally. GI: Soft, nontender, non-distended  MS: No edema  Assessment & Plan .     Terry R Malaki Koury. is a 68 y.o. male with a hx of heart failure with improved EF (25 to 30% in 2016 improved to 50 to 55% in 2019), nonsustained ventricular tachycardia, hypertension, CKD who is being seen 10/22/2023 for the evaluation of NSTEMI and Bradycardia at the request of Dahal Binaya.   NSTEMI Admit to the ER following substernal chest pain that started after work in the evening.  Denies shortness of breath, diaphoresis, vomiting, nausea.  Has not had any chest pain since arriving to the ER EKG showed no acute ischemic changes. High-sensitivity  troponins 130 >190 > 252 > 244. Creatinine, ser  2.17 (10/22/23) this appears to be about baseline. --TTE on 10/22/22 showed LVEF of 35%, hypokinesis of the inferior septum, entire inferior wall, apical anterior segment, and apex , grade 2 diastolic dysfunction, RV systolic function is mildly reduced and is mildly enlarged. -- It has been a long time since he has taken Cialis  so may take nitroglycerin  if needed. -- Continue aspirin , atorvastatin , IV heparin .  D/c coreg  given bradycardia. -- Will discuss possible LHC with Dr Alda Amas.   Cardiomyopathy Reduced LVEF 30-35% History of heart failure with reduced EF echo on 08/2014 showed an LVEF of 25 to 30%, improved to 50 to 55% in 2019. --TTE on 10/22/22 showed LVEF of 35%, hypokinesis of the inferior septum, entire inferior wall, apical anterior segment, and apex , grade 2 diastolic dysfunction, RV systolic function is mildly reduced and is mildly enlarged. Denies having any symptoms concerning for acute heart failure. -- See NSTEMI above  GDMT -- Hold Losartan  due to reduced renal function. Will consider starting Entresto after LHC -- Will avoid BB due to bradycardia on telemetry and recent near syncope. -- Not a good candidate for SGLT2 or MRI with CKD . -- Start Imdur  30 daily. Stop  amlodipine . May no longer take Cialis .  Near Syncope Bradycardia Frequent Bigeminy and trigeminy Felt lightheaded had vision changes and fell to the floor and struck his head. Potassium4.4 (04/21 2045), Magnesium  2.4 (10/21/23) EKG shows sinus rhythm, bradycardia with a rate of 38, LBBB, Right atrial enlargment, possible short PR interval, Frequent bigeminy.  Patient has had bigeminy on previous EKGs. --Suspect that reduced LVEF contributed to near syncope -- Order orthostatic vitals as the near syncope happened after bending over and standing back up.     Hypertension PTA on amlodipine , hydrochlorothiazide , losartan . -- Stop amlodipine  10 mg daily --  Hold Losartan  due to reduced renal function. -- Stop hydrocholothiazide. -- See NSTEMI above  Hyperlipidemia --Continue atorvastatin    Stage IIIb/IV CKD Management per primary   Other conditions managed per primary For questions or updates, please contact [facility] Please consult [email or web address] for contact info under      Signed, Melita Springer, PA-C   Patient seen and examined.  Agree with above documentation.  On exam, patient is alert and oriented, regular rate and rhythm, no murmurs, lungs CTAB, no LE edema or JVD.  BP 169/75 this morning.  Echo shows EF 30 to 35%.  Initially had planned for medical management of his NSTEMI given his CKD.  However given his systolic dysfunction and considering improvement in his renal function (creatinine 2.5 > 2.17), will tentatively plan for [facility] tomorrow.  Discussed risks and benefits with patient, particularly concern for nephrotoxicity, and he is agreeable to proceeding.  Will hydrate prior to procedure.  Risks and benefits of cardiac catheterization have been discussed with the patient.  These include bleeding, infection, kidney damage, stroke, heart attack, death.  The patient understands these risks and is willing to proceed.  Addendum: Called to bedside for wide complex tachycardia,.  He is in regular wide-complex tachycardia with rate 160.  From review of telemetry, did not appear change in QRS morphology, suspect SVT with aberrancy given his known left bundle branch block.  From review of telemetry, appears likely atrial flutter with RVR, as flutter waves are seen intermittently.  He appears relatively asymptomatic, though does report some palpitations.  Will start on IV amiodarone  to maintain sinus rhythm.  He is already on heparin  drip.  Wendie Hamburg, MD

## 2023-10-23 NOTE — Progress Notes (Signed)
   10/23/23 1032  TOC Brief Assessment  Insurance and Status Reviewed  Patient has primary care physician Yes  Home environment has been reviewed Resides alone in single family home  Prior level of function: Independent with ADLs at baseline  Prior/Current Home Services No current home services  Social Drivers of Health Review SDOH reviewed no interventions necessary  Readmission risk has been reviewed Yes  Transition of care needs no transition of care needs at this time

## 2023-10-23 NOTE — Progress Notes (Signed)
 PHARMACY - ANTICOAGULATION CONSULT NOTE  Pharmacy Consult for Heparin  Indication: chest pain/ACS  Allergies  Allergen Reactions   Cefaclor Other (See Comments)    Hallucinations   Other Other (See Comments)    Deconamine was a brand name medication that contains two different drugs, an antihistamine (chlorpheniramine) and a decongestant (pseudoephedrine) = Hallucinations    Patient Measurements: Height: 6' (182.9 cm) Weight: 78 kg (171 lb 15.3 oz) IBW/kg (Calculated) : 77.6 HEPARIN  DW (KG): 78  Vital Signs: Temp: 97.6 F (36.4 C) (04/23 0436) Temp Source: Oral (04/23 0436) BP: 135/78 (04/23 0436) Pulse Rate: 97 (04/23 0436)  Labs: Recent Labs    10/21/23 2045 10/21/23 2246 10/22/23 0052 10/22/23 0254 10/22/23 0800 10/22/23 1727 10/23/23 0525  HGB 10.0*  --   --   --  9.8*  --  10.4*  HCT 32.0*  --   --   --  30.8*  --  33.5*  PLT 212  --   --   --  190  --  179  HEPARINUNFRC  --   --   --   --  0.47 0.48 0.51  CREATININE 2.53*  --   --   --   --   --  2.17*  TROPONINIHS 130* 190* 252* 244*  --   --   --     Estimated Creatinine Clearance: 35.3 mL/min (A) (by C-G formula based on SCr of 2.17 mg/dL (H)).  Medications:  No oral anticoagulation noted PTA  Assessment: 6 yoM presents with chest pain and dizziness. Elevated troponin. IV heparin  for NSTEMI--cardiology planning medical management for now given patient a poor candidate for LHC due to CKD.   Today, 10/23/2023: Heparin  level remains therapeutic (0.51) on heparin  infusion at 950 units/hr Hgb slightly low on admission but remains stable; Plt WNL No complications of therapy noted  Goal of Therapy:  Heparin  level 0.3-0.7 units/ml Monitor platelets by anticoagulation protocol: Yes   Plan:  Continue heparin  infusion at 950 units/hr Daily heparin  level & CBC Continue to monitor for s/sx of bleeding   Lolita Rise, PharmD, BCPS Clinical Pharmacist 10/23/2023 7:12 AM

## 2023-10-23 NOTE — Progress Notes (Signed)
 PROGRESS NOTE  Terry Helper Sr.  DOB: 1956-01-08  PCP: Roselind Congo, MD ZOX:[id number]  DOA: 10/21/2023  LOS: 1 day  Hospital Day: 3  Brief narrative: Terry Helper Sr. is a 68 y.o. male with PMH significant for systolic CHF (EF 25 to 30% in 8119 which improved to 50 to 55% in 2019), nonsustained V. tach, hypertension, CKD.  4/22, patient presented to the ED for evaluation of chest pain.  2009, EF 40%, stress Myoview . 2016, EF 25 to 30% on echo done for dyspnea on exertion.  He could not get cardiac cath because of CKD.  EF gradually improved. 2019, EF 50 to 55% March 2025, last seen by cardiologist Dr. Alroy Aspen  10/21/23, patient presented to ED with complaint of chest pain off and on for 2 days associated with some lightheadedness, vision changes, he also had an episode of syncope and struck the back of his head while bending over In the ED, patient was afebrile, bradycardic to 40s, blood pressure in 130s, breathing on room air Labs showed WC count of 7, hemoglobin 10, troponin 130, BUN/creatinine 53/2.53 Chest x-ray showed stable chronic findings EKG showed sinus rhythm, bradycardia the rate of 38, no ST-T wave changes Troponin was elevated to 130 > 190> 252 > 244 CTA and CT cervical spine unremarkable  Cardiology was consulted from ED.  Patient was started on heparin  drip Admitted to [facility]   Subjective: Patient was seen and examined this morning.   Lying on bed.  Not in distress no recurrent chest pain or shortness of breath.   Later this morning, he had a brief episode of wide-complex tachycardia with heart rate in 150s.   Amiodarone  drip was planned by cardiology but rhythm terminated prior to initiation of drip.  Assessment and plan: NSTEMI Presented with chest pain, shortness of breath Troponin elevated EKG without changes Seen by cardiology.   Currently on heparin  drip.  Noted plan of LHC/RHC tomorrow Currently also on aspirin , statin and Coreg . Echocardiogram  10/22/2022 showed LVEF of 35%, septal hypokinesis, grade 2 diastolic dysfunction Recent Labs    10/21/23 2045 10/21/23 2246 10/22/23 0052 10/22/23 0254  TROPONINIHS 130* 190* 252* 244*    Chronic bradycardia, bigeminy, trigeminy  wide-complex tachycardia  Patient had wide-complex tachycardia this morning.  Per cardiology, SVT with aberrancy suspected Oral amiodarone  started.  Syncope Acute combined systolic and diastolic CHF Echocardiogram 10/22/2022 showed LVEF of 35%, septal hypokinesis, grade 2 diastolic dysfunction. Beta-blocker avoided due to bradycardia. Cardiology has started on Imdur .  Noted plan to consider Entresto after LHC.     Mobility: Encourage ambulation  Goals of care   Code Status: Full Code     DVT prophylaxis:  SCDs Start: 10/22/23 0003   Antimicrobials: None Fluid: None Consultants: Cardiology Family Communication: None at bedside  Status: Inpatient Level of care:  Telemetry   Patient is from: Home Needs to continue in-hospital care: Ongoing cardiac workup.  LHC/RHC tomorrow Anticipated d/c to: Pending clinical course   Diet:  Diet Order             Diet Heart Room service appropriate? Yes; Fluid consistency: Thin  Diet effective now                   Scheduled Meds:  amiodarone   200 mg Oral BID   aspirin  EC  81 mg Oral Daily   atorvastatin   40 mg Oral Daily   isosorbide  mononitrate  30 mg Oral Daily    PRN meds:  acetaminophen  **OR** acetaminophen , nitroGLYCERIN , ondansetron  **OR** ondansetron  (ZOFRAN ) IV, oxyCODONE    Infusions:   heparin  950 Units/hr (10/22/23 2241)    Antimicrobials: Anti-infectives (From admission, onward)    None       Objective: Vitals:   10/23/23 1235 10/23/23 1322  BP: (!) 171/71 (!) 177/78  Pulse: (!) 32 62  Resp: (!) 28   Temp: 97.6 F (36.4 C) 97.6 F (36.4 C)  SpO2: 99% 100%    Intake/Output Summary (Last 24 hours) at 10/23/2023 1449 Last data filed at 10/23/2023 0900 Gross per  24 hour  Intake 1271 ml  Output --  Net 1271 ml   Filed Weights   10/21/23 2100  Weight: 78 kg   Weight change:  Body mass index is 23.32 kg/m.   Physical Exam: General exam: Pleasant, elderly African-American male Skin: No rashes, lesions or ulcers. HEENT: Atraumatic, normocephalic, no obvious bleeding Lungs: Clear to auscultation bilaterally,  CVS: Irregular rhythm, bradycardia, no murmur,   GI/Abd: Soft, nontender, nondistended, bowel sound present,   CNS: Alert, awake, oriented x 3 Psychiatry: Mood appropriate,  Extremities: No pedal edema, no calf tenderness,   Data Review: I have personally reviewed the laboratory data and studies available.  F/u labs  Unresulted Labs (From admission, onward)     Start     Ordered   10/23/23 0500  CBC  Daily,   R     Placed in "And" Linked Group   10/22/23 1840   10/23/23 0500  Heparin  level (unfractionated)  Daily,   R     Placed in "And" Linked Group   10/22/23 1840   10/23/23 0500  Basic metabolic panel  Daily,   R      10/22/23 2306           Total time spent in review of labs and imaging, patient evaluation, formulation of plan, documentation and communication with family: 45 minutes  Signed, Hoyt Macleod, MD [facility] 10/23/2023

## 2023-10-24 ENCOUNTER — Encounter (HOSPITAL_COMMUNITY): Payer: Self-pay | Admitting: Cardiovascular Disease

## 2023-10-24 ENCOUNTER — Encounter (HOSPITAL_COMMUNITY)
Admission: EM | Disposition: A | Discharge status: Home / Self Care | Payer: Self-pay | Source: Home / Self Care | Attending: Internal Medicine

## 2023-10-24 DIAGNOSIS — I2510 Atherosclerotic heart disease of native coronary artery without angina pectoris: Secondary | ICD-10-CM

## 2023-10-24 DIAGNOSIS — I4892 Unspecified atrial flutter: Secondary | ICD-10-CM | POA: Diagnosis not present

## 2023-10-24 DIAGNOSIS — I5021 Acute systolic (congestive) heart failure: Secondary | ICD-10-CM | POA: Diagnosis not present

## 2023-10-24 DIAGNOSIS — I214 Non-ST elevation (NSTEMI) myocardial infarction: Secondary | ICD-10-CM | POA: Diagnosis not present

## 2023-10-24 HISTORY — PX: RIGHT/LEFT HEART CATH AND CORONARY ANGIOGRAPHY: CATH118266

## 2023-10-24 LAB — POCT I-STAT EG7
Acid-base deficit: 1 mmol/L (ref 0.0–2.0)
Bicarbonate: 25.7 mmol/L (ref 20.0–28.0)
Calcium, Ion: 1.31 mmol/L (ref 1.15–1.40)
HCT: 31 % — ABNORMAL LOW (ref 39.0–52.0)
Hemoglobin: 10.5 g/dL — ABNORMAL LOW (ref 13.0–17.0)
O2 Saturation: 65 %
Potassium: 4.2 mmol/L (ref 3.5–5.1)
Sodium: 139 mmol/L (ref 135–145)
TCO2: 27 mmol/L (ref 22–32)
pCO2, Ven: 48.8 mmHg (ref 44–60)
pH, Ven: 7.33 (ref 7.25–7.43)
pO2, Ven: 37 mmHg (ref 32–45)

## 2023-10-24 LAB — CBC
HCT: 36.6 % — ABNORMAL LOW (ref 39.0–52.0)
Hemoglobin: 11.2 g/dL — ABNORMAL LOW (ref 13.0–17.0)
MCH: 27.7 pg (ref 26.0–34.0)
MCHC: 30.6 g/dL (ref 30.0–36.0)
MCV: 90.4 fL (ref 80.0–100.0)
Platelets: 212 10*3/uL (ref 150–400)
RBC: 4.05 MIL/uL — ABNORMAL LOW (ref 4.22–5.81)
RDW: 13.2 % (ref 11.5–15.5)
WBC: 4.7 10*3/uL (ref 4.0–10.5)
nRBC: 0 % (ref 0.0–0.2)

## 2023-10-24 LAB — POCT I-STAT 7, (LYTES, BLD GAS, ICA,H+H)
Acid-base deficit: 1 mmol/L (ref 0.0–2.0)
Bicarbonate: 24.6 mmol/L (ref 20.0–28.0)
Calcium, Ion: 1.29 mmol/L (ref 1.15–1.40)
HCT: 30 % — ABNORMAL LOW (ref 39.0–52.0)
Hemoglobin: 10.2 g/dL — ABNORMAL LOW (ref 13.0–17.0)
O2 Saturation: 96 %
Potassium: 4.2 mmol/L (ref 3.5–5.1)
Sodium: 138 mmol/L (ref 135–145)
TCO2: 26 mmol/L (ref 22–32)
pCO2 arterial: 44.1 mmHg (ref 32–48)
pH, Arterial: 7.355 (ref 7.35–7.45)
pO2, Arterial: 85 mmHg (ref 83–108)

## 2023-10-24 LAB — BASIC METABOLIC PANEL WITH GFR
Anion gap: 10 (ref 5–15)
BUN: 35 mg/dL — ABNORMAL HIGH (ref 8–23)
CO2: 22 mmol/L (ref 22–32)
Calcium: 9.1 mg/dL (ref 8.9–10.3)
Chloride: 102 mmol/L (ref 98–111)
Creatinine, Ser: 2.25 mg/dL — ABNORMAL HIGH (ref 0.61–1.24)
GFR, Estimated: 31 mL/min — ABNORMAL LOW (ref >60)
Glucose, Bld: 102 mg/dL — ABNORMAL HIGH (ref 70–99)
Potassium: 4.2 mmol/L (ref 3.5–5.1)
Sodium: 134 mmol/L — ABNORMAL LOW (ref 135–145)

## 2023-10-24 LAB — HEPARIN LEVEL (UNFRACTIONATED): Heparin Unfractionated: 0.64 [IU]/mL (ref 0.30–0.70)

## 2023-10-24 LAB — TSH: TSH: 2.306 u[IU]/mL (ref 0.350–4.500)

## 2023-10-24 MED ORDER — SODIUM CHLORIDE 0.9% FLUSH
3.0000 mL | Freq: Two times a day (BID) | INTRAVENOUS | Status: DC
  Administered 2023-10-24: 3 mL via INTRAVENOUS

## 2023-10-24 MED ORDER — HEPARIN SODIUM (PORCINE) 1000 UNIT/ML IJ SOLN
INTRAMUSCULAR | Status: DC | PRN
  Administered 2023-10-24: 4000 [IU] via INTRAVENOUS

## 2023-10-24 MED ORDER — VERAPAMIL HCL 2.5 MG/ML IV SOLN
INTRAVENOUS | Status: DC | PRN
  Administered 2023-10-24: 10 mL via INTRA_ARTERIAL

## 2023-10-24 MED ORDER — MIDAZOLAM HCL 2 MG/2ML IJ SOLN
INTRAMUSCULAR | Status: AC
  Filled 2023-10-24: qty 2

## 2023-10-24 MED ORDER — LIDOCAINE HCL (PF) 1 % IJ SOLN
INTRAMUSCULAR | Status: DC | PRN
  Administered 2023-10-24 (×2): 2 mL via INTRADERMAL

## 2023-10-24 MED ORDER — HEPARIN SODIUM (PORCINE) 1000 UNIT/ML IJ SOLN
INTRAMUSCULAR | Status: AC
  Filled 2023-10-24: qty 10

## 2023-10-24 MED ORDER — MIDAZOLAM HCL 2 MG/2ML IJ SOLN
INTRAMUSCULAR | Status: DC | PRN
  Administered 2023-10-24: 1 mg via INTRAVENOUS

## 2023-10-24 MED ORDER — HEPARIN (PORCINE) 25000 UT/250ML-% IV SOLN
950.0000 [IU]/h | INTRAVENOUS | Status: DC
  Administered 2023-10-24: 950 [IU]/h via INTRAVENOUS
  Filled 2023-10-24: qty 250

## 2023-10-24 MED ORDER — HYDRALAZINE HCL 25 MG PO TABS
25.0000 mg | ORAL_TABLET | Freq: Three times a day (TID) | ORAL | Status: DC
  Administered 2023-10-24 – 2023-10-25 (×4): 25 mg via ORAL
  Filled 2023-10-24 (×4): qty 1

## 2023-10-24 MED ORDER — IOHEXOL 350 MG/ML SOLN
INTRAVENOUS | Status: DC | PRN
  Administered 2023-10-24: 15 mL

## 2023-10-24 MED ORDER — FENTANYL CITRATE (PF) 100 MCG/2ML IJ SOLN
INTRAMUSCULAR | Status: DC | PRN
  Administered 2023-10-24: 25 ug via INTRAVENOUS

## 2023-10-24 MED ORDER — FENTANYL CITRATE (PF) 100 MCG/2ML IJ SOLN
INTRAMUSCULAR | Status: AC
Start: 2023-10-24 — End: ?
  Filled 2023-10-24: qty 2

## 2023-10-24 MED ORDER — SODIUM CHLORIDE 0.9 % IV SOLN: 250.0000 mL | INTRAVENOUS | Status: DC | PRN

## 2023-10-24 MED ORDER — LIDOCAINE HCL (PF) 1 % IJ SOLN
INTRAMUSCULAR | Status: AC
Start: 2023-10-24 — End: ?
  Filled 2023-10-24: qty 30

## 2023-10-24 MED ORDER — SODIUM CHLORIDE 0.9% FLUSH: 3.0000 mL | INTRAVENOUS | Status: DC | PRN

## 2023-10-24 MED ORDER — VERAPAMIL HCL 2.5 MG/ML IV SOLN
INTRAVENOUS | Status: AC
  Filled 2023-10-24: qty 2

## 2023-10-24 NOTE — Progress Notes (Signed)
 [facility] called. Scheduled pick up for return to [facility] at 1700.

## 2023-10-24 NOTE — H&P (View-Only) (Signed)
 Patient Name: Terry QUILLIN Sr. Date of Encounter: 10/24/2023 [facility] Cardiologist: Ahmad Alert, MD   Interval Summary  .    Reported having a headache that started yesterday evening. Denies chest pain, shortness of breath, diaphoresis, nausea, vomiting, palpitations.  Vital Signs .    Vitals:   10/23/23 1735 10/23/23 2007 10/23/23 2343 10/24/23 0457  BP: (!) 129/59 133/81 123/76 129/74  Pulse: (!) 46 (!) 105 (!) 53 60  Resp: (!) 24 17 20 16   Temp: (!) 97.5 F (36.4 C) (!) 97.5 F (36.4 C) 97.7 F (36.5 C) 97.6 F (36.4 C)  TempSrc: Oral Oral Oral Oral  SpO2: 99% 98% 100% 99%  Weight:      Height:        Intake/Output Summary (Last 24 hours) at 10/24/2023 0727 Last data filed at 10/24/2023 0603 Gross per 24 hour  Intake 824.1 ml  Output --  Net 824.1 ml      10/21/2023    9:00 PM 09/27/2023    3:27 PM 03/08/2022    4:11 PM  Last 3 Weights  Weight (lbs) 171 lb 15.3 oz 172 lb 3.2 oz 178 lb 3.2 oz  Weight (kg) 78 kg 78.109 kg 80.831 kg      Telemetry/ECG    Went into a flutter at 1140-1210 on 10/23/22 with rates of 160, had a 3 second post conversion pause. Had another episode of a flutter from 1955-2030 on 10/23/22. Continues to have frequent ectopy on rhythm - Personally Reviewed  Physical Exam .   GEN: No acute distress.   Neck: No JVD Cardiac: RRR, no murmurs, rubs, or gallops.  Respiratory: Clear to auscultation bilaterally. GI: Soft, nontender, non-distended  MS: No edema  Assessment & Plan .     Terry R Duilio Heritage. is a 68 y.o. male with a hx of heart failure with improved EF (25 to 30% in 2016 improved to 50 to 55% in 2019), nonsustained ventricular tachycardia, hypertension, CKD who is being seen 10/22/2023 for the evaluation of NSTEMI and Bradycardia at the request of Dahal Binaya.   NSTEMI Admit to the ER following substernal chest pain that started after work in the evening.  Denies shortness of breath, diaphoresis, vomiting, nausea.   Has not had any chest pain since arriving to the ER EKG showed no acute ischemic changes. High-sensitivity troponins 130 >190 > 252 > 244. Creatinine, ser  2.25 (10/24/23) this appears to be about baseline. --TTE on 10/22/22 showed LVEF of 35%, hypokinesis of the inferior septum, entire inferior wall, apical anterior segment, and apex , grade 2 diastolic dysfunction, RV systolic function is mildly reduced and is mildly enlarged. -- It has been a long time since he has taken Cialis  so may take nitroglycerin  if needed. -- Continue aspirin , atorvastatin , IV heparin .  D/c coreg  given bradycardia. -- We are planning for a LHC and RHC today. He was consented by Dr Alda Amas yesterday.   Cardiomyopathy Reduced LVEF 30-35% History of heart failure with reduced EF -- remote history of a pleurisy on his right lung for pneumonia in 1993. echo on 08/2014 showed an LVEF of 25 to 30%, improved to 50 to 55% in 2019. --TTE on 10/22/22 showed LVEF of 35%, hypokinesis of the inferior septum, entire inferior wall, apical anterior segment, and apex , grade 2 diastolic dysfunction, RV systolic function is mildly reduced and is mildly enlarged. Denies having any symptoms concerning for acute heart failure. -- See NSTEMI above   GDMT --  Hold Losartan  due to reduced renal function. Will consider starting Entresto after LHC -- Will avoid BB due to bradycardia on telemetry and recent near syncope. -- Not a good candidate for SGLT2 or MRI with CKD . --  Stop amlodipine . May no longer take Cialis . -- Received 1 dose of Imdur  yesterday and reported having a headache that started yesterday evening will stop Imdur  -- Start hydralizine 25 mg TID   Paroxysmal atrial flutter with RVR CHA2DS2-VASc Score = 3 [CHF History: 1, HTN History: 1, Diabetes History: 0, Stroke History: 0, Vascular Disease History: 0, Age Score: 1, Gender Score: 0].  Therefore, the patient's annual risk of stroke is 3.2 %.    Went into atrial flutter with  a rate of about 160 for about 15 minutes and then spontaneously converted to normal sinus rhythm.  Initially IV amiodarone  was ordered but patient spontaneously converted prior to this being started.  IV amiodarone  was held and patient was started on 200 mg of oral amiodarone  twice daily.  -- Continue the IV heparin  he was already on for NSTEMI. -- Continue oral amiodarone  200mg  twice daily. -- Ordered TSH -- Magnesium and potassium normal   Near Syncope Bradycardia Frequent Bigeminy and trigeminy Felt lightheaded had vision changes and fell to the floor and struck his head. Potassium4.4 (04/21 2045), Magnesium  2.4 (10/21/23) EKG shows sinus rhythm, bradycardia with a rate of 38, LBBB, Right atrial enlargment, possible short PR interval, Frequent bigeminy.  Patient has had bigeminy on previous EKGs. --Suspect orthostasis as symptoms occurred after standing     Hypertension PTA on amlodipine , hydrochlorothiazide , losartan . -- Stop amlodipine  10 mg daily -- Hold Losartan  due to reduced renal function. -- Stop hydrocholothiazide. -- See NSTEMI above   Hyperlipidemia --Continue atorvastatin    Stage IIIb/IV CKD Management per primary  For questions or updates, please contact [facility] Please consult [email or web address] for contact info under      Signed, Melita Springer, PA-C    Patient seen and examined.  Agree with above documentation.  On exam, patient is alert and oriented, regular rate and rhythm, no murmurs, lungs CTAB, no LE edema or JVD.  2 episodes of atrial flutter with RVR yesterday, spontaneously converted to sinus rhythm.  Started on p.o. amiodarone .  BP 129/74 this morning.  Creatinine stable at 2.25.  Plan LHC/RHC today.  Wendie Hamburg, MD

## 2023-10-24 NOTE — Progress Notes (Addendum)
 Patient Name: Terry QUILLIN Sr. Date of Encounter: 10/24/2023 [facility] Cardiologist: Ahmad Alert, MD   Interval Summary  .    Reported having a headache that started yesterday evening. Denies chest pain, shortness of breath, diaphoresis, nausea, vomiting, palpitations.  Vital Signs .    Vitals:   10/23/23 1735 10/23/23 2007 10/23/23 2343 10/24/23 0457  BP: (!) 129/59 133/81 123/76 129/74  Pulse: (!) 46 (!) 105 (!) 53 60  Resp: (!) 24 17 20 16   Temp: (!) 97.5 F (36.4 C) (!) 97.5 F (36.4 C) 97.7 F (36.5 C) 97.6 F (36.4 C)  TempSrc: Oral Oral Oral Oral  SpO2: 99% 98% 100% 99%  Weight:      Height:        Intake/Output Summary (Last 24 hours) at 10/24/2023 0727 Last data filed at 10/24/2023 0603 Gross per 24 hour  Intake 824.1 ml  Output --  Net 824.1 ml      10/21/2023    9:00 PM 09/27/2023    3:27 PM 03/08/2022    4:11 PM  Last 3 Weights  Weight (lbs) 171 lb 15.3 oz 172 lb 3.2 oz 178 lb 3.2 oz  Weight (kg) 78 kg 78.109 kg 80.831 kg      Telemetry/ECG    Went into a flutter at 1140-1210 on 10/23/22 with rates of 160, had a 3 second post conversion pause. Had another episode of a flutter from 1955-2030 on 10/23/22. Continues to have frequent ectopy on rhythm - Personally Reviewed  Physical Exam .   GEN: No acute distress.   Neck: No JVD Cardiac: RRR, no murmurs, rubs, or gallops.  Respiratory: Clear to auscultation bilaterally. GI: Soft, nontender, non-distended  MS: No edema  Assessment & Plan .     Terry R Duilio Heritage. is a 68 y.o. male with a hx of heart failure with improved EF (25 to 30% in 2016 improved to 50 to 55% in 2019), nonsustained ventricular tachycardia, hypertension, CKD who is being seen 10/22/2023 for the evaluation of NSTEMI and Bradycardia at the request of Dahal Binaya.   NSTEMI Admit to the ER following substernal chest pain that started after work in the evening.  Denies shortness of breath, diaphoresis, vomiting, nausea.   Has not had any chest pain since arriving to the ER EKG showed no acute ischemic changes. High-sensitivity troponins 130 >190 > 252 > 244. Creatinine, ser  2.25 (10/24/23) this appears to be about baseline. --TTE on 10/22/22 showed LVEF of 35%, hypokinesis of the inferior septum, entire inferior wall, apical anterior segment, and apex , grade 2 diastolic dysfunction, RV systolic function is mildly reduced and is mildly enlarged. -- It has been a long time since he has taken Cialis  so may take nitroglycerin  if needed. -- Continue aspirin , atorvastatin , IV heparin .  D/c coreg  given bradycardia. -- We are planning for a LHC and RHC today. He was consented by Dr Alda Amas yesterday.   Cardiomyopathy Reduced LVEF 30-35% History of heart failure with reduced EF -- remote history of a pleurisy on his right lung for pneumonia in 1993. echo on 08/2014 showed an LVEF of 25 to 30%, improved to 50 to 55% in 2019. --TTE on 10/22/22 showed LVEF of 35%, hypokinesis of the inferior septum, entire inferior wall, apical anterior segment, and apex , grade 2 diastolic dysfunction, RV systolic function is mildly reduced and is mildly enlarged. Denies having any symptoms concerning for acute heart failure. -- See NSTEMI above   GDMT --  Hold Losartan  due to reduced renal function. Will consider starting Entresto after LHC -- Will avoid BB due to bradycardia on telemetry and recent near syncope. -- Not a good candidate for SGLT2 or MRI with CKD . --  Stop amlodipine . May no longer take Cialis . -- Received 1 dose of Imdur  yesterday and reported having a headache that started yesterday evening will stop Imdur  -- Start hydralizine 25 mg TID   Paroxysmal atrial flutter with RVR CHA2DS2-VASc Score = 3 [CHF History: 1, HTN History: 1, Diabetes History: 0, Stroke History: 0, Vascular Disease History: 0, Age Score: 1, Gender Score: 0].  Therefore, the patient's annual risk of stroke is 3.2 %.    Went into atrial flutter with  a rate of about 160 for about 15 minutes and then spontaneously converted to normal sinus rhythm.  Initially IV amiodarone  was ordered but patient spontaneously converted prior to this being started.  IV amiodarone  was held and patient was started on 200 mg of oral amiodarone  twice daily.  -- Continue the IV heparin  he was already on for NSTEMI. -- Continue oral amiodarone  200mg  twice daily. -- Ordered TSH -- Magnesium and potassium normal   Near Syncope Bradycardia Frequent Bigeminy and trigeminy Felt lightheaded had vision changes and fell to the floor and struck his head. Potassium4.4 (04/21 2045), Magnesium  2.4 (10/21/23) EKG shows sinus rhythm, bradycardia with a rate of 38, LBBB, Right atrial enlargment, possible short PR interval, Frequent bigeminy.  Patient has had bigeminy on previous EKGs. --Suspect orthostasis as symptoms occurred after standing     Hypertension PTA on amlodipine , hydrochlorothiazide , losartan . -- Stop amlodipine  10 mg daily -- Hold Losartan  due to reduced renal function. -- Stop hydrocholothiazide. -- See NSTEMI above   Hyperlipidemia --Continue atorvastatin    Stage IIIb/IV CKD Management per primary  For questions or updates, please contact [facility] Please consult [email or web address] for contact info under      Signed, Melita Springer, PA-C    Patient seen and examined.  Agree with above documentation.  On exam, patient is alert and oriented, regular rate and rhythm, no murmurs, lungs CTAB, no LE edema or JVD.  2 episodes of atrial flutter with RVR yesterday, spontaneously converted to sinus rhythm.  Started on p.o. amiodarone .  BP 129/74 this morning.  Creatinine stable at 2.25.  Plan LHC/RHC today.  Wendie Hamburg, MD

## 2023-10-24 NOTE — Interval H&P Note (Signed)
 History and Physical Interval Note:  10/24/2023 1:46 PM  Terry R Juniel Sr.  has presented today for surgery, with the diagnosis of nstemi.  The various methods of treatment have been discussed with the patient and family. After consideration of risks, benefits and other options for treatment, the patient has consented to  Procedure(s): RIGHT/LEFT HEART CATH AND CORONARY ANGIOGRAPHY (N/A) as a surgical intervention.  The patient's history has been reviewed, patient examined, no change in status, stable for surgery.  I have reviewed the patient's chart and labs.  Questions were answered to the patient's satisfaction.     Kelin Nixon

## 2023-10-24 NOTE — Progress Notes (Signed)
 PHARMACY - ANTICOAGULATION CONSULT NOTE  Pharmacy Consult for Heparin  Indication:  NSTEMI + afib/flutter  Allergies  Allergen Reactions   Cefaclor Other (See Comments)    Hallucinations   Other Other (See Comments)    Deconamine was a brand name medication that contains two different drugs, an antihistamine (chlorpheniramine) and a decongestant (pseudoephedrine) = Hallucinations    Patient Measurements: Height: 6' (182.9 cm) Weight: 78 kg (171 lb 15.3 oz) IBW/kg (Calculated) : 77.6 HEPARIN  DW (KG): 78  Vital Signs: BP: 150/72 (04/24 1730) Pulse Rate: 39 (04/24 1730)  Labs: Recent Labs    10/21/23 2045 10/21/23 2045 10/21/23 2246 10/22/23 0052 10/22/23 0254 10/22/23 0800 10/22/23 1727 10/23/23 0525 10/24/23 0533 10/24/23 1403 10/24/23 1405  HGB 10.0*  --   --   --   --  9.8*  --  10.4* 11.2* 10.5* 10.2*  HCT 32.0*  --   --   --   --  30.8*  --  33.5* 36.6* 31.0* 30.0*  PLT 212  --   --   --   --  190  --  179 212  --   --   HEPARINUNFRC  --    < >  --   --   --  0.47 0.48 0.51 0.64  --   --   CREATININE 2.53*  --   --   --   --   --   --  2.17* 2.25*  --   --   TROPONINIHS 130*  --  190* 252* 244*  --   --   --   --   --   --    < > = values in this interval not displayed.    Estimated Creatinine Clearance: 34 mL/min (A) (by C-G formula based on SCr of 2.25 mg/dL (H)).   Medical History: Past Medical History:  Diagnosis Date   Abnormal kidney function    sees [place] [facility] x 2    Anemia    Cancer (HCC) dx dec 2019   prostate   Cardiomegaly    Chronic kidney disease    Dysrhythmia    Hypertension    Pneumonia 1993   Sinus complaint     Assessment: AC/Heme: IV hep initially for NSTEMI > possible aflutter on 4/23 - Hep level 0.64 in goal range. - 4/24: Cath: mild CAD  Goal of Therapy:  Heparin  level 0.3-0.7 units/ml Monitor platelets by anticoagulation protocol: Yes   Plan:  Resume IV heparin  at previous rate of 950 units/hr at 1820 Daily HL  and CBC   Terry Moss, PharmD, BCPS Clinical Staff Pharmacist [email or web address] Terry Moss 10/24/2023,6:24 PM

## 2023-10-24 NOTE — Progress Notes (Signed)
 PROGRESS NOTE  Terry Helper Sr.  DOB: [birth year]  PCP: Roselind Congo, MD MVH:[id number]  DOA: 10/21/2023  LOS: 2 days  Hospital Day: 4  Brief narrative: Terry Helper Sr. is a 68 y.o. male with PMH significant for systolic CHF (EF 25 to 30% in 8413 which improved to 50 to 55% in 2019), nonsustained V. tach, hypertension, CKD.  4/22, patient presented to the ED for evaluation of chest pain.  2009, EF 40%, stress Myoview . 2016, EF 25 to 30% on echo done for dyspnea on exertion.  He could not get cardiac cath because of CKD.  EF gradually improved. 2019, EF 50 to 55% March 2025, last seen by cardiologist Dr. Alroy Aspen  10/21/23, patient presented to ED with complaint of chest pain off and on for 2 days associated with some lightheadedness, vision changes, he also had an episode of syncope and struck the back of his head while bending over In the ED, patient was afebrile, bradycardic to 40s, blood pressure in 130s, breathing on room air Labs showed WBC count of 7, hemoglobin 10, troponin 130, BUN/creatinine 53/2.53 Chest x-ray showed stable chronic findings EKG showed sinus rhythm, bradycardia the rate of 38, no ST-T wave changes Troponin was elevated to 130 > 190> 252 > 244 CTA and CT cervical spine unremarkable  Cardiology was consulted from ED.  Patient was started on heparin  drip Admitted to [facility]   Subjective: Patient was seen and examined this morning.   Lying on bed.  Not in distress no recurrent chest pain or shortness of breath.   On amiodarone  low-dose since yesterday. Pending LHC RHC today  Assessment and plan: NSTEMI Presented with chest pain, shortness of breath Troponin elevated EKG without changes Seen by cardiology.   Currently on heparin  drip.  Noted plan of LHC/RHC today Currently also on aspirin , statin and Coreg . Echocardiogram 10/22/2022 showed LVEF of 35%, septal hypokinesis, grade 2 diastolic dysfunction Recent Labs    10/21/23 2045 10/21/23 2246  10/22/23 0052 10/22/23 0254  TROPONINIHS 130* 190* 252* 244*    Chronic bradycardia, bigeminy, trigeminy  wide-complex tachycardia  Patient had wide-complex tachycardia yesterday.  Per cardiology, SVT with aberrancy suspected Oral amiodarone  started.  Syncope Acute combined systolic and diastolic CHF Echocardiogram 10/22/2022 showed LVEF of 35%, septal hypokinesis, grade 2 diastolic dysfunction. Beta-blocker avoided due to bradycardia. Cardiology has started on Imdur .  Noted plan to switch to hydralazine  today because of headache.   Noted plan to consider Entresto after LHC.   Mobility: Encourage ambulation  Goals of care   Code Status: Full Code    DVT prophylaxis:  SCDs Start: 10/22/23 0003   Antimicrobials: None Fluid: None Consultants: Cardiology Family Communication: None at bedside  Status: Inpatient Level of care:  Telemetry   Patient is from: Home Needs to continue in-hospital care: Ongoing cardiac workup.  LHC/RHC tomorrow Anticipated d/c to: Pending clinical course   Diet:  Diet Order             Diet NPO time specified Except for: Sips with Meds  Diet effective midnight                   Scheduled Meds:  [MAR Hold] amiodarone   200 mg Oral BID   [MAR Hold] aspirin  EC  81 mg Oral Daily   [MAR Hold] atorvastatin   40 mg Oral Daily   [MAR Hold] hydrALAZINE   25 mg Oral Q8H    PRN meds: [MAR Hold] acetaminophen  **OR** [MAR Hold] acetaminophen , [MAR  Hold] nitroGLYCERIN , [MAR Hold] ondansetron  **OR** [MAR Hold] ondansetron  (ZOFRAN ) IV, [MAR Hold] oxyCODONE    Infusions:   sodium chloride  75 mL/hr at 10/24/23 1033   heparin  950 Units/hr (10/24/23 0140)    Antimicrobials: Anti-infectives (From admission, onward)    None       Objective: Vitals:   10/24/23 0457 10/24/23 1219  BP: 129/74 (!) 160/93  Pulse: 60 (!) 53  Resp: 16 16  Temp: 97.6 F (36.4 C)   SpO2: 99% 100%    Intake/Output Summary (Last 24 hours) at 10/24/2023 1343 Last  data filed at 10/24/2023 0603 Gross per 24 hour  Intake 584.1 ml  Output --  Net 584.1 ml   Filed Weights   10/21/23 2100  Weight: 78 kg   Weight change:  Body mass index is 23.32 kg/m.   Physical Exam: General exam: Pleasant, elderly African-American male Skin: No rashes, lesions or ulcers. HEENT: Atraumatic, normocephalic, no obvious bleeding Lungs: Clear to auscultation bilaterally,  CVS: Irregular rhythm, bradycardia, no murmur,   GI/Abd: Soft, nontender, nondistended, bowel sound present,   CNS: Alert, awake, oriented x 3 Psychiatry: Mood appropriate,  Extremities: No pedal edema, no calf tenderness,   Data Review: I have personally reviewed the laboratory data and studies available.  F/u labs  Unresulted Labs (From admission, onward)     Start     Ordered   10/23/23 0500  CBC  Daily,   R     Placed in "And" Linked Group   10/22/23 1840   10/23/23 0500  Heparin  level (unfractionated)  Daily,   R     Placed in "And" Linked Group   10/22/23 1840   10/23/23 0500  Basic metabolic panel  Daily,   R      10/22/23 2306           Total time spent in review of labs and imaging, patient evaluation, formulation of plan, documentation and communication with family: 45 minutes  Signed, Hoyt Macleod, MD [facility] 10/24/2023

## 2023-10-24 NOTE — Progress Notes (Signed)
 PHARMACY - ANTICOAGULATION CONSULT NOTE  Pharmacy Consult for Heparin  Indication: chest pain/ACS  Allergies  Allergen Reactions   Cefaclor Other (See Comments)    Hallucinations   Other Other (See Comments)    Deconamine was a brand name medication that contains two different drugs, an antihistamine (chlorpheniramine) and a decongestant (pseudoephedrine) = Hallucinations    Patient Measurements: Height: 6' (182.9 cm) Weight: 78 kg (171 lb 15.3 oz) IBW/kg (Calculated) : 77.6 HEPARIN  DW (KG): 78  Vital Signs: Temp: 97.6 F (36.4 C) (04/24 0457) Temp Source: Oral (04/24 0457) BP: 129/74 (04/24 0457) Pulse Rate: 60 (04/24 0457)  Labs: Recent Labs    10/21/23 2045 10/21/23 2045 10/21/23 2246 10/22/23 0052 10/22/23 0254 10/22/23 0800 10/22/23 1727 10/23/23 0525 10/24/23 0533  HGB 10.0*  --   --   --   --  9.8*  --  10.4* 11.2*  HCT 32.0*  --   --   --   --  30.8*  --  33.5* 36.6*  PLT 212  --   --   --   --  190  --  179 212  HEPARINUNFRC  --    < >  --   --   --  0.47 0.48 0.51 0.64  CREATININE 2.53*  --   --   --   --   --   --  2.17* 2.25*  TROPONINIHS 130*  --  190* 252* 244*  --   --   --   --    < > = values in this interval not displayed.    Estimated Creatinine Clearance: 34 mL/min (A) (by C-G formula based on SCr of 2.25 mg/dL (H)).  Medications:  No oral anticoagulation noted PTA  Assessment: 61 yoM presents with chest pain and dizziness. Elevated troponin. IV heparin  for NSTEMI--cardiology initially planning medical management given patient likely a poor candidate for LHC due to CKD.   Tentative plan for cath today, 4/24. Patient with what appeared to be aflutter on 4/23, continues on heparin .  Today, 10/24/2023: Heparin  level remains therapeutic (0.64) on heparin  infusion at 950 units/hr CBC stable No complications of therapy noted  Goal of Therapy:  Heparin  level 0.3-0.7 units/ml Monitor platelets by anticoagulation protocol: Yes   Plan:   Continue heparin  infusion at 950 units/hr Daily heparin  level & CBC Continue to monitor for s/sx of bleeding Will continue to follow closely for need for long term anticoagulation in setting of aflutter   Lolita Rise, PharmD, BCPS Clinical Pharmacist 10/24/2023 7:27 AM

## 2023-10-24 NOTE — Progress Notes (Signed)
 TR BAND REMOVAL  LOCATION:    Right radial  DEFLATED PER PROTOCOL:    Yes  TIME BAND OFF / DRESSING APPLIED:    1630pm a clean dry dressing applied with gauze and tegaderm, covered with coban   SITE UPON ARRIVAL:    Level 0  SITE AFTER BAND REMOVAL:    Level 0  CIRCULATION SENSATION AND MOVEMENT:    Within Normal Limits   Yes.    COMMENTS:   Care instructions given to patient.

## 2023-10-25 ENCOUNTER — Other Ambulatory Visit (HOSPITAL_COMMUNITY): Payer: Self-pay

## 2023-10-25 ENCOUNTER — Telehealth (HOSPITAL_COMMUNITY): Payer: Self-pay | Admitting: Pharmacy Technician

## 2023-10-25 DIAGNOSIS — I4892 Unspecified atrial flutter: Secondary | ICD-10-CM | POA: Diagnosis not present

## 2023-10-25 DIAGNOSIS — I5021 Acute systolic (congestive) heart failure: Secondary | ICD-10-CM | POA: Diagnosis not present

## 2023-10-25 DIAGNOSIS — I214 Non-ST elevation (NSTEMI) myocardial infarction: Secondary | ICD-10-CM | POA: Diagnosis not present

## 2023-10-25 LAB — BASIC METABOLIC PANEL WITH GFR
Anion gap: 10 (ref 5–15)
BUN: 32 mg/dL — ABNORMAL HIGH (ref 8–23)
CO2: 25 mmol/L (ref 22–32)
Calcium: 9.3 mg/dL (ref 8.9–10.3)
Chloride: 101 mmol/L (ref 98–111)
Creatinine, Ser: 2.1 mg/dL — ABNORMAL HIGH (ref 0.61–1.24)
GFR, Estimated: 33 mL/min — ABNORMAL LOW (ref >60)
Glucose, Bld: 94 mg/dL (ref 70–99)
Potassium: 4.3 mmol/L (ref 3.5–5.1)
Sodium: 136 mmol/L (ref 135–145)

## 2023-10-25 LAB — CBC
HCT: 34.8 % — ABNORMAL LOW (ref 39.0–52.0)
Hemoglobin: 11 g/dL — ABNORMAL LOW (ref 13.0–17.0)
MCH: 27.7 pg (ref 26.0–34.0)
MCHC: 31.6 g/dL (ref 30.0–36.0)
MCV: 87.7 fL (ref 80.0–100.0)
Platelets: 222 10*3/uL (ref 150–400)
RBC: 3.97 MIL/uL — ABNORMAL LOW (ref 4.22–5.81)
RDW: 13.3 % (ref 11.5–15.5)
WBC: 4.2 10*3/uL (ref 4.0–10.5)
nRBC: 0 % (ref 0.0–0.2)

## 2023-10-25 LAB — HEPARIN LEVEL (UNFRACTIONATED): Heparin Unfractionated: 0.43 [IU]/mL (ref 0.30–0.70)

## 2023-10-25 MED ORDER — NITROGLYCERIN 0.4 MG SL SUBL: 0.4000 mg | SUBLINGUAL_TABLET | SUBLINGUAL | 12 refills | Status: AC | PRN

## 2023-10-25 MED ORDER — AMIODARONE HCL 200 MG PO TABS: 200.0000 mg | ORAL_TABLET | Freq: Two times a day (BID) | ORAL | Status: DC

## 2023-10-25 MED ORDER — AMIODARONE HCL 200 MG PO TABS
ORAL_TABLET | ORAL | 0 refills | Status: DC
Start: 2023-10-25 — End: 2023-11-17

## 2023-10-25 MED ORDER — ISOSORBIDE MONONITRATE ER 30 MG PO TB24: 15.0000 mg | ORAL_TABLET | Freq: Every day | ORAL | 0 refills | Status: DC

## 2023-10-25 MED ORDER — HYDRALAZINE HCL 25 MG PO TABS: 25.0000 mg | ORAL_TABLET | Freq: Three times a day (TID) | ORAL | 0 refills | Status: DC

## 2023-10-25 MED ORDER — AMIODARONE HCL 200 MG PO TABS
200.0000 mg | ORAL_TABLET | Freq: Every day | ORAL | Status: DC
Start: 2023-11-09 — End: 2023-10-25

## 2023-10-25 MED ORDER — APIXABAN 5 MG PO TABS
5.0000 mg | ORAL_TABLET | Freq: Two times a day (BID) | ORAL | Status: DC
  Administered 2023-10-25: 5 mg via ORAL
  Filled 2023-10-25: qty 1

## 2023-10-25 MED ORDER — ATORVASTATIN CALCIUM 40 MG PO TABS: 40.0000 mg | ORAL_TABLET | Freq: Every day | ORAL | 0 refills | Status: AC

## 2023-10-25 MED ORDER — ISOSORBIDE MONONITRATE ER 30 MG PO TB24
15.0000 mg | ORAL_TABLET | Freq: Every day | ORAL | Status: DC
  Administered 2023-10-25: 15 mg via ORAL
  Filled 2023-10-25: qty 1

## 2023-10-25 MED ORDER — APIXABAN 5 MG PO TABS: 5.0000 mg | ORAL_TABLET | Freq: Two times a day (BID) | ORAL | 0 refills | Status: AC

## 2023-10-25 NOTE — Telephone Encounter (Signed)
 Patient Product/process development scientist completed.    The patient is insured through [facility]. Patient has [employer], may use a copay card, and/or apply for patient assistance if available.    Ran test claim for Eliquis  5 mg and the current 30 day co-pay is $110.00.   This test claim was processed through [facility]- copay amounts may vary at other pharmacies due to pharmacy/plan contracts, or as the patient moves through the different stages of their insurance plan.     Morgan Arab, CPHT Pharmacy Technician III Certified Patient Advocate [facility] Patient Advocate Team Direct Number: [phone]  Fax: [phone]

## 2023-10-25 NOTE — Progress Notes (Addendum)
 Patient Name: DAYNA GEURTS Sr. Date of Encounter: 10/25/2023 [facility] Cardiologist: Ahmad Alert, MD   Interval Summary  .    BP 129/79.  Creatinine stable at 2.1.  Denies any chest pain or dyspnea  Vital Signs .    Vitals:   10/25/23 0130 10/25/23 0230 10/25/23 0532 10/25/23 1028  BP: 138/76 (!) 142/78 (!) 140/76 129/79  Pulse: (!) 53  (!) 55 67  Resp:  17    Temp: 97.8 F (36.6 C) 97.7 F (36.5 C) 97.7 F (36.5 C) 98.2 F (36.8 C)  TempSrc: Oral Oral Oral Oral  SpO2: 100% 100% 100% 100%  Weight:      Height:        Intake/Output Summary (Last 24 hours) at 10/25/2023 1327 Last data filed at 10/25/2023 1013 Gross per 24 hour  Intake 1045.57 ml  Output --  Net 1045.57 ml      10/21/2023    9:00 PM 09/27/2023    3:27 PM 03/08/2022    4:11 PM  Last 3 Weights  Weight (lbs) 171 lb 15.3 oz 172 lb 3.2 oz 178 lb 3.2 oz  Weight (kg) 78 kg 78.109 kg 80.831 kg      Telemetry/ECG    Went into a flutter at 1140-1210 on 10/23/22 with rates of 160, had a 3 second post conversion pause. Had another episode of a flutter from 1955-2030 on 10/23/22. Continues to have frequent ectopy on rhythm - Personally Reviewed  Physical Exam .   GEN: No acute distress.   Neck: No JVD Cardiac: RRR, no murmurs, rubs, or gallops.  Respiratory: Clear to auscultation bilaterally. GI: Soft, nontender, non-distended  MS: No edema  Assessment & Plan .     Clent R Tully Mcinturff. is a 68 y.o. male with a hx of heart failure with improved EF (25 to 30% in 2016 improved to 50 to 55% in 2019), nonsustained ventricular tachycardia, hypertension, CKD who is being seen 10/22/2023 for the evaluation of NSTEMI and Bradycardia at the request of Dahal Binaya.   NSTEMI Admit to the ER following substernal chest pain that started after work in the evening.  Denies shortness of breath, diaphoresis, vomiting, nausea.  Has not had any chest pain since arriving to the ER EKG showed no acute ischemic  changes. High-sensitivity troponins 130 >190 > 252 > 244. Creatinine, ser  2.25 (10/24/23) this appears to be about baseline. --TTE on 10/22/22 showed LVEF of 35%, hypokinesis of the inferior septum, entire inferior wall, apical anterior segment, and apex , grade 2 diastolic dysfunction, RV systolic function is mildly reduced and is mildly enlarged. -- LHC/RHC on 4/24 showed mild nonobstructive CAD, normal filling pressures and cardiac output (RA 8, PCWP 10, CI 2.8).  Suspect demand ischemia, could be due to arrhythmia -- Continue atorvastatin , D/c'd coreg  given bradycardia.  Can stop aspirin  given he will be on Eliquis    Cardiomyopathy Reduced LVEF 30-35% History of heart failure with reduced EF -- remote history of a pleurisy on his right lung for pneumonia in 1993. echo on 08/2014 showed an LVEF of 25 to 30%, improved to 50 to 55% in 2019. --TTE on 10/22/22 showed LVEF of 35%, hypokinesis of the inferior septum, entire inferior wall, apical anterior segment, and apex , grade 2 diastolic dysfunction, RV systolic function is mildly reduced and is mildly enlarged. Denies having any symptoms concerning for acute heart failure.  GDMT -- Hold Losartan  due to reduced renal function.  -- Will avoid BB  due to bradycardia on telemetry and recent near syncope. -- Not a good candidate for SGLT2 or MRI with CKD . --  Stop amlodipine . May no longer take Cialis . -- Received 1 dose of Imdur  30 mg daily and reported headache so was discontinued.  Will restart at 15 mg daily -- Continue hydralizine 25 mg TID   Paroxysmal atrial flutter with RVR CHA2DS2-VASc Score = 3 [CHF History: 1, HTN History: 1, Diabetes History: 0, Stroke History: 0, Vascular Disease History: 0, Age Score: 1, Gender Score: 0].  Therefore, the patient's annual risk of stroke is 3.2 %.    Went into atrial flutter with a rate of about 160 for about 15 minutes and then spontaneously converted to normal sinus rhythm.  Initially IV amiodarone   was ordered but patient spontaneously converted prior to this being started.  IV amiodarone  was held and patient was started on 200 mg of oral amiodarone  twice daily.  -- Continue oral amiodarone  200mg  twice daily.  Recommend amiodarone  200 mg twice daily x 2 weeks, then decrease to 200 mg daily --Switch from heparin  drip to Eliquis .  Cost will be $110 per month for Eliquis .  Will give 30-day free card and patient plans to apply for assistance    Near Syncope Bradycardia Frequent Bigeminy and trigeminy Felt lightheaded had vision changes and fell to the floor and struck his head. Potassium4.4 (04/21 2045), Magnesium  2.4 (10/21/23) EKG shows sinus rhythm, bradycardia with a rate of 38, LBBB, Right atrial enlargment, possible short PR interval, Frequent bigeminy.  Patient has had bigeminy on previous EKGs. --Suspect orthostasis as symptoms occurred after standing   Hypertension PTA on amlodipine , hydrochlorothiazide , losartan . -- Stop amlodipine  10 mg daily -- Hold Losartan  due to reduced renal function. -- Stop hydrocholothiazide. -- Started Imdur /hydralazine  as above   Hyperlipidemia --Continue atorvastatin    Stage IIIb/IV CKD Management per primary  [facility] will sign off.   Medication Recommendations: Amiodarone  200 mg twice daily x 2 weeks, then decrease to 200 mg daily.  Eliquis  5 mg twice daily.  Hydralazine  25 mg 3 times daily, Imdur  15 mg daily.  Discontinued home amlodipine , losartan , and HCTZ Other recommendations (labs, testing, etc): BMET in 1 week.  Has cardiac MRI scheduled next month Follow up as an outpatient: Will schedule   For questions or updates, please contact [facility] Please consult [email or web address] for contact info under      Signed, Wendie Hamburg, MD

## 2023-10-25 NOTE — Discharge Summary (Signed)
 Physician Discharge Summary  Terry Moss OZH:[id number] DOB: [birth year] DOA: 10/21/2023  PCP: Roselind Congo, MD  Admit date: 10/21/2023 Discharge date: 10/25/2023  Admitted From: Home Discharge disposition: Home  Recommendations at discharge:  Events started on Imdur  15 mg daily, hydralazine  25 mg 3 times daily Amlodipine  thiazide and losartan  have been stopped.  Do not use Cialis  You have been started on Eliquis  which is a blood thinner.  Please watch out for any obvious bleeding, black stool or easy bruisability You have been started on amiodarone  -200 mg twice daily for 14 days followed by 20 mg daily   Brief narrative: Terry Moss. is a 68 y.o. male with PMH significant for systolic CHF (EF 25 to 30% in 6295 which improved to 50 to 55% in 2019), nonsustained V. tach, hypertension, CKD.  4/22, patient presented to the ED for evaluation of chest pain.  2009, EF 40%, stress Myoview . 2016, EF 25 to 30% on echo done for dyspnea on exertion.  He could not get cardiac cath because of CKD.  EF gradually improved. 2019, EF 50 to 55% March 2025, last seen by cardiologist Dr. Alroy Aspen  10/21/23, patient presented to ED with complaint of chest pain off and on for 2 days associated with some lightheadedness, vision changes, he also had an episode of syncope and struck the back of his head while bending over In the ED, patient was afebrile, bradycardic to 40s, blood pressure in 130s, breathing on room air Labs showed WBC count of 7, hemoglobin 10, troponin 130, BUN/creatinine 53/2.53 Chest x-ray showed stable chronic findings EKG showed sinus rhythm, bradycardia the rate of 38, no ST-T wave changes Troponin was elevated to 130 > 190> 252 > 244 CTA and CT cervical spine unremarkable  Cardiology was consulted from ED.  Patient was started on heparin  drip Admitted to [facility]   Subjective: Patient was seen and examined this morning.   Sitting up in chair.  Not in distress.  No  chest pain.  Able to ambulate on the hallway  Hospital course: NSTEMI Presented with chest pain, shortness of breath Troponin elevated EKG without changes Seen by cardiology.   Initially started on heparin  drip.   4/24, underwent LHC/RHC.  No significant coronary artery disease noted. Currently also on aspirin , statin and Coreg . Echocardiogram 10/22/2022 showed LVEF of 35%, septal hypokinesis, grade 2 diastolic dysfunction   A flutter with RVR Patient has h/o chronic bradycardia, bigeminy, trigeminy. 4/23, he had episodes of a flutter with RVR up to 160s. Oral amiodarone  was started.  Discharged on amiodarone  200 mg twice daily for 14 days followed by 200 mg daily Eliquis  for anticoagulation.  Syncope Acute combined systolic and diastolic CHF Cardiomyopathy Echocardiogram 10/22/2022 showed LVEF of 35%, septal hypokinesis, grade 2 diastolic dysfunction. Beta-blocker avoided due to bradycardia. Beta-blocker not initiated because of chronic bradycardia Amlodipine  was stopped.  May no longer take Cialis  Cardiology started the patient on Imdur  15 mg daily and hydralazine  25 mg 3 times daily   Goals of care   Code Status: Full Code   Diet:  Diet Order             Diet - low sodium heart healthy           Diet Heart Room service appropriate? Yes; Fluid consistency: Thin  Diet effective now                   Nutritional status:  Body mass index is 23.32  kg/m.       Wounds:  - Incision - 6 Ports Abdomen 1: Left;Lateral;Lower 2: Left;Lateral;Upper 3: Umbilicus;Superior 4: Right;Lateral;Lower 5: Right;Medial;Upper 6: Right;Lateral;Upper (Active)  Placement Date: 11/14/18   Location of Ports: Abdomen  Port: 1:  Location Orientation: Left;Lateral;Lower  Port: 2:  Location Orientation: Left;Lateral;Upper  Port: 3:  Location Orientation: Umbilicus;Superior  Port: 4:  Location Orientation: Right;La...    Assessments 11/14/2018 10:00 AM 11/15/2018  9:54 AM  Port 1 Site  Assessment -- [employer] 1 Margins -- Attached edges (approximated)  Port 1 Drainage Amount -- None  Port 1 Dressing Type Gauze (Comment) Gauze (Comment)  Port 1 Dressing Status -- Reinforced;New drainage  Port 2 Site Assessment -- Clean;Dry  Port 2 Margins -- Attached edges (approximated)  Port 2 Drainage Amount -- None  Port 2 Dressing Type Liquid skin adhesive Liquid skin adhesive  Port 2 Dressing Status -- Clean;Dry;Intact  Port 3 Site Assessment -- Clean;Dry  Port 3 Margins -- Attached edges (approximated)  Port 3 Drainage Amount -- None  Port 3 Dressing Type Liquid skin adhesive Liquid skin adhesive  Port 3 Dressing Status -- Clean;Dry;Intact  Port 4 Site Assessment -- Clean;Dry  Port 4 Margins -- Attached edges (approximated)  Port 4 Drainage Amount -- None  Port 4 Dressing Type Liquid skin adhesive Liquid skin adhesive  Port 4 Dressing Status -- Clean;Dry;Intact  Port 5 Site Assessment -- Clean;Dry  Port 5 Margins -- Attached edges (approximated)  Port 5 Drainage Amount -- None  Port 5 Dressing Type Liquid skin adhesive Liquid skin adhesive  Port 5 Dressing Status -- Dry;Intact;Clean  Port 6 Site Assessment -- Clean;Dry  Port 6 Margins -- Attached edges (approximated)  Port 6 Drainage Amount -- None  Port 6 Dressing Type Liquid skin adhesive Liquid skin adhesive  Port 6 Dressing Status -- Clean;Dry;Intact     No associated orders.     Incision (Closed) 11/14/18 Abdomen (Active)  Date First Assessed/Time First Assessed: 11/14/18 1053   Location: Abdomen    Assessments 11/14/2018 11:53 AM 11/15/2018  9:54 AM  Dressing Type Liquid skin adhesive Liquid skin adhesive  Dressing Dry;Intact;Clean Dry;Intact;Clean  Site / Wound Assessment Clean;Dry Clean;Dry  Margins Attached edges (approximated) Attached edges (approximated)  Closure Skin glue Skin glue  Drainage Amount None None  Treatment -- Other (Comment)     No associated orders.    Discharge Exam:   Vitals:    10/25/23 0130 10/25/23 0230 10/25/23 0532 10/25/23 1028  BP: 138/76 (!) 142/78 (!) 140/76 129/79  Pulse: (!) 53  (!) 55 67  Resp:  17    Temp: 97.8 F (36.6 C) 97.7 F (36.5 C) 97.7 F (36.5 C) 98.2 F (36.8 C)  TempSrc: Oral Oral Oral Oral  SpO2: 100% 100% 100% 100%  Weight:      Height:        Body mass index is 23.32 kg/m.  General exam: Pleasant, elderly African-American male Skin: No rashes, lesions or ulcers. HEENT: Atraumatic, normocephalic, no obvious bleeding Lungs: Clear to auscultation bilaterally,  CVS: Irregular rhythm, bradycardia, no murmur,   GI/Abd: Soft, nontender, nondistended, bowel sound present,   CNS: Alert, awake, oriented x 3 Psychiatry: Mood appropriate,  Extremities: No pedal edema, no calf tenderness,   Follow ups:    Follow-up Information     Roselind Congo, MD Follow up.   Specialty: Family Medicine Contact information: [phone] [place] [address] [phone]  Discharge Instructions:   Discharge Instructions     AMB referral to Phase II Cardiac Rehabilitation   Complete by: As directed    Diagnosis: Heart Failure (see criteria below if ordering Phase II)   Heart Failure Type: Chronic Systolic   After initial evaluation and assessments completed: Virtual Based Care may be provided alone or in conjunction with Phase 2 Cardiac Rehab based on patient barriers.: Yes   Intensive Cardiac Rehabilitation (ICR) [facility] location only OR Traditional Cardiac Rehabilitation (TCR) *If criteria for ICR are not met will enroll in TCR [facility] only): Yes   Call MD for:  difficulty breathing, headache or visual disturbances   Complete by: As directed    Call MD for:  extreme fatigue   Complete by: As directed    Call MD for:  hives   Complete by: As directed    Call MD for:  persistant dizziness or light-headedness   Complete by: As directed    Call MD for:  persistant nausea and vomiting   Complete by:  As directed    Call MD for:  severe uncontrolled pain   Complete by: As directed    Call MD for:  temperature >100.4   Complete by: As directed    Diet - low sodium heart healthy   Complete by: As directed    Discharge instructions   Complete by: As directed    Recommendations at discharge:   Events started on Imdur  15 mg daily, hydralazine  25 mg 3 times daily  Amlodipine  thiazide and losartan  have been stopped.  Do not use Cialis   You have been started on Eliquis  which is a blood thinner.  Please watch out for any obvious bleeding, black stool or easy bruisability  You have been started on amiodarone  -200 mg twice daily for 14 days followed by 20 mg daily   You have been started on a blood thinner.  Please watch out for any obvious bleeding, black stool or easy bruisability.  Please contact your primary care provider for further recommendation.    Discharge instructions for CHF Check weight daily -preferably same time every day. Restrict fluid intake to 1200 ml daily Restrict salt intake to less than 2 g daily. Call MD if you have one of the following symptoms 1) 3 pound weight gain in 24 hours or 5 pounds in 1 week  2) swelling in the hands, feet or stomach  3) progressive shortness of breath 4) if you have to sleep on extra pillows at night in order to breathe       General discharge instructions: Follow with Primary MD Roselind Congo, MD in 7 days  Please request your PCP  to go over your hospital tests, procedures, radiology results at the follow up. Please get your medicines reviewed and adjusted.  Your PCP may decide to repeat certain labs or tests as needed. Do not drive, operate heavy machinery, perform activities at heights, swimming or participation in water  activities or provide baby sitting services if your were admitted for syncope or siezures until you have seen by Primary MD or a Neurologist and advised to do so again. [place]  Controlled Substance  Reporting System database was reviewed. Do not drive, operate heavy machinery, perform activities at heights, swim, participate in water  activities or provide baby-sitting services while on medications for pain, sleep and mood until your outpatient physician has reevaluated you and advised to do so again.  You are strongly recommended to comply with the dose, frequency and  duration of prescribed medications. Activity: As tolerated with Full fall precautions use walker/cane & assistance as needed Avoid using any recreational substances like cigarette, tobacco, alcohol, or non-prescribed drug. If you experience worsening of your admission symptoms, develop shortness of breath, life threatening emergency, suicidal or homicidal thoughts you must seek medical attention immediately by calling 911 or calling your MD immediately  if symptoms less severe. You must read complete instructions/literature along with all the possible adverse reactions/side effects for all the medicines you take and that have been prescribed to you. Take any new medicine only after you have completely understood and accepted all the possible adverse reactions/side effects.  Wear Seat belts while driving. You were cared for by a hospitalist during your hospital stay. If you have any questions about your discharge medications or the care you received while you were in the hospital after you are discharged, you can call the unit and ask to speak with the hospitalist or the covering physician. Once you are discharged, your primary care physician will handle any further medical issues. Please note that NO REFILLS for any discharge medications will be authorized once you are discharged, as it is imperative that you return to your primary care physician (or establish a relationship with a primary care physician if you do not have one).   Increase activity slowly   Complete by: As directed        Discharge Medications:   Allergies as of  10/25/2023       Reactions   Cefaclor Other (See Comments)   Hallucinations   Other Other (See Comments)   Deconamine was a brand name medication that contains two different drugs, an antihistamine (chlorpheniramine) and a decongestant (pseudoephedrine) = Hallucinations        Medication List     STOP taking these medications    amLODipine  10 MG tablet Commonly known as: NORVASC    hydrochlorothiazide  25 MG tablet Commonly known as: HYDRODIURIL    losartan  100 MG tablet Commonly known as: COZAAR    predniSONE 50 MG tablet Commonly known as: DELTASONE       TAKE these medications    amiodarone  200 MG tablet Commonly known as: PACERONE  Take 1 tablet (200 mg total) by mouth 2 (two) times daily for 14 days, THEN 1 tablet (200 mg total) daily. Start taking on: October 25, 2023   apixaban  5 MG Tabs tablet Commonly known as: ELIQUIS  Take 1 tablet (5 mg total) by mouth 2 (two) times daily.   atorvastatin  40 MG tablet Commonly known as: LIPITOR Take 1 tablet (40 mg total) by mouth daily. Start taking on: October 26, 2023   colchicine  0.6 MG tablet Take 0.6 mg by mouth 2 (two) times daily as needed (for gout flares).   hydrALAZINE  25 MG tablet Commonly known as: APRESOLINE  Take 1 tablet (25 mg total) by mouth every 8 (eight) hours. What changed:  how much to take when to take this   isosorbide  mononitrate 30 MG 24 hr tablet Commonly known as: IMDUR  Take 0.5 tablets (15 mg total) by mouth daily. Start taking on: October 26, 2023   nitroGLYCERIN  0.4 MG SL tablet Commonly known as: NITROSTAT  Place 1 tablet (0.4 mg total) under the tongue every 5 (five) minutes x 3 doses as needed for chest pain.   UNKNOWN TO PATIENT Place 1 drop into both eyes See admin instructions. Unnamed OTC eye drops (name possibly starts with "Med") for redness/dryness/itching : Instill 1 drop into both eyes up to three times a  day as needed for dryness or irritation   Vitamin D3 50 MCG (2000 UT)  Tabs Take 2,000 Units by mouth daily after breakfast.         The results of significant diagnostics from this hospitalization (including imaging, microbiology, ancillary and laboratory) are listed below for reference.    Procedures and Diagnostic Studies:   ECHOCARDIOGRAM COMPLETE Result Date: 10/22/2023    ECHOCARDIOGRAM REPORT   Patient Name:   Terry CORRELL Sr. Date of Exam: 10/22/2023 Medical Rec #:  [id number]          Height:       72.0 in Accession #:    [id number]         Weight:       172.0 lb Date of Birth:  [birth year]          BSA:          1.998 m Patient Age:    68 years           BP:           102/60 mmHg Patient Gender: M                  HR:           44 bpm. Exam Location:  Inpatient Procedure: 2D Echo, Cardiac Doppler, Color Doppler and Intracardiac            Opacification Agent (Both Spectral and Color Flow Doppler were            utilized during procedure). Indications:    122-I22.9 Subsequent ST elevation (STEM) and non-ST elevation                 (NSTEMI) myocardial infarction  History:        Patient has prior history of Echocardiogram examinations, most                 recent 01/13/2018. CHF, Previous Myocardial Infarction, Abnormal                 ECG; Risk Factors:Hypertension and Dyslipidemia. Cancer.  Sonographer:    Raynelle Callow RDCS Referring Phys: [id number] ROBERT DORRELL IMPRESSIONS  1. Left ventricular ejection fraction, by estimation, is 30 to 35%. The left ventricle has moderately decreased function. The left ventricle demonstrates regional wall motion abnormalities (see scoring diagram/findings for description). Left ventricular  diastolic parameters are consistent with Grade II diastolic dysfunction (pseudonormalization). The inferior septum, entire inferior wall, apical anterior segment, and apex are hypokinetic.  2. Right ventricular systolic function is mildly reduced. The right ventricular size is mildly enlarged. Tricuspid regurgitation signal is inadequate for  assessing PA pressure.  3. The mitral valve is normal in structure. Trivial mitral valve regurgitation. No evidence of mitral stenosis.  4. The aortic valve is tricuspid. Aortic valve regurgitation is not visualized. No aortic stenosis is present.  5. The inferior vena cava is normal in size with greater than 50% respiratory variability, suggesting right atrial pressure of 3 mmHg. Conclusion(s)/Recommendation(s): No left ventricular mural or apical thrombus/thrombi. FINDINGS  Left Ventricle: Left ventricular ejection fraction, by estimation, is 30 to 35%. The left ventricle has moderately decreased function. The left ventricle demonstrates regional wall motion abnormalities. Definity  contrast agent was given IV to delineate the left ventricular endocardial borders. The left ventricular internal cavity size was normal in size. There is no left ventricular hypertrophy. Left ventricular diastolic parameters are consistent with Grade II diastolic dysfunction (pseudonormalization).  LV Wall Scoring: The  inferior septum, entire inferior wall, apical anterior segment, and apex are hypokinetic. The inferior septum, entire inferior wall, apical anterior segment, and apex are hypokinetic. Right Ventricle: The right ventricular size is mildly enlarged. Right vetricular wall thickness was not well visualized. Right ventricular systolic function is mildly reduced. Tricuspid regurgitation signal is inadequate for assessing PA pressure. Left Atrium: Left atrial size was normal in size. Right Atrium: Right atrial size was normal in size. Pericardium: Trivial pericardial effusion is present. Mitral Valve: The mitral valve is normal in structure. Trivial mitral valve regurgitation. No evidence of mitral valve stenosis. Tricuspid Valve: The tricuspid valve is normal in structure. Tricuspid valve regurgitation is trivial. No evidence of tricuspid stenosis. Aortic Valve: The aortic valve is tricuspid. Aortic valve regurgitation is not  visualized. No aortic stenosis is present. Aortic valve mean gradient measures 7.0 mmHg. Aortic valve peak gradient measures 12.4 mmHg. Aortic valve area, by VTI measures 2.76  cm. Pulmonic Valve: The pulmonic valve was not well visualized. Pulmonic valve regurgitation is not visualized. No evidence of pulmonic stenosis. Aorta: The aortic root and ascending aorta are structurally normal, with no evidence of dilitation. Venous: The inferior vena cava is normal in size with greater than 50% respiratory variability, suggesting right atrial pressure of 3 mmHg. IAS/Shunts: The interatrial septum was not well visualized.  LEFT VENTRICLE PLAX 2D LVIDd:         5.40 cm      Diastology LVIDs:         4.63 cm      LV e' medial:    7.00 cm/s LV PW:         1.04 cm      LV E/e' medial:  10.9 LV IVS:        1.05 cm      LV e' lateral:   6.00 cm/s LVOT diam:     2.40 cm      LV E/e' lateral: 12.7 LV SV:         113 LV SV Index:   56 LVOT Area:     4.52 cm  LV Volumes (MOD) LV vol d, MOD A2C: 134.0 ml LV vol d, MOD A4C: 122.0 ml LV vol s, MOD A2C: 67.2 ml LV vol s, MOD A4C: 80.5 ml LV SV MOD A2C:     66.8 ml LV SV MOD A4C:     122.0 ml LV SV MOD BP:      56.2 ml RIGHT VENTRICLE            IVC RV S prime:     9.90 cm/s  IVC diam: 1.20 cm TAPSE (M-mode): 2.2 cm LEFT ATRIUM             Index        RIGHT ATRIUM           Index LA diam:        3.40 cm 1.70 cm/m   RA Area:     16.90 cm LA Vol (A2C):   76.4 ml 38.24 ml/m  RA Volume:   46.40 ml  23.22 ml/m LA Vol (A4C):   49.5 ml 24.77 ml/m LA Biplane Vol: 64.1 ml 32.08 ml/m  AORTIC VALVE AV Area (Vmax):    2.97 cm AV Area (Vmean):   2.61 cm AV Area (VTI):     2.76 cm AV Vmax:           176.00 cm/s AV Vmean:  125.000 cm/s AV VTI:            0.409 m AV Peak Grad:      12.4 mmHg AV Mean Grad:      7.0 mmHg LVOT Vmax:         115.60 cm/s LVOT Vmean:        72.140 cm/s LVOT VTI:          0.249 m LVOT/AV VTI ratio: 0.61  AORTA Ao Root diam: 3.20 cm Ao Asc diam:  3.50 cm  MITRAL VALVE MV Area (PHT): 3.38 cm    SHUNTS MV Decel Time: 225 msec    Systemic VTI:  0.25 m MV E velocity: 76.20 cm/s  Systemic Diam: 2.40 cm MV A velocity: 59.45 cm/s MV E/A ratio:  1.28 Sunit Tolia Electronically signed by Olinda Bertrand Signature Date/Time: 10/22/2023/4:33:18 PM    Final    CT Head Wo Contrast Result Date: 10/22/2023 CLINICAL DATA:  Dizziness and fall EXAM: CT HEAD WITHOUT CONTRAST CT CERVICAL SPINE WITHOUT CONTRAST TECHNIQUE: Multidetector CT imaging of the head and cervical spine was performed following the standard protocol without intravenous contrast. Multiplanar CT image reconstructions of the cervical spine were also generated. RADIATION DOSE REDUCTION: This exam was performed according to the departmental dose-optimization program which includes automated exposure control, adjustment of the mA and/or kV according to patient size and/or use of iterative reconstruction technique. COMPARISON:  None Available. FINDINGS: CT HEAD FINDINGS Brain: Old right basal ganglia small vessel infarct. No mass, hemorrhage or extra-axial collection. Vascular: No hyperdense vessel or unexpected vascular calcification. Skull: The visualized skull base, calvarium and extracranial soft tissues are normal. Sinuses/Orbits: No fluid levels or advanced mucosal thickening of the visualized paranasal sinuses. No mastoid or middle ear effusion. Normal orbits. Other: None. CT CERVICAL SPINE FINDINGS Alignment: No static subluxation. Facets are aligned. Occipital condyles are normally positioned. Skull base and vertebrae: No acute fracture. Soft tissues and spinal canal: No prevertebral fluid or swelling. No visible canal hematoma. Disc levels: No advanced spinal canal or neural foraminal stenosis. Upper chest: Right apical opacities. Other: Normal visualized paraspinal cervical soft tissues. IMPRESSION: 1. No acute intracranial abnormality. 2. No acute fracture or static subluxation of the cervical spine. 3. Right  apical pulmonary opacities. Electronically Signed   By: Juanetta Nordmann M.D.   On: 10/22/2023 03:53   CT Cervical Spine Wo Contrast Result Date: 10/22/2023 CLINICAL DATA:  Dizziness and fall EXAM: CT HEAD WITHOUT CONTRAST CT CERVICAL SPINE WITHOUT CONTRAST TECHNIQUE: Multidetector CT imaging of the head and cervical spine was performed following the standard protocol without intravenous contrast. Multiplanar CT image reconstructions of the cervical spine were also generated. RADIATION DOSE REDUCTION: This exam was performed according to the departmental dose-optimization program which includes automated exposure control, adjustment of the mA and/or kV according to patient size and/or use of iterative reconstruction technique. COMPARISON:  None Available. FINDINGS: CT HEAD FINDINGS Brain: Old right basal ganglia small vessel infarct. No mass, hemorrhage or extra-axial collection. Vascular: No hyperdense vessel or unexpected vascular calcification. Skull: The visualized skull base, calvarium and extracranial soft tissues are normal. Sinuses/Orbits: No fluid levels or advanced mucosal thickening of the visualized paranasal sinuses. No mastoid or middle ear effusion. Normal orbits. Other: None. CT CERVICAL SPINE FINDINGS Alignment: No static subluxation. Facets are aligned. Occipital condyles are normally positioned. Skull base and vertebrae: No acute fracture. Soft tissues and spinal canal: No prevertebral fluid or swelling. No visible canal hematoma. Disc levels: No advanced spinal canal or  neural foraminal stenosis. Upper chest: Right apical opacities. Other: Normal visualized paraspinal cervical soft tissues. IMPRESSION: 1. No acute intracranial abnormality. 2. No acute fracture or static subluxation of the cervical spine. 3. Right apical pulmonary opacities. Electronically Signed   By: Juanetta Nordmann M.D.   On: 10/22/2023 03:53   DG Chest 2 View Result Date: 10/21/2023 CLINICAL DATA:  Chest pain. EXAM: CHEST - 2  VIEW COMPARISON:  Nov 10, 2020 FINDINGS: The heart size and mediastinal contours are within normal limits. There is stable right apical pleural fluid versus pleural thickening. No acute infiltrate or pneumothorax is identified. Stable postsurgical changes are seen along the lateral aspect of the mid right hemithorax. No acute osseous abnormalities are identified. IMPRESSION: Stable chronic and postoperative changes without evidence of acute cardiopulmonary disease. Electronically Signed   By: Virgle Grime M.D.   On: 10/21/2023 23:52     Labs:   Basic Metabolic Panel: Recent Labs  Lab 10/21/23 2045 10/23/23 0525 10/24/23 0533 10/24/23 1403 10/24/23 1405 10/25/23 0833  NA 138 137 134* 139 138 136  K 4.4 4.6 4.2 4.2 4.2 4.3  CL 105 103 102  --   --  101  CO2 28 25 22   --   --  25  GLUCOSE 92 95 102*  --   --  94  BUN 53* 42* 35*  --   --  32*  CREATININE 2.53* 2.17* 2.25*  --   --  2.10*  CALCIUM  9.2 8.9 9.1  --   --  9.3  MG 2.4  --   --   --   --   --    GFR Estimated Creatinine Clearance: 36.4 mL/min (A) (by C-G formula based on SCr of 2.1 mg/dL (H)). Liver Function Tests: No results for input(s): "AST", "ALT", "ALKPHOS", "BILITOT", "PROT", "ALBUMIN" in the last 168 hours. No results for input(s): "LIPASE", "AMYLASE" in the last 168 hours. No results for input(s): "AMMONIA" in the last 168 hours. Coagulation profile No results for input(s): "INR", "PROTIME" in the last 168 hours.  CBC: Recent Labs  Lab 10/21/23 2045 10/22/23 0800 10/23/23 0525 10/24/23 0533 10/24/23 1403 10/24/23 1405 10/25/23 0833  WBC 7.0 5.5 4.2 4.7  --   --  4.2  HGB 10.0* 9.8* 10.4* 11.2* 10.5* 10.2* 11.0*  HCT 32.0* 30.8* 33.5* 36.6* 31.0* 30.0* 34.8*  MCV 88.9 88.5 89.8 90.4  --   --  87.7  PLT 212 190 179 212  --   --  222   Cardiac Enzymes: No results for input(s): "CKTOTAL", "CKMB", "CKMBINDEX", "TROPONINI" in the last 168 hours. BNP: Invalid input(s): "POCBNP" CBG: No results for  input(s): "GLUCAP" in the last 168 hours. D-Dimer No results for input(s): "DDIMER" in the last 72 hours. Hgb A1c No results for input(s): "HGBA1C" in the last 72 hours. Lipid Profile No results for input(s): "CHOL", "HDL", "LDLCALC", "TRIG", "CHOLHDL", "LDLDIRECT" in the last 72 hours. Thyroid function studies Recent Labs    10/24/23 0909  TSH 2.306   Anemia work up No results for input(s): "VITAMINB12", "FOLATE", "FERRITIN", "TIBC", "IRON", "RETICCTPCT" in the last 72 hours. Microbiology No results found for this or any previous visit (from the past 240 hours).  Time coordinating discharge: 45 minutes  Signed: Codi Folkerts  [facility] 10/25/2023, 2:18 PM

## 2023-10-25 NOTE — Progress Notes (Addendum)
 PHARMACY - ANTICOAGULATION CONSULT NOTE  Pharmacy Consult for Heparin  Indication:  NSTEMI + afib/flutter  Allergies  Allergen Reactions   Cefaclor Other (See Comments)    Hallucinations   Other Other (See Comments)    Deconamine was a brand name medication that contains two different drugs, an antihistamine (chlorpheniramine) and a decongestant (pseudoephedrine) = Hallucinations    Patient Measurements: Height: 6' (182.9 cm) Weight: 78 kg (171 lb 15.3 oz) IBW/kg (Calculated) : 77.6 HEPARIN  DW (KG): 78  Vital Signs: Temp: 97.7 F (36.5 C) (04/25 0532) Temp Source: Oral (04/25 0532) BP: 140/76 (04/25 0532) Pulse Rate: 55 (04/25 0532)  Labs: Recent Labs    10/23/23 0525 10/24/23 0533 10/24/23 1403 10/24/23 1405 10/25/23 0833  HGB 10.4* 11.2* 10.5* 10.2* 11.0*  HCT 33.5* 36.6* 31.0* 30.0* 34.8*  PLT 179 212  --   --  222  HEPARINUNFRC 0.51 0.64  --   --  0.43  CREATININE 2.17* 2.25*  --   --  2.10*    Estimated Creatinine Clearance: 36.4 mL/min (A) (by C-G formula based on SCr of 2.1 mg/dL (H)).  Assessment: AC/Heme: IV hep initially for NSTEMI > possible aflutter on 4/23.  10/25/2023 - Hep level 0.43 in goal range after heparin  resumed post cath last night - 4/24: Cath: mild CAD CBC stable, no bleeding reported  Goal of Therapy:  Heparin  level 0.3-0.7 units/ml Monitor platelets by anticoagulation protocol: Yes   Plan:  continue heparin  at 950 units/hr  Daily HL and CBC F/u for transition to DOAC   Rubie Corona, Pharm.D Use secure chat for questions 10/25/2023 9:41 AM   Addendum: Transition to Eliquis  5 mg po bid. Will educate pt & supply 30 day free card & $10 copay card  Rubie Corona, Pharm.D Use secure chat for questions 10/25/2023 12:19 PM

## 2023-10-25 NOTE — Discharge Instructions (Addendum)

## 2023-10-25 NOTE — Progress Notes (Signed)
 Notified Cardiology MD Alda Amas of patient having episode of dizziness/lightheadedness while walking in his room to the bathroom. There was no associated chest pain, resolved after pt sat down and used the bathroom. Obtained orthostatic VS after, which were negative.   Shortly after patient ambulated with me in the hallway to nurses station and back with no SOB/CP or dizziness.

## 2023-10-25 NOTE — Progress Notes (Signed)
 AVS reviewed w/ pt who verbalized an understanding - pt to buy a new BP cuff, all questions answered. PIV removed as noted- Cath site to R arm CDI w/ gauze and opsite. Pt dressed for d/c - son enroute

## 2023-10-29 LAB — CBC
Hematocrit: 31.5 % — ABNORMAL LOW (ref 37.5–51.0)
Hemoglobin: 9.8 g/dL — ABNORMAL LOW (ref 13.0–17.7)
MCH: 27.5 pg (ref 26.6–33.0)
MCHC: 31.1 g/dL — ABNORMAL LOW (ref 31.5–35.7)
MCV: 89 fL (ref 79–97)
Platelets: 197 10*3/uL (ref 150–450)
RBC: 3.56 x10E6/uL — ABNORMAL LOW (ref 4.14–5.80)
RDW: 14.6 % (ref 11.6–15.4)
WBC: 4.7 10*3/uL (ref 3.4–10.8)

## 2023-11-04 NOTE — Progress Notes (Deleted)
 Cardiology Office Note:  .   Date:  11/04/2023  ID:  Terry Helper Sr., DOB [birth year], MRN [id number] PCP: Roselind Congo, MD  [facility] Providers Cardiologist:  Ahmad Alert, MD Electrophysiologist:  Will Cortland Ding, MD { Click to update primary MD,subspecialty MD or APP then REFRESH:1}   History of Present Illness: .   Terry R Calixto Vasilopoulos. is a 68 y.o. male ith a hx of heart failure with improved EF (25 to 30% in 2016 improved to 50 to 55% in 2019), nonsustained ventricular tachycardia, hypertension, CKD.   10/22/2023 patient had an NSTEMI and Bradycardia, echo showed EF 30-35% LHC/RHC nonobstructive CAD NSTEMI felt to be demand ischemia from arrhythmia.  Paroxysmal atrial flutter with RVR started on amiodarone  and eliquis . No BB due to bradycardia.  ROS: ***  Studies Reviewed: Aaron Aas         Prior CV Studies: RIGHT/LEFT HEART CATH AND CORONARY ANGIOGRAPHY 10/24/2023  Conclusion   Prox RCA lesion is 30% stenosed.   2nd Mrg lesion is 40% stenosed.   Prox LAD to Mid LAD lesion is 30% stenosed.   Prox Cx to Dist Cx lesion is 20% stenosed.  1.  Mild nonobstructive coronary artery disease. 2.  Left ventricular angiography was not performed due to chronic kidney disease.  EF was moderately reduced by echo. 3.  Right heart catheterization showed normal right and left-sided filling pressures, mild pulmonary hypertension and normal cardiac output  RA: 8 mmHg, RV: 39/2 mmHg, PW: 10 mmHg, PA: 39/10 mmHg.  Cardiac output is 5.63 with an index of 2.82.  Recommendations: No clear culprit is identified for elevated troponin which is likely due to supply demand ischemia.  Possible underlying arrhythmia. Heparin  drip can be resumed 2 hours after TR band removal and a DOAC can be started tomorrow if needed. Only 15 mL of contrast was used for the procedure.   ECHO COMPLETE WITH IMAGING ENHANCING AGENT 10/22/2023  Narrative ECHOCARDIOGRAM REPORT    Patient Name:   Terry BASSETTI Sr. Date of Exam: 10/22/2023 Medical Rec #:  [id number]          Height:       72.0 in Accession #:    [id number]         Weight:       172.0 lb Date of Birth:  [birth year]          BSA:          1.998 m Patient Age:    68 years           BP:           102/60 mmHg Patient Gender: M                  HR:           44 bpm. Exam Location:  Inpatient  Procedure: 2D Echo, Cardiac Doppler, Color Doppler and Intracardiac Opacification Agent (Both Spectral and Color Flow Doppler were utilized during procedure).  Indications:    122-I22.9 Subsequent ST elevation (STEM) and non-ST elevation (NSTEMI) myocardial infarction  History:        Patient has prior history of Echocardiogram examinations, most recent 01/13/2018. CHF, Previous Myocardial Infarction, Abnormal ECG; Risk Factors:Hypertension and Dyslipidemia. Cancer.  Sonographer:    Raynelle Callow RDCS Referring Phys: [id number] ROBERT DORRELL  IMPRESSIONS   1. Left ventricular ejection fraction, by estimation, is 30 to 35%. The left ventricle has moderately decreased function. The left ventricle demonstrates regional  wall motion abnormalities (see scoring diagram/findings for description). Left ventricular diastolic parameters are consistent with Grade II diastolic dysfunction (pseudonormalization). The inferior septum, entire inferior wall, apical anterior segment, and apex are hypokinetic. 2. Right ventricular systolic function is mildly reduced. The right ventricular size is mildly enlarged. Tricuspid regurgitation signal is inadequate for assessing PA pressure. 3. The mitral valve is normal in structure. Trivial mitral valve regurgitation. No evidence of mitral stenosis. 4. The aortic valve is tricuspid. Aortic valve regurgitation is not visualized. No aortic stenosis is present. 5. The inferior vena cava is normal in size with greater than 50% respiratory variability, suggesting right atrial pressure of 3  mmHg.  Conclusion(s)/Recommendation(s): No left ventricular mural or apical thrombus/thrombi.  FINDINGS Left Ventricle: Left ventricular ejection fraction, by estimation, is 30 to 35%. The left ventricle has moderately decreased function. The left ventricle demonstrates regional wall motion abnormalities. Definity  contrast agent was given IV to delineate the left ventricular endocardial borders. The left ventricular internal cavity size was normal in size. There is no left ventricular hypertrophy. Left ventricular diastolic parameters are consistent with Grade II diastolic dysfunction (pseudonormalization).   LV Wall Scoring: The inferior septum, entire inferior wall, apical anterior segment, and apex are hypokinetic. The inferior septum, entire inferior wall, apical anterior segment, and apex are hypokinetic.  Right Ventricle: The right ventricular size is mildly enlarged. Right vetricular wall thickness was not well visualized. Right ventricular systolic function is mildly reduced. Tricuspid regurgitation signal is inadequate for assessing PA pressure.  Left Atrium: Left atrial size was normal in size.  Right Atrium: Right atrial size was normal in size.  Pericardium: Trivial pericardial effusion is present.  Mitral Valve: The mitral valve is normal in structure. Trivial mitral valve regurgitation. No evidence of mitral valve stenosis.  Tricuspid Valve: The tricuspid valve is normal in structure. Tricuspid valve regurgitation is trivial. No evidence of tricuspid stenosis.  Aortic Valve: The aortic valve is tricuspid. Aortic valve regurgitation is not visualized. No aortic stenosis is present. Aortic valve mean gradient measures 7.0 mmHg. Aortic valve peak gradient measures 12.4 mmHg. Aortic valve area, by VTI measures 2.76 cm.  Pulmonic Valve: The pulmonic valve was not well visualized. Pulmonic valve regurgitation is not visualized. No evidence of pulmonic stenosis.  Aorta: The  aortic root and ascending aorta are structurally normal, with no evidence of dilitation.  Venous: The inferior vena cava is normal in size with greater than 50% respiratory variability, suggesting right atrial pressure of 3 mmHg.  IAS/Shunts: The interatrial septum was not well visualized.   LEFT VENTRICLE PLAX 2D LVIDd:         5.40 cm      Diastology LVIDs:         4.63 cm      LV e' medial:    7.00 cm/s LV PW:         1.04 cm      LV E/e' medial:  10.9 LV IVS:        1.05 cm      LV e' lateral:   6.00 cm/s LVOT diam:     2.40 cm      LV E/e' lateral: 12.7 LV SV:         113 LV SV Index:   56 LVOT Area:     4.52 cm  LV Volumes (MOD) LV vol d, MOD A2C: 134.0 ml LV vol d, MOD A4C: 122.0 ml LV vol s, MOD A2C: 67.2 ml LV vol s, MOD A4C:  80.5 ml LV SV MOD A2C:     66.8 ml LV SV MOD A4C:     122.0 ml LV SV MOD BP:      56.2 ml  RIGHT VENTRICLE            IVC RV S prime:     9.90 cm/s  IVC diam: 1.20 cm TAPSE (M-mode): 2.2 cm  LEFT ATRIUM             Index        RIGHT ATRIUM           Index LA diam:        3.40 cm 1.70 cm/m   RA Area:     16.90 cm LA Vol (A2C):   76.4 ml 38.24 ml/m  RA Volume:   46.40 ml  23.22 ml/m LA Vol (A4C):   49.5 ml 24.77 ml/m LA Biplane Vol: 64.1 ml 32.08 ml/m AORTIC VALVE AV Area (Vmax):    2.97 cm AV Area (Vmean):   2.61 cm AV Area (VTI):     2.76 cm AV Vmax:           176.00 cm/s AV Vmean:          125.000 cm/s AV VTI:            0.409 m AV Peak Grad:      12.4 mmHg AV Mean Grad:      7.0 mmHg LVOT Vmax:         115.60 cm/s LVOT Vmean:        72.140 cm/s LVOT VTI:          0.249 m LVOT/AV VTI ratio: 0.61  AORTA Ao Root diam: 3.20 cm Ao Asc diam:  3.50 cm  MITRAL VALVE MV Area (PHT): 3.38 cm    SHUNTS MV Decel Time: 225 msec    Systemic VTI:  0.25 m MV E velocity: 76.20 cm/s  Systemic Diam: 2.40 cm MV A velocity: 59.45 cm/s MV E/A ratio:  1.28  Sunit Tolia Electronically signed by Olinda Bertrand Signature Date/Time:  10/22/2023/4:33:18 PM    Final      Risk Assessment/Calculations:   {Does this patient have ATRIAL FIBRILLATION?:[phone]} No BP recorded.  {Refresh Note OR Click here to enter BP  :1}***       Physical Exam:   VS:  There were no vitals taken for this visit.   Wt Readings from Last 3 Encounters:  10/21/23 171 lb 15.3 oz (78 kg)  09/27/23 172 lb 3.2 oz (78.1 kg)  03/08/22 178 lb 3.2 oz (80.8 kg)    GEN: Well nourished, well developed in no acute distress NECK: No JVD; No carotid bruits CARDIAC: ***RRR, no murmurs, rubs, gallops RESPIRATORY:  Clear to auscultation without rales, wheezing or rhonchi  ABDOMEN: Soft, non-tender, non-distended EXTREMITIES:  No edema; No deformity   ASSESSMENT AND PLAN: .     CAD NSTEMI 10/2023 LHC/RHC on 4/24 showed mild nonobstructive CAD, normal filling pressures and cardiac output (RA 8, PCWP 10, CI 2.8).  Suspect demand ischemia, could be due to arrhythmia. No BB due to bradycardia and near syncope.  HFrEF 30-35% on echo 10/2023 History of heart failure with reduced EF -- remote history of a pleurisy on his right lung for pneumonia in 1993. echo on 08/2014 showed an LVEF of 25 to 30%, improved to 50 to 55% in 2019. --TTE on 10/22/22 showed LVEF of 35%, hypokinesis of the inferior septum, entire inferior wall, apical anterior segment,  and apex , grade 2 diastolic dysfunction, RV systolic function is mildly reduced and is mildly enlarged .Not a good candidate for SGLT2 or MRI with CKD . --  Stop amlodipine . May no longer take Cialis . -- Received 1 dose of Imdur  30 mg daily and reported headache so was discontinued.  Will restart at 15 mg daily -- Continue hydralizine 25 mg TID  PAF with RVR spontaneously converted to NSR now on amiodarone  and Eliquis .  Near syncope/bradycardia/bigeminy/trigeminy off BB  HTN-amlodipine  stopped, losartan  held due to renal function, hydrochlorothiazide  stopped and started on Imdur  and hydralazine   HLD  CKD  3b/IV      {Are you ordering a CV Procedure (e.g. stress test, cath, DCCV, TEE, etc)?   Press F2        :[id number]}  Dispo: ***  Signed, Theotis Flake, PA-C

## 2023-11-12 ENCOUNTER — Ambulatory Visit: Admitting: Physician Assistant

## 2023-11-15 ENCOUNTER — Observation Stay (HOSPITAL_COMMUNITY)
Admission: EM | Admit: 2023-11-15 | Discharge: 2023-11-17 | Disposition: A | Discharge status: Home / Self Care | Source: Other Acute Inpatient Hospital | Attending: Internal Medicine | Admitting: Internal Medicine

## 2023-11-15 ENCOUNTER — Encounter (HOSPITAL_COMMUNITY): Payer: Self-pay

## 2023-11-15 DIAGNOSIS — Z7901 Long term (current) use of anticoagulants: Secondary | ICD-10-CM | POA: Diagnosis not present

## 2023-11-15 DIAGNOSIS — R001 Bradycardia, unspecified: Secondary | ICD-10-CM | POA: Diagnosis present

## 2023-11-15 DIAGNOSIS — I5022 Chronic systolic (congestive) heart failure: Secondary | ICD-10-CM | POA: Diagnosis not present

## 2023-11-15 DIAGNOSIS — I38 Endocarditis, valve unspecified: Secondary | ICD-10-CM | POA: Diagnosis present

## 2023-11-15 DIAGNOSIS — Z8546 Personal history of malignant neoplasm of prostate: Secondary | ICD-10-CM | POA: Diagnosis not present

## 2023-11-15 DIAGNOSIS — Z79899 Other long term (current) drug therapy: Secondary | ICD-10-CM | POA: Diagnosis not present

## 2023-11-15 DIAGNOSIS — I429 Cardiomyopathy, unspecified: Secondary | ICD-10-CM | POA: Insufficient documentation

## 2023-11-15 DIAGNOSIS — I2510 Atherosclerotic heart disease of native coronary artery without angina pectoris: Secondary | ICD-10-CM | POA: Diagnosis not present

## 2023-11-15 DIAGNOSIS — N1832 Chronic kidney disease, stage 3b: Secondary | ICD-10-CM | POA: Insufficient documentation

## 2023-11-15 DIAGNOSIS — I130 Hypertensive heart and chronic kidney disease with heart failure and stage 1 through stage 4 chronic kidney disease, or unspecified chronic kidney disease: Secondary | ICD-10-CM | POA: Insufficient documentation

## 2023-11-15 DIAGNOSIS — I4892 Unspecified atrial flutter: Secondary | ICD-10-CM | POA: Insufficient documentation

## 2023-11-16 DIAGNOSIS — N1832 Chronic kidney disease, stage 3b: Secondary | ICD-10-CM

## 2023-11-16 DIAGNOSIS — I10 Essential (primary) hypertension: Secondary | ICD-10-CM

## 2023-11-16 DIAGNOSIS — R001 Bradycardia, unspecified: Secondary | ICD-10-CM | POA: Diagnosis not present

## 2023-11-16 DIAGNOSIS — I130 Hypertensive heart and chronic kidney disease with heart failure and stage 1 through stage 4 chronic kidney disease, or unspecified chronic kidney disease: Secondary | ICD-10-CM | POA: Diagnosis not present

## 2023-11-16 DIAGNOSIS — I429 Cardiomyopathy, unspecified: Secondary | ICD-10-CM

## 2023-11-16 DIAGNOSIS — I4892 Unspecified atrial flutter: Secondary | ICD-10-CM

## 2023-11-16 DIAGNOSIS — I5022 Chronic systolic (congestive) heart failure: Secondary | ICD-10-CM | POA: Diagnosis not present

## 2023-11-16 DIAGNOSIS — Z8546 Personal history of malignant neoplasm of prostate: Secondary | ICD-10-CM | POA: Diagnosis not present

## 2023-11-16 LAB — BASIC METABOLIC PANEL WITH GFR
Anion gap: 6 (ref 5–15)
BUN: 25 mg/dL — ABNORMAL HIGH (ref 8–23)
CO2: 26 mmol/L (ref 22–32)
Calcium: 9.3 mg/dL (ref 8.9–10.3)
Chloride: 106 mmol/L (ref 98–111)
Creatinine, Ser: 2.5 mg/dL — ABNORMAL HIGH (ref 0.61–1.24)
GFR, Estimated: 27 mL/min — ABNORMAL LOW (ref >60)
Glucose, Bld: 96 mg/dL (ref 70–99)
Potassium: 4.5 mmol/L (ref 3.5–5.1)
Sodium: 138 mmol/L (ref 135–145)

## 2023-11-16 LAB — CBC
HCT: 31.8 % — ABNORMAL LOW (ref 39.0–52.0)
Hemoglobin: 10.2 g/dL — ABNORMAL LOW (ref 13.0–17.0)
MCH: 28.1 pg (ref 26.0–34.0)
MCHC: 32.1 g/dL (ref 30.0–36.0)
MCV: 87.6 fL (ref 80.0–100.0)
Platelets: 219 10*3/uL (ref 150–400)
RBC: 3.63 MIL/uL — ABNORMAL LOW (ref 4.22–5.81)
RDW: 14.4 % (ref 11.5–15.5)
WBC: 3.7 10*3/uL — ABNORMAL LOW (ref 4.0–10.5)
nRBC: 0 % (ref 0.0–0.2)

## 2023-11-16 LAB — HEPARIN LEVEL (UNFRACTIONATED): Heparin Unfractionated: >1.1 [IU]/mL — ABNORMAL HIGH (ref 0.30–0.70)

## 2023-11-16 LAB — APTT
aPTT: 35 s (ref 24–36)
aPTT: 73 s — ABNORMAL HIGH (ref 24–36)

## 2023-11-16 LAB — HIV ANTIBODY (ROUTINE TESTING W REFLEX): HIV Screen 4th Generation wRfx: NONREACTIVE

## 2023-11-16 LAB — PROTIME-INR
INR: 1.4 — ABNORMAL HIGH (ref 0.8–1.2)
Prothrombin Time: 17.1 s — ABNORMAL HIGH (ref 11.4–15.2)

## 2023-11-16 MED ORDER — COLCHICINE 0.6 MG PO TABS: 0.6000 mg | ORAL_TABLET | Freq: Two times a day (BID) | ORAL | Status: DC | PRN

## 2023-11-16 MED ORDER — ATORVASTATIN CALCIUM 40 MG PO TABS
40.0000 mg | ORAL_TABLET | Freq: Every day | ORAL | Status: DC
  Administered 2023-11-16 – 2023-11-17 (×2): 40 mg via ORAL
  Filled 2023-11-16 (×2): qty 1

## 2023-11-16 MED ORDER — HYDRALAZINE HCL 25 MG PO TABS
25.0000 mg | ORAL_TABLET | Freq: Three times a day (TID) | ORAL | Status: DC
  Administered 2023-11-16 – 2023-11-17 (×4): 25 mg via ORAL
  Filled 2023-11-16 (×4): qty 1

## 2023-11-16 MED ORDER — VITAMIN D 25 MCG (1000 UNIT) PO TABS
2000.0000 [IU] | ORAL_TABLET | Freq: Every day | ORAL | Status: DC
  Administered 2023-11-16 – 2023-11-17 (×2): 2000 [IU] via ORAL
  Filled 2023-11-16 (×2): qty 2

## 2023-11-16 MED ORDER — HEPARIN (PORCINE) 25000 UT/250ML-% IV SOLN
950.0000 [IU]/h | INTRAVENOUS | Status: DC
  Administered 2023-11-16 – 2023-11-17 (×2): 950 [IU]/h via INTRAVENOUS
  Filled 2023-11-16 (×2): qty 250

## 2023-11-16 MED ORDER — ISOSORBIDE MONONITRATE ER 30 MG PO TB24
15.0000 mg | ORAL_TABLET | Freq: Every day | ORAL | Status: DC
  Administered 2023-11-16: 15 mg via ORAL
  Filled 2023-11-16: qty 1

## 2023-11-16 NOTE — H&P (Addendum)
 Cardiology Admission History and Physical   Patient ID: Terry Helper Sr. MRN: [id number]; DOB: 08-18-1955   Admission date: 11/15/2023  PCP:  Roselind Congo, MD   [facility] Providers Cardiologist:  Ahmad Alert, MD  Electrophysiologist:  Lei Pump, MD       Chief Complaint:  Lightheadedness  Patient Profile:   Terry Helper Sr. is a 68 y.o. male with a significant past medical history of heart failure with reduced ejection fraction, non-obstructive coronary artery disease, hypertension, stage IIIb chronic kidney disease, prostate cancer status post prostectomy, and remote history of cigarette use who is being seen 11/16/2023 for the evaluation of possible symptomatic bradycardia.  History of Present Illness:   Terry Moss reports earlier yesterday afternoon he became concerned with his blood pressure being elevated for which it went fro 156/77 mmHg to 200/94 mmHg.  He noticed some lightheadedness, feeling hot and sweaty, with his blood pressure but denies dizziness or feeling like he was going to faint.  He also denied chest pain, shortness of breath, or palpitations.  Due to his blood pressure being elevated, he decided to drive himself to [facility] for further evaluation.  At this time, patient denies any symptoms and states that he has been able to walk to the restroom without any issues.  In the emergency department, his initial blood pressure was noted at 216/97 with a heart rate of 60 bpm.  When patient was evaluated at [facility], it was reported that he had a heart rate in the 30s to 40s.  He was evaluated by cardiology and per note, concern for symptomatic bradycardia.  His amiodarone  was held.  He was transferred to our facility for the consideration of CRT-D given heart failure with reduced ejection fraction.    Of note, Terry Moss was recently hospitalized at our hospitalized at our facility from 10/21/23  to 10/20/23 due to concern for NSTEMI-ACS.  He presented with chest pain and elevated HS-troponin.  Patient did report pre-syncope and fall at home as part of his symptoms with chest pain at the time.  Coronary angiography revealed mild non-obstructive coronary artery disease.  Echocardiogram performed demonstrated left ventricular EF of 30%-35%.  Of note, his hospital course was complicated with atrial flutter with rapid ventricular rate.  He spontaneously converted to sinus rhythm while getting IV amiodarone  drip.  He was discharged home with amiodarone  by mouth.     Past Medical History:  Diagnosis Date   Abnormal kidney function    sees [place] kidney x 2    Anemia    Cancer (HCC) dx dec 2019   prostate   Cardiomegaly    Chronic kidney disease    Dysrhythmia    Hypertension    Pneumonia 1993   Sinus complaint     Past Surgical History:  Procedure Laterality Date   ELBOW SURGERY Right 05/2018   PELVIC LYMPH NODE DISSECTION Bilateral 11/14/2018   Procedure: PELVIC LYMPH NODE DISSECTION;  Surgeon: Andrez Banker, MD;  Location: [facility];  Service: Urology;  Laterality: Bilateral;   pleurisy  1993   Right lung surgery    PROSTATE BIOPSY     RIGHT/LEFT HEART CATH AND CORONARY ANGIOGRAPHY N/A 10/24/2023   Procedure: RIGHT/LEFT HEART CATH AND CORONARY ANGIOGRAPHY;  Surgeon: Wenona Hamilton, MD;  Location: [facility] INVASIVE CV LAB;  Service: Cardiovascular;  Laterality: N/A;   ROBOT ASSISTED LAPAROSCOPIC RADICAL PROSTATECTOMY N/A 11/14/2018   Procedure: XI ROBOTIC ASSISTED  LAPAROSCOPIC RADICAL PROSTATECTOMY;  Surgeon: Andrez Banker, MD;  Location: [facility];  Service: Urology;  Laterality: N/A;     Medications Prior to Admission: Prior to Admission medications   Medication Sig Start Date End Date Taking? Authorizing Provider  amiodarone  (PACERONE ) 200 MG tablet Take 1 tablet (200 mg total) by mouth 2 (two) times daily for 14 days, THEN 1 tablet (200 mg total) daily. 10/25/23 12/08/23   Hoyt Macleod, MD  apixaban  (ELIQUIS ) 5 MG TABS tablet Take 1 tablet (5 mg total) by mouth 2 (two) times daily. 10/25/23 11/24/23  Hoyt Macleod, MD  atorvastatin  (LIPITOR) 40 MG tablet Take 1 tablet (40 mg total) by mouth daily. 10/26/23   Hoyt Macleod, MD  Cholecalciferol (VITAMIN D3) 50 MCG (2000 UT) TABS Take 2,000 Units by mouth daily after breakfast.    [provider]  colchicine  0.6 MG tablet Take 0.6 mg by mouth 2 (two) times daily as needed (for gout flares).    [provider]  hydrALAZINE  (APRESOLINE ) 25 MG tablet Take 1 tablet (25 mg total) by mouth every 8 (eight) hours. 10/25/23   Hoyt Macleod, MD  isosorbide  mononitrate (IMDUR ) 30 MG 24 hr tablet Take 0.5 tablets (15 mg total) by mouth daily. 10/26/23   Hoyt Macleod, MD  nitroGLYCERIN  (NITROSTAT ) 0.4 MG SL tablet Place 1 tablet (0.4 mg total) under the tongue every 5 (five) minutes x 3 doses as needed for chest pain. 10/25/23   Hoyt Macleod, MD  UNKNOWN TO PATIENT Place 1 drop into both eyes See admin instructions. Unnamed OTC eye drops (name possibly starts with "Med") for redness/dryness/itching : Instill 1 drop into both eyes up to three times a day as needed for dryness or irritation    [provider]     Allergies:    Allergies  Allergen Reactions   Cefaclor Other (See Comments)    Hallucinations   Other Other (See Comments)    Deconamine was a brand name medication that contains two different drugs, an antihistamine (chlorpheniramine) and a decongestant (pseudoephedrine) = Hallucinations    Social History:   Social History   Socioeconomic History   Marital status: Divorced    Spouse name: Not on file   Number of children: 2   Years of education: Not on file   Highest education level: Not on file  Occupational History   Occupation: filter specialist  Tobacco Use   Smoking status: Never   Smokeless tobacco: Never  Vaping Use   Vaping status: Never Used  Substance and Sexual Activity    Alcohol use: Yes    Comment: occ wine   Drug use: No   Sexual activity: Not on file  Other Topics Concern   Not on file  Social History Narrative   Not on file   Social Drivers of Health   Financial Resource Strain: Not on file  Food Insecurity: No Food Insecurity (11/15/2023)   Hunger Vital Sign    Worried About Running Out of Food in the Last Year: Never true    Ran Out of Food in the Last Year: Never true  Transportation Needs: No Transportation Needs (11/15/2023)   PRAPARE - Administrator, Civil Service (Medical): No    Lack of Transportation (Non-Medical): No  Physical Activity: Not on file  Stress: Not on file  Social Connections: Moderately Integrated (11/15/2023)   Social Connection and Isolation Panel [NHANES]    Frequency of Communication with Friends and Family: More than three times a  week    Frequency of Social Gatherings with Friends and Family: Twice a week    Attends Religious Services: More than 4 times per year    Active Member of [employer] or Organizations: Yes    Attends Banker Meetings: 1 to 4 times per year    Marital Status: Divorced  Intimate Partner Violence: Not At Risk (11/15/2023)   Humiliation, Afraid, Rape, and Kick questionnaire    Fear of Current or Ex-Partner: No    Emotionally Abused: No    Physically Abused: No    Sexually Abused: No    Family History:   The patient's family history includes Heart attack in his brother; Heart disease in his brother, father, and sister; Hypertension in his mother; Stroke in his brother, father, and mother.    ROS:  Please see the history of present illness.  All other ROS reviewed and negative.     Physical Exam/Data:   Vitals:   11/15/23 2157 11/15/23 2215 11/15/23 2304 11/15/23 2325  BP: (!) 184/82 (!) 184/82 (!) 152/73 (!) 167/77  Pulse: (!) 44 (!) 44 (!) 39 (!) 45  Resp:  18  15  Temp:  98.3 F (36.8 C)  97.6 F (36.4 C)  TempSrc:  Oral  Oral  SpO2: 100% 100% 100% 100%   Weight:      Height:       No intake or output data in the 24 hours ending 11/16/23 0133    11/15/2023    9:23 PM 10/21/2023    9:00 PM 09/27/2023    3:27 PM  Last 3 Weights  Weight (lbs) 172 lb 6.4 oz 171 lb 15.3 oz 172 lb 3.2 oz  Weight (kg) 78.2 kg 78 kg 78.109 kg     Body mass index is 23.38 kg/m.  General:  Well nourished, well developed, in no acute distress HEENT: normal Neck: no JVD Vascular: No carotid bruits; Distal pulses 2+ bilaterally   Cardiac:  normal S1, S2; RRR; 1/6 holosystolic murmur best RUSB Lungs:  clear to auscultation bilaterally, no wheezing, rhonchi or rales  Abd: soft, nontender, no hepatomegaly  Ext: no edema Musculoskeletal:  No deformities, BUE and BLE strength normal and equal Skin: warm and dry  Neuro:  CNs 2-12 intact, no focal abnormalities noted Psych:  Normal affect    EKG:  The ECG that  is pending.  Telemetry reviewed and demonstrates sinus bradycardia with paroxysmal atrial complexes.     Relevant CV Studies: # Coronary angiography with left and right heart catheterization:   Prox RCA lesion is 30% stenosed.   2nd Mrg lesion is 40% stenosed.   Prox LAD to Mid LAD lesion is 30% stenosed.   Prox Cx to Dist Cx lesion is 20% stenosed. 1.  Mild nonobstructive coronary artery disease. 2.  Left ventricular angiography was not performed due to chronic kidney disease.  EF was moderately reduced by echo. 3.  Right heart catheterization showed normal right and left-sided filling pressures, mild pulmonary hypertension and normal cardiac output    # Echocardiogram 10/22/23: IMPRESSIONS   1. Left ventricular ejection fraction, by estimation, is 30 to 35%. The  left ventricle has moderately decreased function. The left ventricle  demonstrates regional wall motion abnormalities (see scoring  diagram/findings for description). Left ventricular   diastolic parameters are consistent with Grade II diastolic dysfunction  (pseudonormalization). The  inferior septum, entire inferior wall, apical  anterior segment, and apex are hypokinetic.   2. Right ventricular systolic function is  mildly reduced. The right  ventricular size is mildly enlarged. Tricuspid regurgitation signal is  inadequate for assessing PA pressure.   3. The mitral valve is normal in structure. Trivial mitral valve  regurgitation. No evidence of mitral stenosis.   4. The aortic valve is tricuspid. Aortic valve regurgitation is not  visualized. No aortic stenosis is present.   5. The inferior vena cava is normal in size with greater than 50%  respiratory variability, suggesting right atrial pressure of 3 mmHg.     Laboratory Data:  High Sensitivity Troponin:   Recent Labs  Lab 10/21/23 2045 10/21/23 2246 10/22/23 0052 10/22/23 0254  TROPONINIHS 130* 190* 252* 244*      ChemistryNo results for input(s): "NA", "K", "CL", "CO2", "GLUCOSE", "BUN", "CREATININE", "CALCIUM ", "MG", "GFRNONAA", "GFRAA", "ANIONGAP" in the last 168 hours.  No results for input(s): "PROT", "ALBUMIN", "AST", "ALT", "ALKPHOS", "BILITOT" in the last 168 hours. Lipids No results for input(s): "CHOL", "TRIG", "HDL", "LABVLDL", "LDLCALC", "CHOLHDL" in the last 168 hours. HematologyNo results for input(s): "WBC", "RBC", "HGB", "HCT", "MCV", "MCH", "MCHC", "RDW", "PLT" in the last 168 hours. Thyroid No results for input(s): "TSH", "FREET4" in the last 168 hours. BNPNo results for input(s): "BNP", "PROBNP" in the last 168 hours.  DDimer No results for input(s): "DDIMER" in the last 168 hours.   Radiology/Studies:  No results found.   Assessment and Plan:   Bradycardia: Patient with noted bradycardia in the setting of lightheadedness.  Symptoms of lightheadedness could have also been related to is hypertension at home.  Possible reversible etiology of bradycardia in the setting of amiodarone  use.  If patient is unable to tolerate beta-blockade independent of amiodarone  wash out, which he needs  for heart failure management, meets an indication for pacemaker (defibrillator function given heart failure).  Currently asymptomatic with heart ranging from 50 bpm to 60 bpm.   --Continue to holding amiodarone  at this time. --Electrophysiology consult in the morning for further assessment.    Cardiomyopathy: Likely multifactorial in the setting of hypertension and possible stress-induced cardiomyopathy given myocardial infarction with non-obstructive coronary artery disease.  Patient without symptoms of acute heart failure syndrome and no hemodynamic congestion on examination.  Echocardiogram done in April with EF reported as 30%-35%.  Patient was only placed on isosorbide  mononitrate and hydralazine  when recently hospitalized, presumably due to renal failure. --Restart home isosorbide  mononitrate and hydralazine . --Given concern for possible symptomatic bradycardia, hold on starting beta-blocker at this time.    Hypertension: Uncontrolled hypertension now complicated with renal failure and cardiomyopathy.  Blood pressure is currently uncontrolled, possibly due to missed doses of medications while hospitalized. --Restart home blood pressure medications.    Atrial flutter: Paroxysmal atrial flutter.  Sinus bradycardia at this time.  CHA2DS2 VASc score of 3, patient previously taking apixaban  at home. --Hold apixaban  at this time given potential need for a procedure. --Initiate heparin  drip.    Stage IIIb chronic kidney disease: --Avoid nephrotoxic agents.    Non-obstructive coronary artery disease: --Continue rosuvastatin. --Anticoagulation in place of aspirin .   --Avoid nephrotoxic agents.    Risk Assessment/Risk Scores:       New York  Heart Association (NYHA) Functional Class NYHA Class I  CHA2DS2-VASc Score = 3   This indicates a 3.2% annual risk of stroke. The patient's score is based upon: CHF History: 1 HTN History: 1 Diabetes History: 0 Stroke History: 0 Vascular  Disease History: 0 Age Score: 1 Gender Score: 0     Code Status: Full Code  Severity  of Illness: The appropriate patient status for this patient is INPATIENT. Inpatient status is judged to be reasonable and necessary in order to provide the required intensity of service to ensure the patient's safety. The patient's presenting symptoms, physical exam findings, and initial radiographic and laboratory data in the context of their chronic comorbidities is felt to place them at high risk for further clinical deterioration. Furthermore, it is not anticipated that the patient will be medically stable for discharge from the hospital within 2 midnights of admission.   * I certify that at the point of admission it is my clinical judgment that the patient will require inpatient hospital care spanning beyond 2 midnights from the point of admission due to high intensity of service, high risk for further deterioration and high frequency of surveillance required.*   For questions or updates, please contact [facility] Please consult [email or web address] for contact info under     Signed, Antonieta Battle, MD  11/16/2023 1:33 AM

## 2023-11-16 NOTE — Progress Notes (Signed)
 PHARMACY - ANTICOAGULATION CONSULT NOTE  Pharmacy Consult for Heparin  Indication: atrial flutter  Allergies  Allergen Reactions   Cefaclor Other (See Comments)    Hallucinations   Other Other (See Comments)    Deconamine was a brand name medication that contains two different drugs, an antihistamine (chlorpheniramine) and a decongestant (pseudoephedrine) = Hallucinations    Patient Measurements: Height: 6' (182.9 cm) Weight: 78.2 kg (172 lb 6.4 oz) IBW/kg (Calculated) : 77.6 HEPARIN  DW (KG): 78.2  Vital Signs: Temp: 97.7 F (36.5 C) (05/17 1146) Temp Source: Oral (05/17 1146) BP: 149/85 (05/17 1146) Pulse Rate: 47 (05/17 1146)  Labs: Recent Labs    11/16/23 0418 11/16/23 1328  HGB  --  10.2*  HCT  --  31.8*  PLT  --  219  APTT 35 73*  LABPROT 17.1*  --   INR 1.4*  --   HEPARINUNFRC >1.10*  --     CrCl cannot be calculated (Patient's most recent lab result is older than the maximum 21 days allowed.).   Medical History: Past Medical History:  Diagnosis Date   Abnormal kidney function    sees [place] kidney x 2    Anemia    Cancer (HCC) dx dec 2019   prostate   Cardiomegaly    Chronic kidney disease    Dysrhythmia    Hypertension    Pneumonia 1993   Sinus complaint     Medications:  Medications Prior to Admission  Medication Sig Dispense Refill Last Dose/Taking   amiodarone  (PACERONE ) 200 MG tablet Take 1 tablet (200 mg total) by mouth 2 (two) times daily for 14 days, THEN 1 tablet (200 mg total) daily. (Patient taking differently: Take1 tablet (200 mg total) daily.) 58 tablet 0 Past Week   apixaban  (ELIQUIS ) 5 MG TABS tablet Take 1 tablet (5 mg total) by mouth 2 (two) times daily. 60 tablet 0 11/12/2023   atorvastatin  (LIPITOR) 40 MG tablet Take 1 tablet (40 mg total) by mouth daily. 90 tablet 0 Past Week   Cholecalciferol (VITAMIN D3) 50 MCG (2000 UT) TABS Take 2,000 Units by mouth daily after breakfast.   Past Week   hydrALAZINE  (APRESOLINE ) 25 MG  tablet Take 1 tablet (25 mg total) by mouth every 8 (eight) hours. (Patient taking differently: Take 25 mg by mouth 4 (four) times daily.) 90 tablet 0 Past Week   hydrochlorothiazide  (HYDRODIURIL ) 25 MG tablet Take 25 mg by mouth daily.   Past Week   isosorbide  mononitrate (IMDUR ) 30 MG 24 hr tablet Take 0.5 tablets (15 mg total) by mouth daily. 90 tablet 0 Past Week   losartan  (COZAAR ) 100 MG tablet Take 100 mg by mouth daily.   Past Week   nitroGLYCERIN  (NITROSTAT ) 0.4 MG SL tablet Place 1 tablet (0.4 mg total) under the tongue every 5 (five) minutes x 3 doses as needed for chest pain. 100 tablet 12 Unknown   predniSONE (DELTASONE) 10 MG tablet Take 10 mg by mouth daily as needed (Gout flares).   Past Week   UNKNOWN TO PATIENT Place 1 drop into both eyes See admin instructions. Unnamed OTC eye drops (name possibly starts with "Med") for redness/dryness/itching : Instill 1 drop into both eyes up to three times a day as needed for dryness or irritation   Unknown    Assessment: 68 y.o. male with h/o Aflutter, Eliquis  on hold, for heparin .  aPTT this afternoon came back therapeutic at 73, on 950 units/hr. Hgb 10.2, plt 219. No s/sx of bleeding or infusion  issues.   Goal of Therapy:  aPTT 66-102 sec Heparin  level 0.3-0.7 units/ml Monitor platelets by anticoagulation protocol: Yes   Plan:  Continue heparin  infusion at 950 units/hr Check aPTT in 8 hours with AM labs  Monitor daily aPTT, CBC, and for s/sx of bleeding   Thank you for allowing pharmacy to participate in this patient's care,  Nieves Bars, PharmD, BCCCP Clinical Pharmacist  Phone: [phone] 11/16/2023 2:19 PM  Please check AMION for all [facility] Pharmacy phone numbers After 10:00 PM, call Main Pharmacy [phone]

## 2023-11-16 NOTE — Plan of Care (Signed)
  Problem: Clinical Measurements: Goal: Will remain free from infection Outcome: Progressing Goal: Cardiovascular complication will be avoided Outcome: Progressing   Problem: Activity: Goal: Risk for activity intolerance will decrease Outcome: Progressing   Problem: Safety: Goal: Ability to remain free from injury will improve Outcome: Progressing   Problem: Cardiac: Goal: Will achieve and/or maintain adequate cardiac output Outcome: Progressing   Problem: Activity: Goal: Capacity to carry out activities will improve Outcome: Progressing   Problem: Cardiac: Goal: Ability to achieve and maintain adequate cardiopulmonary perfusion will improve Outcome: Progressing

## 2023-11-16 NOTE — Progress Notes (Signed)
 PHARMACY - ANTICOAGULATION CONSULT NOTE  Pharmacy Consult for Heparin  Indication: atrial flutter  Allergies  Allergen Reactions   Cefaclor Other (See Comments)    Hallucinations   Other Other (See Comments)    Deconamine was a brand name medication that contains two different drugs, an antihistamine (chlorpheniramine) and a decongestant (pseudoephedrine) = Hallucinations    Patient Measurements: Height: 6' (182.9 cm) Weight: 78.2 kg (172 lb 6.4 oz) IBW/kg (Calculated) : 77.6 HEPARIN  DW (KG): 78.2  Vital Signs: Temp: 97.8 F (36.6 C) (05/17 0443) Temp Source: Oral (05/17 0443) BP: 149/74 (05/17 0541) Pulse Rate: 44 (05/17 0443)  Labs: Recent Labs    11/16/23 0418  APTT 35  LABPROT 17.1*  INR 1.4*  HEPARINUNFRC >1.10*    CrCl cannot be calculated (Patient's most recent lab result is older than the maximum 21 days allowed.).   Medical History: Past Medical History:  Diagnosis Date   Abnormal kidney function    sees [place] kidney x 2    Anemia    Cancer (HCC) dx dec 2019   prostate   Cardiomegaly    Chronic kidney disease    Dysrhythmia    Hypertension    Pneumonia 1993   Sinus complaint     Medications:  Medications Prior to Admission  Medication Sig Dispense Refill Last Dose/Taking   amiodarone  (PACERONE ) 200 MG tablet Take 1 tablet (200 mg total) by mouth 2 (two) times daily for 14 days, THEN 1 tablet (200 mg total) daily. 58 tablet 0    apixaban  (ELIQUIS ) 5 MG TABS tablet Take 1 tablet (5 mg total) by mouth 2 (two) times daily. 60 tablet 0    atorvastatin  (LIPITOR) 40 MG tablet Take 1 tablet (40 mg total) by mouth daily. 90 tablet 0    Cholecalciferol (VITAMIN D3) 50 MCG (2000 UT) TABS Take 2,000 Units by mouth daily after breakfast.      colchicine  0.6 MG tablet Take 0.6 mg by mouth 2 (two) times daily as needed (for gout flares).      hydrALAZINE  (APRESOLINE ) 25 MG tablet Take 1 tablet (25 mg total) by mouth every 8 (eight) hours. 90 tablet 0     isosorbide  mononitrate (IMDUR ) 30 MG 24 hr tablet Take 0.5 tablets (15 mg total) by mouth daily. 90 tablet 0    nitroGLYCERIN  (NITROSTAT ) 0.4 MG SL tablet Place 1 tablet (0.4 mg total) under the tongue every 5 (five) minutes x 3 doses as needed for chest pain. 100 tablet 12    UNKNOWN TO PATIENT Place 1 drop into both eyes See admin instructions. Unnamed OTC eye drops (name possibly starts with "Med") for redness/dryness/itching : Instill 1 drop into both eyes up to three times a day as needed for dryness or irritation       Assessment: 68 y.o. male with h/o Aflutter, Eliquis  on hold, for heparin   Goal of Therapy:  aPTT 66-102 sec Heparin  level 0.3-0.7 units/ml Monitor platelets by anticoagulation protocol: Yes   Plan:  Start heparin  950 units/hr Check aPTT in 8 hours   Vianna Venezia, Sharlyn Deaner 11/16/2023,5:43 AM

## 2023-11-17 ENCOUNTER — Encounter: Payer: Self-pay | Admitting: Physician Assistant

## 2023-11-17 ENCOUNTER — Other Ambulatory Visit: Payer: Self-pay

## 2023-11-17 ENCOUNTER — Encounter (HOSPITAL_COMMUNITY): Payer: Self-pay | Admitting: Internal Medicine

## 2023-11-17 DIAGNOSIS — R001 Bradycardia, unspecified: Secondary | ICD-10-CM

## 2023-11-17 DIAGNOSIS — I5022 Chronic systolic (congestive) heart failure: Secondary | ICD-10-CM | POA: Diagnosis not present

## 2023-11-17 LAB — BASIC METABOLIC PANEL WITH GFR
Anion gap: 10 (ref 5–15)
BUN: 22 mg/dL (ref 8–23)
CO2: 25 mmol/L (ref 22–32)
Calcium: 9.4 mg/dL (ref 8.9–10.3)
Chloride: 101 mmol/L (ref 98–111)
Creatinine, Ser: 2.71 mg/dL — ABNORMAL HIGH (ref 0.61–1.24)
GFR, Estimated: 25 mL/min — ABNORMAL LOW (ref >60)
Glucose, Bld: 82 mg/dL (ref 70–99)
Potassium: 4.9 mmol/L (ref 3.5–5.1)
Sodium: 136 mmol/L (ref 135–145)

## 2023-11-17 LAB — CBC
HCT: 30.3 % — ABNORMAL LOW (ref 39.0–52.0)
Hemoglobin: 9.9 g/dL — ABNORMAL LOW (ref 13.0–17.0)
MCH: 27.8 pg (ref 26.0–34.0)
MCHC: 32.7 g/dL (ref 30.0–36.0)
MCV: 85.1 fL (ref 80.0–100.0)
Platelets: 197 10*3/uL (ref 150–400)
RBC: 3.56 MIL/uL — ABNORMAL LOW (ref 4.22–5.81)
RDW: 14.2 % (ref 11.5–15.5)
WBC: 3.4 10*3/uL — ABNORMAL LOW (ref 4.0–10.5)
nRBC: 0 % (ref 0.0–0.2)

## 2023-11-17 LAB — APTT: aPTT: 87 s — ABNORMAL HIGH (ref 24–36)

## 2023-11-17 LAB — HEPARIN LEVEL (UNFRACTIONATED): Heparin Unfractionated: >1.1 [IU]/mL — ABNORMAL HIGH (ref 0.30–0.70)

## 2023-11-17 MED ORDER — AMIODARONE HCL 200 MG PO TABS
ORAL_TABLET | ORAL | 0 refills | Status: DC
Start: 2023-11-17 — End: 2024-01-13

## 2023-11-17 MED ORDER — ISOSORB DINITRATE-HYDRALAZINE 20-37.5 MG PO TABS
1.0000 | ORAL_TABLET | Freq: Three times a day (TID) | ORAL | Status: DC
  Administered 2023-11-17: 1 via ORAL
  Filled 2023-11-17 (×3): qty 1

## 2023-11-17 MED ORDER — ISOSORB DINITRATE-HYDRALAZINE 20-37.5 MG PO TABS: 1.0000 | ORAL_TABLET | Freq: Three times a day (TID) | ORAL | 3 refills | Status: AC

## 2023-11-17 MED ORDER — APIXABAN 5 MG PO TABS
5.0000 mg | ORAL_TABLET | Freq: Two times a day (BID) | ORAL | Status: DC
Start: 2023-11-17 — End: 2023-11-17
  Administered 2023-11-17: 5 mg via ORAL
  Filled 2023-11-17: qty 1

## 2023-11-17 NOTE — Discharge Summary (Addendum)
 Discharge Summary    Patient ID: Terry Helper Sr. 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 is a 68 y.o. male with HFrEF secondary to NICM, HTN, CKD stage IIIb, prostate CA s/p prostatectomy, NSVT.  2009, EF 40%, stress Myoview . 2016, EF 25 to 30% on echo done for dyspnea on exertion.  He could not get cardiac cath because of CKD.   2019, EF had gradually improved, EF 50 to 55% March 2025, last seen by cardiologist Dr. Alroy Aspen  He was admitted 4/21 - 10/25/2023 for NSTEMI, peak troponin 252, no obstructive disease disease on heart cath, EF 35% with grade 2 DD on echo, episodic atrial flutter with RVR started on amiodarone  and Eliquis , chronic bradycardia not on beta-blocker  He came to [facility] 11/16/2023 for feeling lightheaded, diaphoretic and with his blood pressure significantly elevated up to 216/97.  At [facility], he was noted to have a heart rate at times in the 30s and 40s.  His amiodarone  was held and he was transferred to [facility] for consideration of CRT-D.  Hospital Course     Consultants: EP  At [facility], his symptoms resolved.  He was kept on telemetry and an EP consult was called.  On 5/18, he was seen by Dr. Lawana Pray and it was felt that he was minimally symptomatic from the bradycardia.  Once his cardiac MRI is completed, a decision can be made regarding a defibrillator.  It is certainly possible that some of his dizziness and other symptoms were related to his elevated blood  pressure.  His hydralazine  and Imdur  were increased for further blood pressure control.  He is to have a early follow-up with cardiology for further med titration.  It is okay to continue the amiodarone  as well as the Eliquis .  Because of his renal function, we Teyla Skidgel hold the losartan  and the HCTZ.  See how the BiDil works, and decide on further medication management as an outpatient.  His symptoms had improved.  No further inpatient workup is indicated and he is considered stable for discharge, to follow-up as an outpatient.     Did the patient have an acute coronary syndrome (MI, NSTEMI, STEMI, etc) this admission?:  No                               Did the patient have a percutaneous coronary intervention (stent / angioplasty)?:  No.        _____________  Discharge Vitals Blood pressure (!) 147/73, pulse (!) 46, temperature 97.7 F (36.5 C), temperature source Oral, resp. rate 16, height 6' (1.829 m), weight 76.6 kg, SpO2 97%.  Filed Weights   11/15/23 2123 11/16/23 0443 11/17/23 0501  Weight: 78.2 kg 78.2 kg 76.6 kg    Labs & Radiologic Studies    CBC Recent Labs    11/16/23 1328 11/17/23 0441  WBC 3.7* 3.4*  HGB 10.2* 9.9*  HCT 31.8* 30.3*  MCV 87.6 85.1  PLT 219 197   Basic Metabolic Panel Recent Labs    96/04/[birth year] 1328  NA 138  K 4.5  CL 106  CO2 26  GLUCOSE 96  BUN 25*  CREATININE 2.50*  CALCIUM  9.3   Liver Function Tests No results for input(s): "AST", "ALT", "ALKPHOS", "BILITOT", "PROT", "ALBUMIN" in the last 72 hours. No results for input(s): "LIPASE", "AMYLASE" in the last 72 hours. High Sensitivity Troponin:   Recent Labs  Lab 10/21/23 2045 10/21/23 2246 10/22/23 0052 10/22/23 0254  TROPONINIHS 130* 190* 252* 244*    BNP Invalid input(s): "POCBNP" D-Dimer No results for input(s): "DDIMER" in the last 72 hours. Hemoglobin A1C No results for input(s): "HGBA1C" in the last 72 hours. Fasting Lipid Panel No results for input(s): "CHOL",  "HDL", "LDLCALC", "TRIG", "CHOLHDL", "LDLDIRECT" in the last 72 hours. Thyroid Function Tests No results for input(s): "TSH", "T4TOTAL", "T3FREE", "THYROIDAB" in the last 72 hours.  Invalid input(s): "FREET3" _____________  CARDIAC CATHETERIZATION Result Date: 10/24/2023   Prox RCA lesion is 30% stenosed.   2nd Mrg lesion is 40% stenosed.   Prox LAD to Mid LAD lesion is 30% stenosed.   Prox Cx to Dist Cx lesion is 20% stenosed. 1.  Mild nonobstructive coronary artery disease. 2.  Left ventricular angiography was not performed due to chronic kidney disease.  EF was moderately reduced by echo. 3.  Right heart catheterization showed normal right and left-sided filling pressures, mild pulmonary hypertension and normal cardiac output RA: 8 mmHg, RV: 39/2 mmHg, PW: 10 mmHg, PA: 39/10 mmHg.  Cardiac output is 5.63 with an index of 2.82. Recommendations: No clear culprit is identified for elevated troponin which is likely due to supply demand ischemia.  Possible underlying arrhythmia. Heparin  drip can be resumed 2 hours after TR band removal and a DOAC can be started tomorrow if needed. Only 15 mL of contrast was used for the procedure.   ECHOCARDIOGRAM COMPLETE Result Date: 10/22/2023    ECHOCARDIOGRAM REPORT   Patient Name:   Terry DASS Sr. Date of Exam: 10/22/2023 Medical Rec #:  [id number]          Height:       72.0 in Accession #:    [id number]         Weight:       172.0 lb Date of Birth:  [birth year]          BSA:          1.998 m Patient Age:    68 years           BP:           102/60 mmHg Patient Gender: M                  HR:           44 bpm. Exam Location:  Inpatient Procedure: 2D Echo, Cardiac Doppler, Color Doppler and Intracardiac            Opacification Agent (Both Spectral and Color Flow Doppler were            utilized during procedure). Indications:    122-I22.9 Subsequent ST elevation (STEM) and non-ST elevation                 (NSTEMI) myocardial infarction  History:        Patient has  prior history of Echocardiogram examinations, most                 recent  01/13/2018. CHF, Previous Myocardial Infarction, Abnormal                 ECG; Risk Factors:Hypertension and Dyslipidemia. Cancer.  Sonographer:    Raynelle Callow RDCS Referring Phys: [id number] ROBERT DORRELL IMPRESSIONS  1. Left ventricular ejection fraction, by estimation, is 30 to 35%. The left ventricle has moderately decreased function. The left ventricle demonstrates regional wall motion abnormalities (see scoring diagram/findings for description). Left ventricular  diastolic parameters are consistent with Grade II diastolic dysfunction (pseudonormalization). The inferior septum, entire inferior wall, apical anterior segment, and apex are hypokinetic.  2. Right ventricular systolic function is mildly reduced. The right ventricular size is mildly enlarged. Tricuspid regurgitation signal is inadequate for assessing PA pressure.  3. The mitral valve is normal in structure. Trivial mitral valve regurgitation. No evidence of mitral stenosis.  4. The aortic valve is tricuspid. Aortic valve regurgitation is not visualized. No aortic stenosis is present.  5. The inferior vena cava is normal in size with greater than 50% respiratory variability, suggesting right atrial pressure of 3 mmHg. Conclusion(s)/Recommendation(s): No left ventricular mural or apical thrombus/thrombi. FINDINGS  Left Ventricle: Left ventricular ejection fraction, by estimation, is 30 to 35%. The left ventricle has moderately decreased function. The left ventricle demonstrates regional wall motion abnormalities. Definity  contrast agent was given IV to delineate the left ventricular endocardial borders. The left ventricular internal cavity size was normal in size. There is no left ventricular hypertrophy. Left ventricular diastolic parameters are consistent with Grade II diastolic dysfunction (pseudonormalization).  LV Wall Scoring: The inferior septum, entire inferior wall, apical  anterior segment, and apex are hypokinetic. The inferior septum, entire inferior wall, apical anterior segment, and apex are hypokinetic. Right Ventricle: The right ventricular size is mildly enlarged. Right vetricular wall thickness was not well visualized. Right ventricular systolic function is mildly reduced. Tricuspid regurgitation signal is inadequate for assessing PA pressure. Left Atrium: Left atrial size was normal in size. Right Atrium: Right atrial size was normal in size. Pericardium: Trivial pericardial effusion is present. Mitral Valve: The mitral valve is normal in structure. Trivial mitral valve regurgitation. No evidence of mitral valve stenosis. Tricuspid Valve: The tricuspid valve is normal in structure. Tricuspid valve regurgitation is trivial. No evidence of tricuspid stenosis. Aortic Valve: The aortic valve is tricuspid. Aortic valve regurgitation is not visualized. No aortic stenosis is present. Aortic valve mean gradient measures 7.0 mmHg. Aortic valve peak gradient measures 12.4 mmHg. Aortic valve area, by VTI measures 2.76  cm. Pulmonic Valve: The pulmonic valve was not well visualized. Pulmonic valve regurgitation is not visualized. No evidence of pulmonic stenosis. Aorta: The aortic root and ascending aorta are structurally normal, with no evidence of dilitation. Venous: The inferior vena cava is normal in size with greater than 50% respiratory variability, suggesting right atrial pressure of 3 mmHg. IAS/Shunts: The interatrial septum was not well visualized.  LEFT VENTRICLE PLAX 2D LVIDd:         5.40 cm      Diastology LVIDs:         4.63 cm      LV e' medial:    7.00 cm/s LV PW:         1.04 cm      LV E/e' medial:  10.9 LV IVS:        1.05 cm      LV e' lateral:   6.00 cm/s LVOT diam:     2.40 cm      LV E/e'  lateral: 12.7 LV SV:         113 LV SV Index:   56 LVOT Area:     4.52 cm  LV Volumes (MOD) LV vol d, MOD A2C: 134.0 ml LV vol d, MOD A4C: 122.0 ml LV vol s, MOD A2C: 67.2 ml LV  vol s, MOD A4C: 80.5 ml LV SV MOD A2C:     66.8 ml LV SV MOD A4C:     122.0 ml LV SV MOD BP:      56.2 ml RIGHT VENTRICLE            IVC RV S prime:     9.90 cm/s  IVC diam: 1.20 cm TAPSE (M-mode): 2.2 cm LEFT ATRIUM             Index        RIGHT ATRIUM           Index LA diam:        3.40 cm 1.70 cm/m   RA Area:     16.90 cm LA Vol (A2C):   76.4 ml 38.24 ml/m  RA Volume:   46.40 ml  23.22 ml/m LA Vol (A4C):   49.5 ml 24.77 ml/m LA Biplane Vol: 64.1 ml 32.08 ml/m  AORTIC VALVE AV Area (Vmax):    2.97 cm AV Area (Vmean):   2.61 cm AV Area (VTI):     2.76 cm AV Vmax:           176.00 cm/s AV Vmean:          125.000 cm/s AV VTI:            0.409 m AV Peak Grad:      12.4 mmHg AV Mean Grad:      7.0 mmHg LVOT Vmax:         115.60 cm/s LVOT Vmean:        72.140 cm/s LVOT VTI:          0.249 m LVOT/AV VTI ratio: 0.61  AORTA Ao Root diam: 3.20 cm Ao Asc diam:  3.50 cm MITRAL VALVE MV Area (PHT): 3.38 cm    SHUNTS MV Decel Time: 225 msec    Systemic VTI:  0.25 m MV E velocity: 76.20 cm/s  Systemic Diam: 2.40 cm MV A velocity: 59.45 cm/s MV E/A ratio:  1.28 Sunit Tolia Electronically signed by Olinda Bertrand Signature Date/Time: 10/22/2023/4:33:18 PM    Final    CT Head Wo Contrast Result Date: 10/22/2023 CLINICAL DATA:  Dizziness and fall EXAM: CT HEAD WITHOUT CONTRAST CT CERVICAL SPINE WITHOUT CONTRAST TECHNIQUE: Multidetector CT imaging of the head and cervical spine was performed following the standard protocol without intravenous contrast. Multiplanar CT image reconstructions of the cervical spine were also generated. RADIATION DOSE REDUCTION: This exam was performed according to the departmental dose-optimization program which includes automated exposure control, adjustment of the mA and/or kV according to patient size and/or use of iterative reconstruction technique. COMPARISON:  None Available. FINDINGS: CT HEAD FINDINGS Brain: Old right basal ganglia small vessel infarct. No mass, hemorrhage or extra-axial  collection. Vascular: No hyperdense vessel or unexpected vascular calcification. Skull: The visualized skull base, calvarium and extracranial soft tissues are normal. Sinuses/Orbits: No fluid levels or advanced mucosal thickening of the visualized paranasal sinuses. No mastoid or middle ear effusion. Normal orbits. Other: None. CT CERVICAL SPINE FINDINGS Alignment: No static subluxation. Facets are aligned. Occipital condyles are normally positioned. Skull base and vertebrae: No acute fracture. Soft tissues and spinal  canal: No prevertebral fluid or swelling. No visible canal hematoma. Disc levels: No advanced spinal canal or neural foraminal stenosis. Upper chest: Right apical opacities. Other: Normal visualized paraspinal cervical soft tissues. IMPRESSION: 1. No acute intracranial abnormality. 2. No acute fracture or static subluxation of the cervical spine. 3. Right apical pulmonary opacities. Electronically Signed   By: Juanetta Nordmann M.D.   On: 10/22/2023 03:53   CT Cervical Spine Wo Contrast Result Date: 10/22/2023 CLINICAL DATA:  Dizziness and fall EXAM: CT HEAD WITHOUT CONTRAST CT CERVICAL SPINE WITHOUT CONTRAST TECHNIQUE: Multidetector CT imaging of the head and cervical spine was performed following the standard protocol without intravenous contrast. Multiplanar CT image reconstructions of the cervical spine were also generated. RADIATION DOSE REDUCTION: This exam was performed according to the departmental dose-optimization program which includes automated exposure control, adjustment of the mA and/or kV according to patient size and/or use of iterative reconstruction technique. COMPARISON:  None Available. FINDINGS: CT HEAD FINDINGS Brain: Old right basal ganglia small vessel infarct. No mass, hemorrhage or extra-axial collection. Vascular: No hyperdense vessel or unexpected vascular calcification. Skull: The visualized skull base, calvarium and extracranial soft tissues are normal. Sinuses/Orbits: No  fluid levels or advanced mucosal thickening of the visualized paranasal sinuses. No mastoid or middle ear effusion. Normal orbits. Other: None. CT CERVICAL SPINE FINDINGS Alignment: No static subluxation. Facets are aligned. Occipital condyles are normally positioned. Skull base and vertebrae: No acute fracture. Soft tissues and spinal canal: No prevertebral fluid or swelling. No visible canal hematoma. Disc levels: No advanced spinal canal or neural foraminal stenosis. Upper chest: Right apical opacities. Other: Normal visualized paraspinal cervical soft tissues. IMPRESSION: 1. No acute intracranial abnormality. 2. No acute fracture or static subluxation of the cervical spine. 3. Right apical pulmonary opacities. Electronically Signed   By: Juanetta Nordmann M.D.   On: 10/22/2023 03:53   DG Chest 2 View Result Date: 10/21/2023 CLINICAL DATA:  Chest pain. EXAM: CHEST - 2 VIEW COMPARISON:  Nov 10, 2020 FINDINGS: The heart size and mediastinal contours are within normal limits. There is stable right apical pleural fluid versus pleural thickening. No acute infiltrate or pneumothorax is identified. Stable postsurgical changes are seen along the lateral aspect of the mid right hemithorax. No acute osseous abnormalities are identified. IMPRESSION: Stable chronic and postoperative changes without evidence of acute cardiopulmonary disease. Electronically Signed   By: Virgle Grime M.D.   On: 10/21/2023 23:52   Disposition   Pt is being discharged home today in improved condition.  Follow-up Plans & Appointments     Discharge Instructions     Diet - low sodium heart healthy   Complete by: As directed    Increase activity slowly   Complete by: As directed         Discharge Medications   Allergies as of 11/17/2023       Reactions   Cefaclor Other (See Comments)   Hallucinations   Other Other (See Comments)   Deconamine was a brand name medication that contains two different drugs, an  antihistamine (chlorpheniramine) and a decongestant (pseudoephedrine) = Hallucinations        Medication List     STOP taking these medications    hydrALAZINE  25 MG tablet Commonly known as: APRESOLINE    hydrochlorothiazide  25 MG tablet Commonly known as: HYDRODIURIL    isosorbide  mononitrate 30 MG 24 hr tablet Commonly known as: IMDUR    losartan  100 MG tablet Commonly known as: COZAAR        TAKE these  medications    amiodarone  200 MG tablet Commonly known as: PACERONE  Take1 tablet (200 mg total) daily.   apixaban  5 MG Tabs tablet Commonly known as: ELIQUIS  Take 1 tablet (5 mg total) by mouth 2 (two) times daily.   atorvastatin  40 MG tablet Commonly known as: LIPITOR Take 1 tablet (40 mg total) by mouth daily.   isosorbide -hydrALAZINE  20-37.5 MG tablet Commonly known as: BIDIL Take 1 tablet by mouth 3 (three) times daily.   nitroGLYCERIN  0.4 MG SL tablet Commonly known as: NITROSTAT  Place 1 tablet (0.4 mg total) under the tongue every 5 (five) minutes x 3 doses as needed for chest pain.   predniSONE 10 MG tablet Commonly known as: DELTASONE Take 10 mg by mouth daily as needed (Gout flares).   UNKNOWN TO PATIENT Place 1 drop into both eyes See admin instructions. Unnamed OTC eye drops (name possibly starts with "Med") for redness/dryness/itching : Instill 1 drop into both eyes up to three times a day as needed for dryness or irritation   Vitamin D3 50 MCG (2000 UT) Tabs Take 2,000 Units by mouth daily after breakfast.           Outstanding Labs/Studies   None  Duration of Discharge Encounter: APP Time: 36 minutes   Signed, Armandina Bernard, PA-C 11/17/2023, 2:00 PM    I have seen and examined this patient with Armandina Bernard.  Agree with above, note added to reflect my findings.  The hospital with bradycardia and hypertension.  Blood pressure is better controlled.  Heart rates are consistent with his normal heart rates.  Feeling well today.  Nahum Sherrer  plan for discharge with follow-up in clinic.  GEN: No acute distress.   Neck: No JVD Cardiac: RRR, no murmurs, rubs, or gallops.  Respiratory: normal BS bases bilaterally. GI: Soft, nontender, non-distended  MS: No edema; No deformity. Neuro:  Nonfocal  Skin: warm and dry,  Psych: Normal affect      Tamiyah Moulin M. Estefan Pattison MD 11/17/2023 2:22 PM

## 2023-11-17 NOTE — Progress Notes (Signed)
 Reviewed AVS with patient. Informed patient of medication changes and where to pick up medications after discharge. IV flushed with NS and removed. Site clean and dry. All questions were answered. Patient informed of upcoming appointments. Patient verbalized understanding.

## 2023-11-17 NOTE — Progress Notes (Signed)
 PHARMACY - ANTICOAGULATION CONSULT NOTE  Pharmacy Consult for Heparin  Indication: atrial flutter  Allergies  Allergen Reactions   Cefaclor Other (See Comments)    Hallucinations   Other Other (See Comments)    Deconamine was a brand name medication that contains two different drugs, an antihistamine (chlorpheniramine) and a decongestant (pseudoephedrine) = Hallucinations    Patient Measurements: Height: 6' (182.9 cm) Weight: 76.6 kg (168 lb 14.4 oz) IBW/kg (Calculated) : 77.6 HEPARIN  DW (KG): 78.2  Vital Signs: Temp: 97.9 F (36.6 C) (05/18 0501) Temp Source: Oral (05/18 0501) BP: 154/77 (05/18 0501) Pulse Rate: 46 (05/18 0501)  Labs: Recent Labs    11/16/23 0418 11/16/23 1328 11/17/23 0441  HGB  --  10.2* 9.9*  HCT  --  31.8* 30.3*  PLT  --  219 197  APTT 35 73* 87*  LABPROT 17.1*  --   --   INR 1.4*  --   --   HEPARINUNFRC >1.10*  --  >1.10*  CREATININE  --  2.50*  --     Estimated Creatinine Clearance: 30.2 mL/min (A) (by C-G formula based on SCr of 2.5 mg/dL (H)).   Medical History: Past Medical History:  Diagnosis Date   Abnormal kidney function    sees [place] kidney x 2    Anemia    Cancer (HCC) dx dec 2019   prostate   Cardiomegaly    Chronic kidney disease    Dysrhythmia    Hypertension    Pneumonia 1993   Sinus complaint     Medications:  Medications Prior to Admission  Medication Sig Dispense Refill Last Dose/Taking   amiodarone  (PACERONE ) 200 MG tablet Take 1 tablet (200 mg total) by mouth 2 (two) times daily for 14 days, THEN 1 tablet (200 mg total) daily. (Patient taking differently: Take1 tablet (200 mg total) daily.) 58 tablet 0 Past Week   apixaban  (ELIQUIS ) 5 MG TABS tablet Take 1 tablet (5 mg total) by mouth 2 (two) times daily. 60 tablet 0 11/12/2023   atorvastatin  (LIPITOR) 40 MG tablet Take 1 tablet (40 mg total) by mouth daily. 90 tablet 0 Past Week   Cholecalciferol (VITAMIN D3) 50 MCG (2000 UT) TABS Take 2,000 Units by mouth  daily after breakfast.   Past Week   hydrALAZINE  (APRESOLINE ) 25 MG tablet Take 1 tablet (25 mg total) by mouth every 8 (eight) hours. (Patient taking differently: Take 25 mg by mouth 4 (four) times daily.) 90 tablet 0 Past Week   hydrochlorothiazide  (HYDRODIURIL ) 25 MG tablet Take 25 mg by mouth daily.   Past Week   isosorbide  mononitrate (IMDUR ) 30 MG 24 hr tablet Take 0.5 tablets (15 mg total) by mouth daily. 90 tablet 0 Past Week   losartan  (COZAAR ) 100 MG tablet Take 100 mg by mouth daily.   Past Week   nitroGLYCERIN  (NITROSTAT ) 0.4 MG SL tablet Place 1 tablet (0.4 mg total) under the tongue every 5 (five) minutes x 3 doses as needed for chest pain. 100 tablet 12 Unknown   predniSONE (DELTASONE) 10 MG tablet Take 10 mg by mouth daily as needed (Gout flares).   Past Week   UNKNOWN TO PATIENT Place 1 drop into both eyes See admin instructions. Unnamed OTC eye drops (name possibly starts with "Med") for redness/dryness/itching : Instill 1 drop into both eyes up to three times a day as needed for dryness or irritation   Unknown    Assessment: 68 y.o. male with h/o Aflutter, Eliquis  on hold, for heparin .  Heparin  level is undetectably high (>1.1) likely still impacted by DOAC, aPTT this afternoon came back therapeutic at 87, on 950 units/hr. Hgb 9.9, plt 197. No s/sx of bleeding or infusion issues.   Goal of Therapy:  aPTT 66-102 sec Heparin  level 0.3-0.7 units/ml Monitor platelets by anticoagulation protocol: Yes   Plan:  Continue heparin  infusion at 950 units/hr Monitor daily aPTT/HL until correlate, CBC, and for s/sx of bleeding   Thank you for allowing pharmacy to participate in this patient's care,  Nieves Bars, PharmD, BCCCP Clinical Pharmacist  Phone: [phone] 11/17/2023 7:34 AM  Please check AMION for all [facility] Pharmacy phone numbers After 10:00 PM, call Main Pharmacy [phone]

## 2023-11-17 NOTE — Consult Note (Signed)
 Cardiology Consultation   Patient ID: Terry Helper Sr. MRN: [id number]; DOB: [birth year]  Admit date: 11/15/2023 Date of Consult: 11/17/2023  PCP:  Roselind Congo, MD   [facility] Providers Cardiologist:  Ahmad Alert, MD  Electrophysiologist:  Lei Pump, MD       Patient Profile:   Terry Moss. is a 68 y.o. male with a hx of heart failure with reduced ejection fraction, nonobstructive coronary artery disease, hypertension, CKD stage IIIb, prostate cancer post prostatectomy who is being seen 11/17/2023 for the evaluation of symptomatic bradycardia at the request of Diane Forte.  History of Present Illness:   [name] was recently hospitalized for 2125 due to concern for ACS.  Left heart catheterization showed nonobstructive coronary artery disease.  He had an echo that showed an ejection fraction of 30 to 35%.  He had atrial flutter with rapid rates and was started on amiodarone .  He presented yesterday with significant hypertension.  Blood pressure was up to 200/94.  He had some lightheadedness, felt hot and sweaty.  He had no dizziness or near syncope.  He also denied chest pain shortness of breath or palpitations.  In the emergency room he was noted to be bradycardic at times.  Currently he feels well and is without complaint.  He states that he has been bradycardic his entire life.   Past Medical History:  Diagnosis Date   Abnormal kidney function    sees [place] kidney x 2    Anemia    Cancer (HCC) dx dec 2019   prostate   Cardiomegaly    Chronic kidney disease    Dysrhythmia    Hypertension    Pneumonia 1993   Sinus complaint     Past Surgical History:  Procedure Laterality Date   ELBOW SURGERY Right 05/2018   PELVIC LYMPH NODE DISSECTION Bilateral 11/14/2018   Procedure: PELVIC LYMPH NODE DISSECTION;  Surgeon: Andrez Banker, MD;  Location: [facility];  Service: Urology;  Laterality: Bilateral;   pleurisy  1993   Right lung  surgery    PROSTATE BIOPSY     RIGHT/LEFT HEART CATH AND CORONARY ANGIOGRAPHY N/A 10/24/2023   Procedure: RIGHT/LEFT HEART CATH AND CORONARY ANGIOGRAPHY;  Surgeon: Wenona Hamilton, MD;  Location: [facility];  Service: Cardiovascular;  Laterality: N/A;   ROBOT ASSISTED LAPAROSCOPIC RADICAL PROSTATECTOMY N/A 11/14/2018   Procedure: XI ROBOTIC ASSISTED LAPAROSCOPIC RADICAL PROSTATECTOMY;  Surgeon: Andrez Banker, MD;  Location: [facility];  Service: Urology;  Laterality: N/A;     Home Medications:  Prior to Admission medications   Medication Sig Start Date End Date Taking? Authorizing Provider  amiodarone  (PACERONE ) 200 MG tablet Take 1 tablet (200 mg total) by mouth 2 (two) times daily for 14 days, THEN 1 tablet (200 mg total) daily. Patient taking differently: Take1 tablet (200 mg total) daily. 10/25/23 12/08/23 Yes Dahal, Aminta Baldy, MD  apixaban  (ELIQUIS ) 5 MG TABS tablet Take 1 tablet (5 mg total) by mouth 2 (two) times daily. 10/25/23 11/24/23 Yes Dahal, Aminta Baldy, MD  atorvastatin  (LIPITOR) 40 MG tablet Take 1 tablet (40 mg total) by mouth daily. 10/26/23  Yes Dahal, Aminta Baldy, MD  Cholecalciferol (VITAMIN D3) 50 MCG (2000 UT) TABS Take 2,000 Units by mouth daily after breakfast.   Yes [provider]  hydrALAZINE  (APRESOLINE ) 25 MG tablet Take 1 tablet (25 mg total) by mouth every 8 (eight) hours. Patient taking differently: Take 25 mg by mouth 4 (four) times daily. 10/25/23  Yes Dahal, Aminta Baldy, MD  hydrochlorothiazide  (HYDRODIURIL ) 25 MG tablet Take 25 mg by mouth daily. 11/12/23  Yes [provider]  isosorbide  mononitrate (IMDUR ) 30 MG 24 hr tablet Take 0.5 tablets (15 mg total) by mouth daily. 10/26/23  Yes Dahal, Aminta Baldy, MD  losartan  (COZAAR ) 100 MG tablet Take 100 mg by mouth daily.   Yes [provider]  nitroGLYCERIN  (NITROSTAT ) 0.4 MG SL tablet Place 1 tablet (0.4 mg total) under the tongue every 5 (five) minutes x 3 doses as needed for chest pain. 10/25/23  Yes  Dahal, Aminta Baldy, MD  predniSONE (DELTASONE) 10 MG tablet Take 10 mg by mouth daily as needed (Gout flares).   Yes [provider]  UNKNOWN TO PATIENT Place 1 drop into both eyes See admin instructions. Unnamed OTC eye drops (name possibly starts with "Med") for redness/dryness/itching : Instill 1 drop into both eyes up to three times a day as needed for dryness or irritation   Yes [provider]    Inpatient Medications: Scheduled Meds:  atorvastatin   40 mg Oral Daily   cholecalciferol  2,000 Units Oral Daily   hydrALAZINE   25 mg Oral Q8H   isosorbide  mononitrate  15 mg Oral Daily   Continuous Infusions:  heparin  950 Units/hr (11/17/23 0542)   PRN Meds: colchicine   Allergies:    Allergies  Allergen Reactions   Cefaclor Other (See Comments)    Hallucinations   Other Other (See Comments)    Deconamine was a brand name medication that contains two different drugs, an antihistamine (chlorpheniramine) and a decongestant (pseudoephedrine) = Hallucinations    Social History:   Social History   Socioeconomic History   Marital status: Divorced    Spouse name: Not on file   Number of children: 2   Years of education: Not on file   Highest education level: Not on file  Occupational History   Occupation: filter specialist  Tobacco Use   Smoking status: Never   Smokeless tobacco: Never  Vaping Use   Vaping status: Never Used  Substance and Sexual Activity   Alcohol use: Yes    Comment: occ wine   Drug use: No   Sexual activity: Not on file  Other Topics Concern   Not on file  Social History Narrative   Not on file   Social Drivers of Health   Financial Resource Strain: Not on file  Food Insecurity: No Food Insecurity (11/15/2023)   Hunger Vital Sign    Worried About Running Out of Food in the Last Year: Never true    Ran Out of Food in the Last Year: Never true  Transportation Needs: No Transportation Needs (11/15/2023)   PRAPARE - Therapist, art (Medical): No    Lack of Transportation (Non-Medical): No  Physical Activity: Not on file  Stress: Not on file  Social Connections: Moderately Integrated (11/15/2023)   Social Connection and Isolation Panel [NHANES]    Frequency of Communication with Friends and Family: More than three times a week    Frequency of Social Gatherings with Friends and Family: Twice a week    Attends Religious Services: More than 4 times per year    Active Member of [employer] or Organizations: Yes    Attends Banker Meetings: 1 to 4 times per year    Marital Status: Divorced  Intimate Partner Violence: Not At Risk (11/15/2023)   Humiliation, Afraid, Rape, and Kick questionnaire    Fear of Current or Ex-Partner:  No    Emotionally Abused: No    Physically Abused: No    Sexually Abused: No    Family History:    Family History  Problem Relation Age of Onset   Heart disease Father        deceased   Stroke Father    Hypertension Mother        deceased   Stroke Mother    Heart disease Brother    Heart disease Sister        x 2 sister   Heart attack Brother    Stroke Brother      ROS:  Please see the history of present illness.   All other ROS reviewed and negative.     Physical Exam/Data:   Vitals:   11/16/23 2105 11/16/23 2334 11/17/23 0501 11/17/23 0830  BP: (!) 170/89 (!) 153/85 (!) 154/77 (!) 157/89  Pulse:  (!) 49 (!) 46   Resp:  16 15 14   Temp:  97.9 F (36.6 C) 97.9 F (36.6 C) 97.8 F (36.6 C)  TempSrc:  Oral Oral Oral  SpO2:  99% 97%   Weight:   76.6 kg   Height:        Intake/Output Summary (Last 24 hours) at 11/17/2023 0846 Last data filed at 11/17/2023 0700 Gross per 24 hour  Intake 1064.6 ml  Output --  Net 1064.6 ml      11/17/2023    5:01 AM 11/16/2023    4:43 AM 11/15/2023    9:23 PM  Last 3 Weights  Weight (lbs) 168 lb 14.4 oz 172 lb 6.4 oz 172 lb 6.4 oz  Weight (kg) 76.613 kg 78.2 kg 78.2 kg     Body mass index is 22.91 kg/m.   General:  Well nourished, well developed, in no acute distress HEENT: normal Neck: no JVD Vascular: No carotid bruits; Distal pulses 2+ bilaterally Cardiac: Bradycardic, no murmur  Lungs:  clear to auscultation bilaterally, no wheezing, rhonchi or rales  Abd: soft, nontender, no hepatomegaly  Ext: no edema Musculoskeletal:  No deformities, BUE and BLE strength normal and equal Skin: warm and dry  Neuro:  CNs 2-12 intact, no focal abnormalities noted Psych:  Normal affect   EKG:  The EKG was personally reviewed and demonstrates: Sinus bradycardia with PVCs Telemetry:  Telemetry was personally reviewed and demonstrates: Sinus rhythm  Relevant CV Studies: Ejection fraction 30 to 35%  Laboratory Data:  High Sensitivity Troponin:   Recent Labs  Lab 10/21/23 2045 10/21/23 2246 10/22/23 0052 10/22/23 0254  TROPONINIHS 130* 190* 252* 244*     Chemistry Recent Labs  Lab 11/16/23 1328  NA 138  K 4.5  CL 106  CO2 26  GLUCOSE 96  BUN 25*  CREATININE 2.50*  CALCIUM  9.3  GFRNONAA 27*  ANIONGAP 6    No results for input(s): "PROT", "ALBUMIN", "AST", "ALT", "ALKPHOS", "BILITOT" in the last 168 hours. Lipids No results for input(s): "CHOL", "TRIG", "HDL", "LABVLDL", "LDLCALC", "CHOLHDL" in the last 168 hours.  Hematology Recent Labs  Lab 11/16/23 1328 11/17/23 0441  WBC 3.7* 3.4*  RBC 3.63* 3.56*  HGB 10.2* 9.9*  HCT 31.8* 30.3*  MCV 87.6 85.1  MCH 28.1 27.8  MCHC 32.1 32.7  RDW 14.4 14.2  PLT 219 197   Thyroid No results for input(s): "TSH", "FREET4" in the last 168 hours.  BNPNo results for input(s): "BNP", "PROBNP" in the last 168 hours.  DDimer No results for input(s): "DDIMER" in the last 168 hours.  Radiology/Studies:  No results found.   Assessment and Plan:   Sinus bradycardia: Has likely sick sinus syndrome.  Patient is minimally symptomatic.  He states that he has been bradycardic for quite some time.  At this point, would hold off on pacemaker  implant.  He does have a new heart failure, and may require defibrillator implant.  He has a cardiac MRI upcoming. Chronic systolic heart failure: Due to nonischemic cardiomyopathy with recent left heart catheterization.  He is feeling well.  His blood pressure was significantly elevated.  No obvious heart failure symptoms. Hypertension: Has renal failure and cardiomyopathy.  Blood pressures in the 150s currently.  Noora Locascio increase hydralazine  and Imdur .  He can follow-up with further titration in cardiology clinic. Atrial fibrillation/flutter: Continue amiodarone .  Igor Bishop switch to Eliquis  at discharge. CKD stage IIIb: Avoiding nephrotoxic agents Nonobstructive coronary artery disease: Continue Crestor.   Risk Assessment/Risk Scores:     For questions or updates, please contact [facility] Please consult [email or web address] for contact info under    Signed, Annabell Oconnor Cortland Ding, MD  11/17/2023 8:46 AM

## 2023-11-20 ENCOUNTER — Other Ambulatory Visit (HOSPITAL_COMMUNITY)

## 2023-11-22 ENCOUNTER — Telehealth: Payer: Self-pay | Admitting: Cardiovascular Disease

## 2023-11-22 NOTE — Telephone Encounter (Signed)
 STAT if HR is under 50 or over 120 (normal HR is 60-100 beats per minute)  What is your heart rate? Running from 40 to 48- at this time it is 50  Do you have a log of your heart rate readings (document readings)?   Do you have any other symptoms?  Last night lightheaded, some shortness of breath, just did not feel good

## 2023-11-22 NOTE — Telephone Encounter (Signed)
 Pt states last night he was "feeling bad, light headed and unsteady" last night that lasted a few hours. He checked his HR and reportedly it was 41-45bpm over the course of those hours. BP was 178/59 to 131/65. Since waking this morning, he has felt better. BP today is 169/68, HR now is 50bpm. PCP had him increase the Bidil  yesterday to 2 tablets three times daily, but he's only had two doses thus far at the increase.  Per ED discharge summary by Camnitz: On 5/18, he was seen by Dr. Lawana Pray and it was felt that he was minimally symptomatic from the bradycardia.  Once his cardiac MRI is completed, a decision can be made regarding a defibrillator.  It is certainly possible that some of his dizziness and other symptoms were related to his elevated blood pressure. is hydralazine  and Imdur  were increased for further blood pressure control.  He is to have a early follow-up with cardiology for further med titration. It is okay to continue the amiodarone  as well as the Eliquis . Because of his renal function, we will hold the losartan  and the HCTZ.  See how the BiDil  works, and decide on further medication management as an outpatient. Gave pt ED precautions. At time of call his HR is stable and symptoms are mild. He will contact PCP for further med adjustment to keep BP down (and if increase doesn't control BP). He's scheduled here on 5/29 w/[facility] and cMRI is currently scheduled for 12/13/23. Message sent to Jinger Mount and Chase Copping to see if we can MRI moved up.

## 2023-11-26 NOTE — Progress Notes (Unsigned)
 Cardiology Clinic Note   Patient Name: Terry BROGDEN Sr. Date of Encounter: 11/28/2023  Primary Care Provider:  Roselind Congo, MD Primary Cardiologist:  Ahmad Alert, MD  Patient Profile    Terry Helper Sr. 68 year old male presents to the clinic today for follow-up evaluation of his essential hypertension, chronic systolic CHF and near syncope.  Past Medical History    Past Medical History:  Diagnosis Date   Abnormal kidney function    sees [place] kidney x 2    Anemia    Cancer (HCC) dx dec 2019   prostate   Cardiomegaly    Chronic kidney disease    Dysrhythmia    Hypertension    Pneumonia 1993   Sinus complaint    Past Surgical History:  Procedure Laterality Date   ELBOW SURGERY Right 05/2018   PELVIC LYMPH NODE DISSECTION Bilateral 11/14/2018   Procedure: PELVIC LYMPH NODE DISSECTION;  Surgeon: Andrez Banker, MD;  Location: [facility];  Service: Urology;  Laterality: Bilateral;   pleurisy  1993   Right lung surgery    PROSTATE BIOPSY     RIGHT/LEFT HEART CATH AND CORONARY ANGIOGRAPHY N/A 10/24/2023   Procedure: RIGHT/LEFT HEART CATH AND CORONARY ANGIOGRAPHY;  Surgeon: Wenona Hamilton, MD;  Location: [facility];  Service: Cardiovascular;  Laterality: N/A;   ROBOT ASSISTED LAPAROSCOPIC RADICAL PROSTATECTOMY N/A 11/14/2018   Procedure: XI ROBOTIC ASSISTED LAPAROSCOPIC RADICAL PROSTATECTOMY;  Surgeon: Andrez Banker, MD;  Location: [facility];  Service: Urology;  Laterality: N/A;    Allergies  Allergies  Allergen Reactions   Cefaclor Other (See Comments)    Hallucinations   Other Other (See Comments)    Deconamine was a brand name medication that contains two different drugs, an antihistamine (chlorpheniramine) and a decongestant (pseudoephedrine) = Hallucinations    History of Present Illness    Terry R Limb Sr. has a PMH of bradycardia, chronic systolic CHF, HTN, NICM, stage III CKD, prostate cancer status post prostatectomy, and NSVT.   EF 2009 showed an EF of 40% via stress test, echocardiogram 2016 showed LVEF of 25-30% (unable to get cardiac catheterization due to CKD), his EF improved to 50-55% in 2019.  He was seen in follow-up by Dr. Alroy Aspen 3/25.  During that time he continued to be very active.  He was having some knee pain.  He complained of gout symptoms.  He noted that he is 1 of 12 children in his family.  His older sister is the only person who does not have a pacemaker.  He was admitted on 10/21/2023 and discharged on 10/25/2023 with NSTEMI.  His cardiac troponins peaked at 252.  He underwent cardiac catheterization which showed no obstructive coronary disease.  His EF was noted to be 35% with G2 DD on echocardiogram.  He had an episode of atrial flutter with RVR and was started on amiodarone  and apixaban .  Due to his chronic bradycardia he was not placed on beta-blocker therapy.  He was transferred from [facility] at [facility] 11/16/2023 for feeling lightheaded, diaphoretic and he was noted to have significantly elevated blood pressure in the 216/97 range.  At [facility] he was noted to have a heart rate of 30s-40s.  His amiodarone  was held and he was transferred to [facility] for consideration of CRT-D.  EP consult was placed.  At [facility] his symptoms resolved.  He was monitored on telemetry.  He was seen by Dr. Lawana Pray 11/17/2023.  It was  felt that he was minimally symptomatic with his bradycardia.  It was felt that his dizziness may have been related to blood pressure changes.  His blood pressure medications (Imdur  and hydralazine  had been increased for further blood pressure control.  He was continued on amiodarone  and Eliquis .  Due to his renal function his losartan  and HCTZ were placed on hold.  He was discharged and instructed to follow-up with cardiology as an outpatient.  Cardiac MRI scheduled for 12/13/2023.  He presents to the clinic today for follow-up evaluation and states he has started  to exercise again.  He did jump rope, polyps, and used his dumbbells today.  He worked out for about 20 minutes.  He felt fine with this.  We reviewed his recent emergency department visit and recommendations for cardiac MRI.  He expressed understanding.  Initially his blood pressure today is 160/64 and on recheck it is 152/70.  I encouraged him to continue to eat a heart healthy low-sodium diet, prescribed amlodipine  2.5 mg daily and will have him follow-up after cardiac MRI.  Today he denies chest pain, shortness of breath, lower extremity edema, fatigue, palpitations, melena, hematuria, hemoptysis, diaphoresis, weakness, presyncope, syncope, orthopnea, and PND.    Home Medications    Prior to Admission medications   Medication Sig Start Date End Date Taking? Authorizing Provider  amiodarone  (PACERONE ) 200 MG tablet Take1 tablet (200 mg total) daily. 11/17/23   Barrett, Andra Kava, PA-C  apixaban  (ELIQUIS ) 5 MG TABS tablet Take 1 tablet (5 mg total) by mouth 2 (two) times daily. 10/25/23 11/24/23  Hoyt Macleod, MD  atorvastatin  (LIPITOR) 40 MG tablet Take 1 tablet (40 mg total) by mouth daily. 10/26/23   Hoyt Macleod, MD  Cholecalciferol  (VITAMIN D3) 50 MCG (2000 UT) TABS Take 2,000 Units by mouth daily after breakfast.    [provider]  isosorbide -hydrALAZINE  (BIDIL ) 20-37.5 MG tablet Take 1 tablet by mouth 3 (three) times daily. 11/17/23   Barrett, Andra Kava, PA-C  nitroGLYCERIN  (NITROSTAT ) 0.4 MG SL tablet Place 1 tablet (0.4 mg total) under the tongue every 5 (five) minutes x 3 doses as needed for chest pain. 10/25/23   Hoyt Macleod, MD  predniSONE (DELTASONE) 10 MG tablet Take 10 mg by mouth daily as needed (Gout flares).    [provider]  UNKNOWN TO PATIENT Place 1 drop into both eyes See admin instructions. Unnamed OTC eye drops (name possibly starts with "Med") for redness/dryness/itching : Instill 1 drop into both eyes up to three times a day as needed for dryness or  irritation    [provider]    Family History    Family History  Problem Relation Age of Onset   Heart disease Father        deceased   Stroke Father    Hypertension Mother        deceased   Stroke Mother    Heart disease Brother    Heart disease Sister        x 2 sister   Heart attack Brother    Stroke Brother    He indicated that his mother is deceased. He indicated that his father is deceased. He indicated that both of his sisters are deceased. He indicated that two of his three brothers are deceased.  Social History    Social History   Socioeconomic History   Marital status: Divorced    Spouse name: Not on file   Number of children: 2   Years of education: Not  on file   Highest education level: Not on file  Occupational History   Occupation: filter specialist  Tobacco Use   Smoking status: Never   Smokeless tobacco: Never  Vaping Use   Vaping status: Never Used  Substance and Sexual Activity   Alcohol use: Yes    Comment: occ wine   Drug use: No   Sexual activity: Not on file  Other Topics Concern   Not on file  Social History Narrative   Not on file   Social Drivers of Health   Financial Resource Strain: Not on file  Food Insecurity: No Food Insecurity (11/15/2023)   Hunger Vital Sign    Worried About Running Out of Food in the Last Year: Never true    Ran Out of Food in the Last Year: Never true  Transportation Needs: No Transportation Needs (11/15/2023)   PRAPARE - Administrator, Civil Service (Medical): No    Lack of Transportation (Non-Medical): No  Physical Activity: Not on file  Stress: Not on file  Social Connections: Moderately Integrated (11/15/2023)   Social Connection and Isolation Panel [NHANES]    Frequency of Communication with Friends and Family: More than three times a week    Frequency of Social Gatherings with Friends and Family: Twice a week    Attends Religious Services: More than 4 times per year    Active  Member of [employer] or Organizations: Yes    Attends Banker Meetings: 1 to 4 times per year    Marital Status: Divorced  Catering manager Violence: Not At Risk (11/15/2023)   Humiliation, Afraid, Rape, and Kick questionnaire    Fear of Current or Ex-Partner: No    Emotionally Abused: No    Physically Abused: No    Sexually Abused: No     Review of Systems    General:  No chills, fever, night sweats or weight changes.  Cardiovascular:  No chest pain, dyspnea on exertion, edema, orthopnea, palpitations, paroxysmal nocturnal dyspnea. Dermatological: No rash, lesions/masses Respiratory: No cough, dyspnea Urologic: No hematuria, dysuria Abdominal:   No nausea, vomiting, diarrhea, bright red blood per rectum, melena, or hematemesis Neurologic:  No visual changes, wkns, changes in mental status. All other systems reviewed and are otherwise negative except as noted above.  Physical Exam    VS:  BP (!) 152/70   Pulse 88   Ht 6' (1.829 m)   Wt 171 lb 12.8 oz (77.9 kg)   SpO2 99%   BMI 23.30 kg/m  , BMI Body mass index is 23.3 kg/m. GEN: Well nourished, well developed, in no acute distress. HEENT: normal. Neck: Supple, no JVD, carotid bruits, or masses. Cardiac: RRR, no murmurs, rubs, or gallops. No clubbing, cyanosis, edema.  Radials/DP/PT 2+ and equal bilaterally.  Respiratory:  Respirations regular and unlabored, clear to auscultation bilaterally. GI: Soft, nontender, nondistended, BS + x 4. MS: no deformity or atrophy. Skin: warm and dry, no rash. Neuro:  Strength and sensation are intact. Psych: Normal affect.  Accessory Clinical Findings    Recent Labs: 10/21/2023: Magnesium 2.4 10/24/2023: TSH 2.306 11/17/2023: BUN 22; Creatinine, Ser 2.71; Hemoglobin 9.9; Platelets 197; Potassium 4.9; Sodium 136   Recent Lipid Panel    Component Value Date/Time   CHOL 176 04/01/2017 1549   TRIG 94 04/01/2017 1549   HDL 61 04/01/2017 1549   CHOLHDL 2.9 04/01/2017 1549    LDLCALC 96 04/01/2017 1549    HYPERTENSION CONTROL Vitals:   11/28/23 1325 11/28/23 1351  BP: (!) 160/64 (!) 152/70    The patient's blood pressure is elevated above target today.  In order to address the patient's elevated BP: Blood pressure will be monitored at home to determine if medication changes need to be made.; A new medication was prescribed today.       ECG personally reviewed by me today- none today    Echocardiogram 10/22/2023  IMPRESSIONS     1. Left ventricular ejection fraction, by estimation, is 30 to 35%. The  left ventricle has moderately decreased function. The left ventricle  demonstrates regional wall motion abnormalities (see scoring  diagram/findings for description). Left ventricular   diastolic parameters are consistent with Grade II diastolic dysfunction  (pseudonormalization). The inferior septum, entire inferior wall, apical  anterior segment, and apex are hypokinetic.   2. Right ventricular systolic function is mildly reduced. The right  ventricular size is mildly enlarged. Tricuspid regurgitation signal is  inadequate for assessing PA pressure.   3. The mitral valve is normal in structure. Trivial mitral valve  regurgitation. No evidence of mitral stenosis.   4. The aortic valve is tricuspid. Aortic valve regurgitation is not  visualized. No aortic stenosis is present.   5. The inferior vena cava is normal in size with greater than 50%  respiratory variability, suggesting right atrial pressure of 3 mmHg.   Conclusion(s)/Recommendation(s): No left ventricular mural or apical  thrombus/thrombi.   FINDINGS   Left Ventricle: Left ventricular ejection fraction, by estimation, is 30  to 35%. The left ventricle has moderately decreased function. The left  ventricle demonstrates regional wall motion abnormalities. Definity   contrast agent was given IV to delineate  the left ventricular endocardial borders. The left ventricular internal  cavity  size was normal in size. There is no left ventricular hypertrophy.  Left ventricular diastolic parameters are consistent with Grade II  diastolic dysfunction  (pseudonormalization).     LV Wall Scoring:  The inferior septum, entire inferior wall, apical anterior segment, and  apex  are hypokinetic. The inferior septum, entire inferior wall, apical  anterior  segment, and apex are hypokinetic.   Right Ventricle: The right ventricular size is mildly enlarged. Right  vetricular wall thickness was not well visualized. Right ventricular  systolic function is mildly reduced. Tricuspid regurgitation signal is  inadequate for assessing PA pressure.   Left Atrium: Left atrial size was normal in size.   Right Atrium: Right atrial size was normal in size.   Pericardium: Trivial pericardial effusion is present.   Mitral Valve: The mitral valve is normal in structure. Trivial mitral  valve regurgitation. No evidence of mitral valve stenosis.   Tricuspid Valve: The tricuspid valve is normal in structure. Tricuspid  valve regurgitation is trivial. No evidence of tricuspid stenosis.   Aortic Valve: The aortic valve is tricuspid. Aortic valve regurgitation is  not visualized. No aortic stenosis is present. Aortic valve mean gradient  measures 7.0 mmHg. Aortic valve peak gradient measures 12.4 mmHg. Aortic  valve area, by VTI measures 2.76   cm.   Pulmonic Valve: The pulmonic valve was not well visualized. Pulmonic valve  regurgitation is not visualized. No evidence of pulmonic stenosis.   Aorta: The aortic root and ascending aorta are structurally normal, with  no evidence of dilitation.   Venous: The inferior vena cava is normal in size with greater than 50%  respiratory variability, suggesting right atrial pressure of 3 mmHg.   IAS/Shunts: The interatrial septum was not well visualized.   LHC 10/24/2023  Prox RCA lesion is 30% stenosed.   2nd Mrg lesion is 40% stenosed.   Prox  LAD to Mid LAD lesion is 30% stenosed.   Prox Cx to Dist Cx lesion is 20% stenosed.   1.  Mild nonobstructive coronary artery disease. 2.  Left ventricular angiography was not performed due to chronic kidney disease.  EF was moderately reduced by echo. 3.  Right heart catheterization showed normal right and left-sided filling pressures, mild pulmonary hypertension and normal cardiac output   RA: 8 mmHg, RV: 39/2 mmHg, PW: 10 mmHg, PA: 39/10 mmHg.  Cardiac output is 5.63 with an index of 2.82.   Recommendations: No clear culprit is identified for elevated troponin which is likely due to supply demand ischemia.  Possible underlying arrhythmia. Heparin  drip can be resumed 2 hours after TR band removal and a DOAC can be started tomorrow if needed. Only 15 mL of contrast was used for the procedure.       Assessment & Plan   1.  Bradycardia-heart rate today 84 bpm `.  Seen and evaluated by EP.  Symptoms felt to be related to blood pressure and not related to heart rate.  Cardiac MRI scheduled.  Continue to avoid beta-blocker therapy Proceed to cardiac MRI Follow-up with EP  Essential hypertension-BP today 152/70. Maintain blood pressure log Continue isosorbide , hydralazine , Heart healthy low-sodium diet Start amlodipine  2.5 mg daily  HFrEF-no increased shortness of breath.  Denies activity intolerance.  Denies heart failure type symptoms.  Weight stable.  EF 50-55% 2019.  Echocardiogram 10/22/2023 showed an LVEF of 30-35%, G2 DD, trivial mitral valve regurgitation.  Reduced EF may have been related to tachycardia mediated atrial flutter during 10/21/2023 to 10/25/2023 admission. Continue furosemide  Heart healthy low-sodium diet Daily weights  Coronary artery disease-no chest pain today.  Underwent cardiac catheterization 10/24/2023 which showed mild nonobstructive CAD Heart high-fiber diet Continue atorvastatin   Hyperlipidemia-LDL 133 on 06/19/2023. Continue atorvastatin  Increase  physical activity as tolerated  Atrial flutter-heart rate today 88 bpm.  Not a candidate for beta-blocker therapy. Continue amiodarone  Follow with EP   Disposition: Follow-up with Dr. Lawana Pray in 1 month and Dr. Alda Amas or me in 3-4 months.  Chet Cota. Orlanda Lemmerman NP-C     11/28/2023, 1:56 PM [facility] [address] Office [phone] Fax [phone]    I spent 15 minutes examining this patient, reviewing medications, and using patient centered shared decision making involving their cardiac care.   I spent  20 minutes reviewing past medical history,  medications, and prior cardiac tests.

## 2023-11-27 ENCOUNTER — Other Ambulatory Visit (HOSPITAL_COMMUNITY)

## 2023-11-28 ENCOUNTER — Ambulatory Visit: Attending: General Practice | Admitting: General Practice

## 2023-11-28 ENCOUNTER — Encounter: Payer: Self-pay | Admitting: *Deleted

## 2023-11-28 ENCOUNTER — Encounter: Payer: Self-pay | Admitting: General Practice

## 2023-11-28 VITALS — BP 152/70 | HR 88 | Ht 72.0 in | Wt 171.8 lb

## 2023-11-28 DIAGNOSIS — R001 Bradycardia, unspecified: Secondary | ICD-10-CM

## 2023-11-28 DIAGNOSIS — E782 Mixed hyperlipidemia: Secondary | ICD-10-CM

## 2023-11-28 DIAGNOSIS — I5020 Unspecified systolic (congestive) heart failure: Secondary | ICD-10-CM | POA: Diagnosis not present

## 2023-11-28 DIAGNOSIS — I10 Essential (primary) hypertension: Secondary | ICD-10-CM | POA: Diagnosis not present

## 2023-11-28 MED ORDER — AMLODIPINE BESYLATE 5 MG PO TABS
2.5000 mg | ORAL_TABLET | Freq: Every day | ORAL | 2 refills | Status: DC
Start: 2023-11-28 — End: 2024-03-17

## 2023-11-28 NOTE — Patient Instructions (Signed)
 Medication Instructions:  Your physician has recommended you make the following change in your medication:   START Amlodipine  2.5 mg taking 1 daily  *If you need a refill on your cardiac medications before your next appointment, please call your pharmacy*  Lab Work: None ordered  If you have labs (blood work) drawn today and your tests are completely normal, you will receive your results only by: MyChart Message (if you have MyChart) OR A paper copy in the mail If you have any lab test that is abnormal or we need to change your treatment, we will call you to review the results.  Testing/Procedures: None ordered  Follow-Up: At [facility], you and your health needs are our priority.  As part of our continuing mission to provide you with exceptional heart care, our providers are all part of one team.  This team includes your primary Cardiologist (physician) and Advanced Practice Providers or APPs (Physician Assistants and Nurse Practitioners) who all work together to provide you with the care you need, when you need it.  Your next appointment:   After 12/13/23  Provider:   Agatha Horsfall, MD    We recommend signing up for the patient portal called "MyChart".  Sign up information is provided on this After Visit Summary.  MyChart is used to connect with patients for Virtual Visits (Telemedicine).  Patients are able to view lab/test results, encounter notes, upcoming appointments, etc.  Non-urgent messages can be sent to your provider as well.   To learn more about what you can do with MyChart, go to [email or web address].   Other Instructions

## 2023-12-01 HISTORY — PX: OTHER SURGICAL HISTORY: SHX169

## 2023-12-12 ENCOUNTER — Telehealth: Payer: Self-pay | Admitting: General Practice

## 2023-12-12 NOTE — Telephone Encounter (Signed)
 Spoke with patient, he asked if he has a diagnosis of a-fib. Reviewed patient's chart and informed him it shows he a diagnosis if atrial flutter and bradycardia.  Patient states he is not able to afford cost of Eliquis  and has not had any since Saturday. Patient is asking if he needs to continue taking a blood thinner, and if so can a cheaper option be prescribed instead.  Will forward to Lawana Pray, NP to review and advise.

## 2023-12-12 NOTE — Telephone Encounter (Signed)
 Pt called in asking to speak with someone about his heart conditions. He asked if he has afib or not. Please advise.

## 2023-12-12 NOTE — Telephone Encounter (Signed)
 His insurance is commercial, so can use the Eliquis  copay card.   It will bring his price down to $10 per month, with a maximum savings of $6,400.  He can go to the website [email or web address] and click on the orange button at the top of the screen that says Request a copay card

## 2023-12-13 ENCOUNTER — Ambulatory Visit (HOSPITAL_COMMUNITY)
Admission: RE | Admit: 2023-12-13 | Discharge: 2023-12-13 | Disposition: A | Discharge status: Home / Self Care | Source: Ambulatory Visit | Attending: Cardiovascular Disease | Admitting: Cardiovascular Disease

## 2023-12-13 ENCOUNTER — Other Ambulatory Visit: Payer: Self-pay | Admitting: Cardiovascular Disease

## 2023-12-13 DIAGNOSIS — I517 Cardiomegaly: Secondary | ICD-10-CM

## 2023-12-13 DIAGNOSIS — I493 Ventricular premature depolarization: Secondary | ICD-10-CM

## 2023-12-13 DIAGNOSIS — I5042 Chronic combined systolic (congestive) and diastolic (congestive) heart failure: Secondary | ICD-10-CM

## 2023-12-13 MED ORDER — GADOBUTROL 1 MMOL/ML IV SOLN
7.0000 mL | Freq: Once | INTRAVENOUS | Status: AC | PRN
Start: 2023-12-13 — End: 2023-12-13

## 2023-12-13 NOTE — Telephone Encounter (Signed)
 Left message for patient with response from Pharm D:  His insurance is commercial, so can use the Eliquis  copay card.   It will bring his price down to $10 per month, with a maximum savings of $6,400.  He can go to the website [email or web address] [email or web address] and click on the orange button at the top of the screen that says Request a copay card    Provided office number for callback if any questions.

## 2023-12-16 ENCOUNTER — Ambulatory Visit: Payer: Self-pay | Admitting: Cardiovascular Disease

## 2023-12-16 DIAGNOSIS — R911 Solitary pulmonary nodule: Secondary | ICD-10-CM

## 2023-12-20 NOTE — Telephone Encounter (Signed)
-----   Message from Ahmad Alert sent at 12/16/2023  7:42 AM EDT ----- Normal LV systolic function. Mild LV dilatation, mild LVH No infiltrative process No evidence of myocardial scar Right lung nodule Please refer to pulmnary nodule cliinic He has a creatinine of 2.7.  will defer to pulmonary  medicine for follow up of his pulmonary nodule   ----- Message ----- From: Interface, Rad Results In Sent: 12/14/2023   3:30 PM EDT To: [place], MD

## 2023-12-20 NOTE — Telephone Encounter (Signed)
 Called and spoke with patient who agrees to plan. Order placed at this time for chest CT to follow lung nodule.

## 2023-12-24 ENCOUNTER — Telehealth: Payer: Self-pay | Admitting: General Practice

## 2023-12-24 NOTE — Telephone Encounter (Signed)
 Pt requesting cb to discuss if awaiting CT results before he can be cleared to return to work

## 2023-12-24 NOTE — Telephone Encounter (Signed)
 HR is low 41-46.  Checks daily on BP monitor. [place] he was told to stay out of work until the MRI was completed.  Requesting RETURN TO WORK NOTE He has been off from 11/12/23 when he went to the ER initially at [facility] in [place] and is still out.  Would like to return this coming Monday.  Needs it to say no restrictions but he doesn't really know how he will feel until he gets back to it.  Works at [employer], some lifting.

## 2023-12-25 ENCOUNTER — Encounter: Payer: Self-pay | Admitting: *Deleted

## 2023-12-25 NOTE — Telephone Encounter (Signed)
 Nahser, Aleene PARAS, MD to Me (Selected Message)    12/24/23  7:36 PM Thanks   Ok to write a note stating the dates he has requested.    Lauraine has already given my stamp so just type my name and let it go at that    Phil  ____________________________________________   Letter prepared and placed at [facility] reception desk.  Called patient and made aware.

## 2023-12-31 ENCOUNTER — Ambulatory Visit (HOSPITAL_BASED_OUTPATIENT_CLINIC_OR_DEPARTMENT_OTHER)
Admission: RE | Admit: 2023-12-31 | Discharge: 2023-12-31 | Disposition: A | Discharge status: Home / Self Care | Source: Ambulatory Visit | Attending: Cardiovascular Disease | Admitting: Cardiovascular Disease

## 2023-12-31 DIAGNOSIS — J479 Bronchiectasis, uncomplicated: Secondary | ICD-10-CM | POA: Diagnosis not present

## 2023-12-31 DIAGNOSIS — R091 Pleurisy: Secondary | ICD-10-CM | POA: Diagnosis not present

## 2023-12-31 DIAGNOSIS — R911 Solitary pulmonary nodule: Secondary | ICD-10-CM | POA: Insufficient documentation

## 2023-12-31 DIAGNOSIS — J929 Pleural plaque without asbestos: Secondary | ICD-10-CM | POA: Diagnosis not present

## 2024-01-13 ENCOUNTER — Other Ambulatory Visit: Payer: Self-pay | Admitting: Physician Assistant

## 2024-01-14 ENCOUNTER — Telehealth: Payer: Self-pay | Admitting: General Practice

## 2024-01-14 NOTE — Telephone Encounter (Signed)
 Called patient left message on personal voice mail to call back.

## 2024-01-14 NOTE — Telephone Encounter (Signed)
 Pt c/o medication issue:  1. Name of Medication: apixaban  (ELIQUIS ) 5 MG TABS tablet (Expired)   2. How are you currently taking this medication (dosage and times per day)? As written   3. Are you having a reaction (difficulty breathing--STAT)? No   4. What is your medication issue? Pts dentist told him to ask if he could stop this medication for a few days to manage the bleeding. Please advise

## 2024-01-22 ENCOUNTER — Ambulatory Visit: Payer: Self-pay | Admitting: Cardiovascular Disease

## 2024-01-22 NOTE — Telephone Encounter (Signed)
 Left message for pt to call.

## 2024-02-03 NOTE — Telephone Encounter (Signed)
 Left message for pt to call if is still having problems.

## 2024-02-04 ENCOUNTER — Ambulatory Visit: Attending: Cardiology | Admitting: Cardiology

## 2024-02-04 ENCOUNTER — Encounter: Payer: Self-pay | Admitting: Cardiology

## 2024-02-04 VITALS — BP 147/71 | HR 38 | Ht 72.0 in | Wt 179.0 lb

## 2024-02-04 DIAGNOSIS — I5032 Chronic diastolic (congestive) heart failure: Secondary | ICD-10-CM

## 2024-02-04 DIAGNOSIS — I130 Hypertensive heart and chronic kidney disease with heart failure and stage 1 through stage 4 chronic kidney disease, or unspecified chronic kidney disease: Secondary | ICD-10-CM

## 2024-02-04 DIAGNOSIS — I4892 Unspecified atrial flutter: Secondary | ICD-10-CM | POA: Diagnosis not present

## 2024-02-04 DIAGNOSIS — N184 Chronic kidney disease, stage 4 (severe): Secondary | ICD-10-CM

## 2024-02-04 DIAGNOSIS — R001 Bradycardia, unspecified: Secondary | ICD-10-CM

## 2024-02-04 DIAGNOSIS — E782 Mixed hyperlipidemia: Secondary | ICD-10-CM | POA: Diagnosis not present

## 2024-02-04 DIAGNOSIS — I10 Essential (primary) hypertension: Secondary | ICD-10-CM | POA: Diagnosis not present

## 2024-02-04 DIAGNOSIS — I1A0 Resistant hypertension: Secondary | ICD-10-CM | POA: Diagnosis not present

## 2024-02-04 DIAGNOSIS — I428 Other cardiomyopathies: Secondary | ICD-10-CM

## 2024-02-04 MED ORDER — AMIODARONE HCL 100 MG PO TABS: ORAL_TABLET | ORAL | Status: DC

## 2024-02-04 NOTE — Patient Instructions (Addendum)
 Other Instructions   Please Nephrologist when can you restart  ARB ( valsartan or Losartan  )   Medication Instructions:  Decrease Amiodarone  100 mg daily  *If you need a refill on your cardiac medications before your next appointment, please call your pharmacy*   Lab Work: Not needed    Testing/Procedures:  Not needed  Follow-Up: At [facility], you and your health needs are our priority.  As part of our continuing mission to provide you with exceptional heart care, we have created designated Provider Care Teams.  These Care Teams include your primary Cardiologist (physician) and Advanced Practice Providers (APPs -  Physician Assistants and Nurse Practitioners) who all work together to provide you with the care you need, when you need it.     Your next appointment:   Keep appointment  02/28/24  --with Terry Beauvais NP  The format for your next appointment:   In Person  Provider:    Dr [name]-- in 6 months  Other Instructions   Please Nephrologist when can you restart  ARB ( valsartan or Losartan  )

## 2024-02-04 NOTE — Progress Notes (Signed)
 Cardiology Office Note:  .   Date:  02/13/2024  ID:  [name], DOB [birth year], MRN [id number] PCP: Arloa Elsie JONELLE, MD  Nephrologist: Dr. [facility] Providers Cardiologist: -Seen by Dr. Kate in the hospital [name], MD Electrophysiologist:  [name], MD     Chief Complaint  Patient presents with   Follow-up    New patient for me.  Has been seen by Dr. Gerlene Passe for several years.  Was supposed to be seen by Dr. Lonni Kate who saw him in the hospital.   Cardiomyopathy    Has had labile cardiomyopathy with most recently EF by echo 30 to 35% then follow-up MRI normal EF.  No heart failure symptoms   Hypertension    Hypertensive heart disease    Patient Profile: .     Kelly R Beni Turrell. is a 68 y.o. male with a PMH notable for mild to be hypertensive heart and renal disease (NICM, CKD stage IV with new diagnosis of atrial flutter) reviewed below who presents here for 93-month follow-up.   Referred provider: Arloa Elsie JONELLE, MD.; former Cardiologist Dr. Aleene Passe  Nonischemic Cardiomyopathy:-By chart lore - listed as that CHF Myoview  2009: EF 40% Echo 2016 EF 25 to 30%-normalized by July 2019-EF to 55% Echo April 2025: EF 35% with inferior hypokinesis.  Cardiac cath with nonobstructive CAD. New Diagnosis of Atrial Flutter History of PVCs/NSVT Resistant HTN CKD Stage IV. Prostate cancer  Donna's last seen by Dr. Passe on September 27, 2023.  Noted is very active.  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  He is 1 of 12 siblings and only he and his sister do not have a pacemaker.  They planned to check a cardiac MRI to reassess his cardiac function.   Treyvone R Zamarripa Sr. was admitted on October 25, 2023 with a NSTEMI-troponins up to.252.  (The presenting symptom was chest pain off and on for 2 days with lightheadedness and vision changes.  He then has syncopal episode while bending over.  On arrival he heart rate was  in the 40s and BP in the 130s.   He developed Rapid Atrial Flutter -- started on amiodarone  and apixaban .  No beta-blocker because of chronic bradycardia.  EF was 35% with GR 2 DD.  Cardiac cath showed nonobstructive disease--elevated troponin probably related to demand ischemia in the setting of CKD and low EF.SABRA  Potentially related to arrhythmia.. Not a candidate for MRA or SGLT2 inhibitor  He was Transferred from [facility] on 11/16/2023 for lightheadedness and dizziness with accelerated hypertension-BP 216/97.  Heart rates were in the 30s to 40s therefore amiodarone  held prior to transfer to [facility] for PPM/CRT-D. => Seen by Dr. Norton from EP and it was felt that his symptoms are not related to bradycardia but more related to hypertension.  Imdur  and hydralazine  increased and losartan /HCTZ held.  Continued on amiodarone  and Eliquis .   He was was last seen on 11/28/2023 by Josefa Beauvais, NP.  He said he started exercising again and feeling better.  He was jumping ropes and doing pull-ups and dumbbells.  Exercised for about 20 minutes at a time.  BP on arrival was 160/64 and on recheck 150/70.  Amlodipine  2.5 mg prescribed with plans to follow-up with his cardiac MRI.  No notable cardiac symptoms.  No heart failure symptoms.  He was seen at [facility] physicians on 01/23/2024: Noted to have added low-dose HCTZ the previous visit and this time  they stopped HCTZ and converted to doxazosin 1 mg daily.  This was subsequently increased to 2 mg daily as of August 1.  Subjective  Discussed the use of AI scribe software for clinical note transcription with the patient, who gave verbal consent to proceed.  History of Present Illness  Terry R Cuccaro Sr. Todd is a 68 year old male with atrial flutter,hypertension, and nonischemic cardiomyopathy who presents for follow-up after recent emergency room visits for cardiac issues.  No current issues with shortness of breath, chest pain, or pressure.  He recalls an episode of chest pain about two to three months ago, leading to an emergency room visit and subsequent heart catheterization in late April. The catheterization showed no significant blockages. An MRI performed two months later showed improved left ventricular function.  He recalled his episode of RapidAtrial Flutter in April, leading to the initiation of amiodarone  and Eliquis . No history of atrial fibrillation or flutter prior to this. Another emergency room visit on May 17th for slow heart rate and high blood pressure, with a blood pressure reading of 216/97 mmHg. Discharged the following day.  Current medications include amiodarone  200 mg, amlodipine  10 mg, hydralazine  (Bidil ) two tablets three times a day, atorvastatin  40 mg, furosemide  40 mg twice a day, doxazosin 2 mg, and hydrochlorothiazide  12.5 mg. Medication regimen adjusted following emergency room visits.  No recent episodes of dizziness, syncope, or palpitations. Reports fatigue upon exertion. Headaches resembling sinus headaches, not constant. No blurred vision, sudden weakness, numbness, dysarthria, or sialorrhea.  Excessive sweating on the left side during physical activity. No bleeding issues, hematochezia, or nausea. He reports that he watches for signs of bleeding when using the bathroom due to his blood thinner use.  Cardiovascular ROS: no chest pain or dyspnea on exertion positive for - exertional fatigue, sinus headaches.  Unilateral swelling negative for - chest pain, edema, irregular heartbeat, orthopnea, palpitations, paroxysmal nocturnal dyspnea, rapid heart rate, shortness of breath, or syncope or near TIA or amaurosis fugax, claudication  ROS:  Review of Systems - Negative except symptoms noted above.  No melena, hematochezia hematuria or epistaxis.   Objective   Current Meds  Medication Sig   amiodarone  (PACERONE ) 200 MG tablet TAKE 1 TABLET(200 MG) BY MOUTH DAILY   amLODipine  (NORVASC ) 5 MG tablet  Take 0.5 tablets (2.5 mg total) by mouth daily.   apixaban  (ELIQUIS ) 5 MG TABS tablet Take 1 tablet (5 mg total) by mouth 2 (two) times daily.   atorvastatin  (LIPITOR) 40 MG tablet Take 1 tablet (40 mg total) by mouth daily.   Cholecalciferol  (VITAMIN D3) 50 MCG (2000 UT) TABS Take 2,000 Units by mouth daily after breakfast.   doxazosin (CARDURA) 1 MG tablet 1 tablet Orally Once a day   furosemide  (LASIX ) 40 MG tablet Take 40 mg by mouth 3 (three) times daily.   isosorbide -hydrALAZINE  (BIDIL ) 20-37.5 MG tablet Take 1 tablet by mouth 3 (three) times daily.   nitroGLYCERIN  (NITROSTAT ) 0.4 MG SL tablet Place 1 tablet (0.4 mg total) under the tongue every 5 (five) minutes x 3 doses as needed for chest pain.   predniSONE (DELTASONE) 10 MG tablet Take 10 mg by mouth daily as needed (Gout flares).   UNKNOWN TO PATIENT Place 1 drop into both eyes See admin instructions. Unnamed OTC eye drops (name possibly starts with Med) for redness/dryness/itching : Instill 1 drop into both eyes up to three times a day as needed for dryness or irritation   Hydrochlorothiazide  12.5 mg Take 1  tab daily   SH: Never smoked.  He does drink alcohol but no illicit drugs. FH: Brother MI.  Father brother and sister have coronary disease.  Mother had hypertension.  Father brother and mother had stroke.  10 siblings with pacemakers  PSH: Left knee arthroscopy (01/2022); right elbow surgery (05/2018); laparoscopic radical prostatectomy (Dr. Kirby)  Studies Reviewed: SABRA   EKG Interpretation Date/Time:  Tuesday February 04 2024 13:52:22 EDT Ventricular Rate:  44 PR Interval:  200 QRS Duration:  154 QT Interval:  508 QTC Calculation: 434 R Axis:   -46  Text Interpretation: Marked sinus bradycardia with Premature atrial complexes Left bundle branch block When compared with ECG of 16-Nov-2023 21:15, Sinus rhythm has replaced Ectopic atrial rhythm Left bundle branch block is now Present Confirmed by Anner Lenis ([id number])  on 02/04/2024 2:22:03 PM   Labs from [facility] (01/31/2024): Cr 2.71 (eGFR 25); (02/03/2024) Hgb 9.8 Cr 2.71 (01/23/2024): TSH 2.37, ACE GB 9.8; BUN 33, cR 2.71, K+ 4.9.  Normal LFTs.  (As a 12/09/2023, Cr 2.77) (06/20/2023): TC 199, TG 74, HDL 50, LDL 133. Lab Results  Component Value Date   NA 136 11/17/2023   K 4.9 11/17/2023   CREATININE 2.71 (H) 11/17/2023   GFRNONAA 25 (L) 11/17/2023   GLUCOSE 82 11/17/2023   Results DIAGNOSTIC Cardiac catheterization: Mild, nonobstructive CAD (prox RCA 30%, prox-mid LAD 30%, prox-dist LCx 20%, OM2 40%). RHC: PAP 39/10; PCWP 10.  CO 5.6, CI 2.82 - Normal (10/21/2023) Echocardiogram: LVEF 30-35%, RWMA: Inferior septum, entire inferior wall, anterior apical and apex hypokinesis. GR 2 DD.  Mild enlarged RV unable to measure PAP/RVSP.  Normal AoV and MV.  Normal RAP.  No LV mural or apical thrombus.  (10/2023) Echo 01/13/2018: EF 50 and 55%.  Moderate LVH.  Moderate LA dilation.  Mild reduction.  Dominance: Right        Echo RWMA  RADIOLOGY Cardiac MRI: LVEF ~60%, mildly dilated LV, mild LVH.  Normal RV.  RVEF 64%.  No evidence of scar.  Right lung nodule noted.  (12/2023)  Risk Assessment/Calculations:    CHA2DS2-VASc Score = 3   This indicates a 3.2% annual risk of stroke. The patient's score is based upon: CHF History: 1 HTN History: 1 Diabetes History: 0 Stroke History: 0 Vascular Disease History: 0 Age Score: 1 Gender Score: 0          Physical Exam:   VS:  BP (!) 147/71 (BP Location: Right Arm, Patient Position: Standing) Comment (Patient Position): at 3 minutes  Pulse (!) 38   Ht 6' (1.829 m)   Wt 179 lb (81.2 kg)   SpO2 97%   BMI 24.28 kg/m    Orthostatic vitals:              BP (HR)  Supine 149/76 (39 )  Seated 151/75 (41 )  Standing #1 151/79 (41)  Standing #2 (at 3 min) 147/71 (38)   Wt Readings from Last 3 Encounters:  02/04/24 179 lb (81.2 kg)  11/28/23 171 lb 12.8 oz (77.9 kg)  11/17/23 168 lb 14.4 oz (76.6 kg)     GEN: Well nourished, well groomed in no acute distress; healthy-appearing NECK: No JVD; No carotid bruits CARDIAC: Normal S1, S2; RRR, no murmurs, rubs, gallops RESPIRATORY:  Clear to auscultation without rales, wheezing or rhonchi ; nonlabored, good air movement. ABDOMEN: Soft, non-tender, non-distended EXTREMITIES:  No edema; No deformity     ASSESSMENT AND PLAN: .    Problem List Items  Addressed This Visit       Cardiology Problems   Hypertensive heart disease with diastolic heart failure and stage 4 chronic kidney disease (HCC) - Primary (Chronic)   Unusual sequence of events very he has these episodes where his EF drops and then it goes back to baseline normal.  I suspect that the most recent episode in April was related to his atrial flutter and hypertension.  Troponin elevation was probably related to strain and demand ischemia as there was no evidence of CAD.  Interestingly, his cardiac MRI now shows EF of 60% and he has no active CHF symptoms. His blood pressure is elevated but not rapidly elevated today.  He denies any PND, or orthopnea but does note mild occasional swelling.  He initially did not feel well with taking furosemide  but is now taking furosemide  40 mg twice daily. ARB was discontinued in the hospital because of his CKD and he was started on BiDil  which he takes 2 tabs 3 times daily.  It recently has been titrated up to a 10 mg amlodipine  and 2 mg Cardura daily.  Currently NYHA class I-II symptoms of CHF and it would be more of a hypertensive heart disease related diastolic dysfunction since his echo did show grade 2 diastolic dysfunction. BP still not at goal.  Continue current hypertensives, but will ask that he discuss potentially reinitiating his ARB or potentially even Entresto with his nephrologist at next visit.  No room to titrate existing medications any further. Continue amlodipine  10 mg daily, BiDil  20-37.52 tabs 3 times daily Not on beta-blocker because  of bradycardia issues As he had been placed on Cardura up to 2 mg daily. Will initiate low-dose HCTZ 12.5 mg daily.  He has been on that in the past.  Continue furosemide  currently taking 40 mg twice daily - Monitor BP; if systolic >135 mmHg, take additional hydralazine -isosorbide  dinitrate. - Follow up with nephrologist at month's end.       Relevant Medications   doxazosin (CARDURA) 1 MG tablet   amLODipine  (NORVASC ) 10 MG tablet   hydrochlorothiazide  (MICROZIDE ) 12.5 MG capsule   amiodarone  (PACERONE ) 100 MG tablet   Other Relevant Orders   LONG TERM MONITOR (3-14 DAYS)   Nonischemic cardiomyopathy (HCC) (Chronic)   Intermittent episodes of low versus normal EF.  Most recent echo in April 2025 in the setting of elevated troponin and atrial flutter-likely demand ischemia based on normal coronaries.  Follow-up evaluation with cardiac MRI which showed no evidence of infarction or MRI.  Suggestion will be if this would been transient myocarditis, tachycardia mediated due to rapid atrial flutter leading to demand ischemia. Other option would be hypertensive heart disease (labile and easily injured leading to temporary decrease in EF)  -Treatment will be to manage blood pressure and avoid rapid atrial flutter.      Relevant Medications   doxazosin (CARDURA) 1 MG tablet   amLODipine  (NORVASC ) 10 MG tablet   hydrochlorothiazide  (MICROZIDE ) 12.5 MG capsule   amiodarone  (PACERONE ) 100 MG tablet   Other Relevant Orders   LONG TERM MONITOR (3-14 DAYS)   Paroxysmal atrial flutter (HCC) (Chronic)   He is currently maintaining sinus bradycardia with rate of 38 here today.  Not overly symptomatic.  This seems to be a chronic issue for him.  He is on amiodarone  200 mg daily. No bleeding on Eliquis . - Reduce amiodarone  to 100 mg daily. - Monitor heart rate; if >100 bpm, take additional half dose of amiodarone .  - After 1  week, we will contact the patient to arrange 1 week Zio patch monitor  prior to follow-up visit with Josefa Beauvais - Continue Eliquis  currently at 5 mg twice daily for stroke prevention.  (Will review with pharmacy team to determine if this department dosing based on his CKD)      Relevant Medications   doxazosin (CARDURA) 1 MG tablet   amLODipine  (NORVASC ) 10 MG tablet   hydrochlorothiazide  (MICROZIDE ) 12.5 MG capsule   amiodarone  (PACERONE ) 100 MG tablet   Other Relevant Orders   LONG TERM MONITOR (3-14 DAYS)   Resistant hypertension (Chronic)   BP still elevated despite being on 10 mg amlodipine , BiDil  2 tabs 3 times daily (totaling 40-75 mg 3 times daily), Cardura 2 mg daily and furosemide  40 mg twice daily.  --> With his tendency to have reduced EF, Entresto would be a great option but would need approval by nephrology. Will now start low-dose HCTZ but will need chemistry closely. --> He would also benefit from carvedilol , but with heart rates in the 40s, we do not have room for beta-blocker.  (If he does end up with a PPM, we could then place him back on beta-blocker.      Relevant Medications   doxazosin (CARDURA) 1 MG tablet   amLODipine  (NORVASC ) 10 MG tablet   hydrochlorothiazide  (MICROZIDE ) 12.5 MG capsule   amiodarone  (PACERONE ) 100 MG tablet   Other Relevant Orders   EKG 12-Lead (Completed)   LONG TERM MONITOR (3-14 DAYS)     Other   Bradycardia (Chronic)   Likely sick sinus syndrome. - Reduce amiodarone  dose to 100 mg daily - Arrange for outpatient Zio patch monitor to be sent to the patient to wear prior to follow-up visit. The plan was for him to follow-up with Dr. Inocencio from EP. => PPM not placed during hospitalization because symptoms were felt to be related to hypertension.  Now his blood pressure is better and heart rate still slow.  The fact he also has atrial flutter and significant hypertension that would benefit from beta-blocker may add credence to the need for potentially even leadless pacer.  -> At follow-up visit, would  recommend following through with referral to EP in the outpatient setting-Dr. Inocencio      Relevant Orders   LONG TERM MONITOR (3-14 DAYS)   Other Visit Diagnoses       Mixed hyperlipidemia       Relevant Medications   doxazosin (CARDURA) 1 MG tablet   amLODipine  (NORVASC ) 10 MG tablet   hydrochlorothiazide  (MICROZIDE ) 12.5 MG capsule   amiodarone  (PACERONE ) 100 MG tablet   Other Relevant Orders   EKG 12-Lead (Completed)     Chronic diastolic heart failure (HCC)       Relevant Medications   doxazosin (CARDURA) 1 MG tablet   amLODipine  (NORVASC ) 10 MG tablet   hydrochlorothiazide  (MICROZIDE ) 12.5 MG capsule   amiodarone  (PACERONE ) 100 MG tablet   Other Relevant Orders   EKG 12-Lead (Completed)             Follow-Up: Return in about 24 days (around 02/28/2024) for Follow-up with APP, Alternate 6 month follow-up with APP & MD.  Total time spent: 25 min spent with patient + 53 min spent reviewing chart/data, and charting = 78 min I spent 10 the 8 minutes in the care of Fitzgerald R Luckenbach Sr. today including reviewing labs (1 minute), reviewing outside labs from [place] (2 minutes), reviewing studies (cardiac cath films reviewed along with echocardiogram images 8  minutes; cardiac MRI review results reviewed 2 minutes, prior echo reports reviewed 3 minutes-total 13 minutes), face to face time discussing treatment options (25 minutes), reviewing records from several clinic visits from Dr. Alveta, APP visits, [facility] physicians visits as well as 2 different hospitalizations H&P/discharge summaries and progress notes (20 minutes), 17 minutes dictating, and documenting in the encounter. Patient is new to me.  He has a complex medical history with 2 recent hospitalizations.  Multiple studies and notes to be reviewed.  This is likely an underestimation of the time spent in the chart.    Signed, Alm MICAEL Clay, MD, MS [name], M.D., M.S. Interventional Chartered certified accountant   Pager # [phone]

## 2024-02-13 ENCOUNTER — Encounter: Payer: Self-pay | Admitting: Cardiology

## 2024-02-13 DIAGNOSIS — I428 Other cardiomyopathies: Secondary | ICD-10-CM | POA: Insufficient documentation

## 2024-02-13 DIAGNOSIS — I130 Hypertensive heart and chronic kidney disease with heart failure and stage 1 through stage 4 chronic kidney disease, or unspecified chronic kidney disease: Secondary | ICD-10-CM | POA: Insufficient documentation

## 2024-02-13 NOTE — Assessment & Plan Note (Signed)
 He is currently maintaining sinus bradycardia with rate of 38 here today.  Not overly symptomatic.  This seems to be a chronic issue for him.  He is on amiodarone  200 mg daily. No bleeding on Eliquis . - Reduce amiodarone  to 100 mg daily. - Monitor heart rate; if >100 bpm, take additional half dose of amiodarone .  - After 1 week, we will contact the patient to arrange 1 week Zio patch monitor prior to follow-up visit with Josefa Beauvais - Continue Eliquis  currently at 5 mg twice daily for stroke prevention.  (Will review with pharmacy team to determine if this department dosing based on his CKD)

## 2024-02-13 NOTE — Assessment & Plan Note (Addendum)
 Likely sick sinus syndrome. - Reduce amiodarone  dose to 100 mg daily - Arrange for outpatient Zio patch monitor to be sent to the patient to wear prior to follow-up visit. The plan was for him to follow-up with Dr. Inocencio from EP. => PPM not placed during hospitalization because symptoms were felt to be related to hypertension.  Now his blood pressure is better and heart rate still slow.  The fact he also has atrial flutter and significant hypertension that would benefit from beta-blocker may add credence to the need for potentially even leadless pacer.  -> At follow-up visit, would recommend following through with referral to EP in the outpatient setting-Dr. Inocencio

## 2024-02-13 NOTE — Assessment & Plan Note (Addendum)
 BP still elevated despite being on 10 mg amlodipine , BiDil  2 tabs 3 times daily (totaling 40-75 mg 3 times daily), Cardura 2 mg daily and furosemide  40 mg twice daily.  --> With his tendency to have reduced EF, Entresto would be a great option but would need approval by nephrology. Will now start low-dose HCTZ but will need chemistry closely. --> He would also benefit from carvedilol , but with heart rates in the 40s, we do not have room for beta-blocker.  (If he does end up with a PPM, we could then place him back on beta-blocker.

## 2024-02-13 NOTE — Assessment & Plan Note (Signed)
 Intermittent episodes of low versus normal EF.  Most recent echo in April 2025 in the setting of elevated troponin and atrial flutter-likely demand ischemia based on normal coronaries.  Follow-up evaluation with cardiac MRI which showed no evidence of infarction or MRI.  Suggestion will be if this would been transient myocarditis, tachycardia mediated due to rapid atrial flutter leading to demand ischemia. Other option would be hypertensive heart disease (labile and easily injured leading to temporary decrease in EF)  -Treatment will be to manage blood pressure and avoid rapid atrial flutter.

## 2024-02-13 NOTE — Assessment & Plan Note (Addendum)
 Unusual sequence of events very he has these episodes where his EF drops and then it goes back to baseline normal.  I suspect that the most recent episode in April was related to his atrial flutter and hypertension.  Troponin elevation was probably related to strain and demand ischemia as there was no evidence of CAD.  Interestingly, his cardiac MRI now shows EF of 60% and he has no active CHF symptoms. His blood pressure is elevated but not rapidly elevated today.  He denies any PND, or orthopnea but does note mild occasional swelling.  He initially did not feel well with taking furosemide  but is now taking furosemide  40 mg twice daily. ARB was discontinued in the hospital because of his CKD and he was started on BiDil  which he takes 2 tabs 3 times daily.  It recently has been titrated up to a 10 mg amlodipine  and 2 mg Cardura daily.  Currently NYHA class I-II symptoms of CHF and it would be more of a hypertensive heart disease related diastolic dysfunction since his echo did show grade 2 diastolic dysfunction. BP still not at goal.  Continue current hypertensives, but will ask that he discuss potentially reinitiating his ARB or potentially even Entresto with his nephrologist at next visit.  No room to titrate existing medications any further. Continue amlodipine  10 mg daily, BiDil  20-37.52 tabs 3 times daily Not on beta-blocker because of bradycardia issues As he had been placed on Cardura up to 2 mg daily. Will initiate low-dose HCTZ 12.5 mg daily.  He has been on that in the past.  Continue furosemide  currently taking 40 mg twice daily - Monitor BP; if systolic >135 mmHg, take additional hydralazine -isosorbide  dinitrate. - Follow up with nephrologist at month's end.

## 2024-02-14 ENCOUNTER — Other Ambulatory Visit: Payer: Self-pay | Admitting: Cardiology

## 2024-02-14 ENCOUNTER — Encounter (INDEPENDENT_AMBULATORY_CARE_PROVIDER_SITE_OTHER): Payer: Self-pay | Admitting: Ophthalmology

## 2024-02-14 ENCOUNTER — Ambulatory Visit: Attending: Cardiology

## 2024-02-14 ENCOUNTER — Encounter: Payer: Self-pay | Admitting: *Deleted

## 2024-02-14 DIAGNOSIS — H3562 Retinal hemorrhage, left eye: Secondary | ICD-10-CM

## 2024-02-14 DIAGNOSIS — I1A0 Resistant hypertension: Secondary | ICD-10-CM

## 2024-02-14 DIAGNOSIS — H35033 Hypertensive retinopathy, bilateral: Secondary | ICD-10-CM | POA: Diagnosis not present

## 2024-02-14 DIAGNOSIS — I130 Hypertensive heart and chronic kidney disease with heart failure and stage 1 through stage 4 chronic kidney disease, or unspecified chronic kidney disease: Secondary | ICD-10-CM

## 2024-02-14 DIAGNOSIS — H2513 Age-related nuclear cataract, bilateral: Secondary | ICD-10-CM

## 2024-02-14 DIAGNOSIS — I428 Other cardiomyopathies: Secondary | ICD-10-CM

## 2024-02-14 DIAGNOSIS — I4892 Unspecified atrial flutter: Secondary | ICD-10-CM

## 2024-02-14 DIAGNOSIS — E782 Mixed hyperlipidemia: Secondary | ICD-10-CM

## 2024-02-14 DIAGNOSIS — R001 Bradycardia, unspecified: Secondary | ICD-10-CM

## 2024-02-14 DIAGNOSIS — I5032 Chronic diastolic (congestive) heart failure: Secondary | ICD-10-CM

## 2024-02-14 DIAGNOSIS — H43813 Vitreous degeneration, bilateral: Secondary | ICD-10-CM | POA: Diagnosis not present

## 2024-02-14 DIAGNOSIS — I10 Essential (primary) hypertension: Secondary | ICD-10-CM | POA: Diagnosis not present

## 2024-02-14 NOTE — Progress Notes (Unsigned)
 Enrolled for [employer] to mail a ZIO XT long term holter monitor to the patients address on file.   Patient to apply monitor 7 days after decreasing Amiodarone  to 100 mg daily.  If heart rate greater than 100 bpm, take an additional half dose of Amiodarone .  Letter with instructions mailed to patient.  Dr. Anner to read.

## 2024-02-20 ENCOUNTER — Telehealth (HOSPITAL_COMMUNITY): Payer: Self-pay | Admitting: Pharmacy Technician

## 2024-02-20 NOTE — Telephone Encounter (Addendum)
 Auth Submission: NO AUTH NEEDED Site of care: [facility] Payer: [employer] (comm), [employer] Medicare Medication & CPT/J Code(s) submitted: Venofer (Iron Sucrose) J1756 Diagnosis Code: D63.1 Route of submission (phone, fax, portal):  Phone # Fax # Auth type: Buy/Bill HB Units/visits requested: 200mg  x 1 dose Reference number: [id number] Approval from: 02/20/24 to 05/22/24  [facility]   [employer] Medicare    Dagoberto Armour, CPhT [facility] Phone: [phone] 02/20/2024

## 2024-02-26 ENCOUNTER — Other Ambulatory Visit (HOSPITAL_COMMUNITY): Payer: Self-pay

## 2024-02-26 NOTE — Progress Notes (Unsigned)
 Cardiology Clinic Note   Patient Name: Terry STRAKA Sr. Date of Encounter: 02/28/2024  Primary Care Provider:  Arloa Elsie JONELLE, MD Primary Cardiologist:  [name], MD  Patient Profile    Terry JONELLE Munroe Sr. 68 year old male presents to the clinic today for follow-up evaluation of his essential hypertension, chronic systolic CHF and near syncope.  Past Medical History    Past Medical History:  Diagnosis Date   Anemia    Cancer (HCC) dx dec 2019   prostate   Chronic kidney disease (CKD), active medical management without dialysis, stage 4 (severe) (HCC)    Creatinine range 2.7-0.8   Hypertensive heart disease with CHF (congestive heart failure) (HCC)    Alternating episodes of cardiomyopathy with a EF in the 30% range.  Echo in April 2025 EF 30 to 35%.  Currently cardiac MRI shows EF 60% (June 2025)   NSVT (nonsustained ventricular tachycardia) (HCC)    History of.   Paroxysmal atrial flutter (HCC) 10/2023   Now on amiodarone  and Eliquis    Pneumonia 1993   Sinus complaint    Past Surgical History:  Procedure Laterality Date   Cardiac MRI  12/2023   LVEF ~60%, mildly dilated LV, mild LVH.  Normal RV.  RVEF 64%.  No evidence of scar.  Right lung nodule noted.   ELBOW SURGERY Right 05/2018   PELVIC LYMPH NODE DISSECTION Bilateral 11/14/2018   Procedure: PELVIC LYMPH NODE DISSECTION;  Surgeon: Cam Morene ORN, MD;  Location: [facility];  Service: Urology;  Laterality: Bilateral;   pleurisy  1993   Right lung surgery    PROSTATE BIOPSY     RIGHT/LEFT HEART CATH AND CORONARY ANGIOGRAPHY N/A 10/24/2023   Procedure: RIGHT/LEFT HEART CATH AND CORONARY ANGIOGRAPHY;  Surgeon: Darron Deatrice LABOR, MD;  Location: [facility];  Mild, nonobstructive CAD (prox RCA 30%, prox-mid LAD 30%, prox-dist LCx 20%, OM2 40%). RHC: PAP 39/10; PCWP 10.  CO 5.6, CI 2.82 - Normal   ROBOT ASSISTED LAPAROSCOPIC RADICAL PROSTATECTOMY N/A 11/14/2018   Procedure: XI ROBOTIC ASSISTED  LAPAROSCOPIC RADICAL PROSTATECTOMY;  Surgeon: Cam Morene ORN, MD;  Location: [facility];  Service: Urology;  Laterality: N/A;   TRANSTHORACIC ECHOCARDIOGRAM  10/2023   Echo 01/13/2018: EF 50 and 55%.  Moderate LVH.  Moderate LA dilation.  Mild reduction.;; *10/2023: LVEF 30-35%, RWMA: Inferior septum, entire inferior wall, anterior apical and apex hypokinesis. GR 2 DD.  Mild enlarged RV unable to measure PAP/RVSP.  Normal AoV and MV.  Normal RAP.  No LV mural or apical thrombus.    Allergies  Allergies  Allergen Reactions   Cefaclor Other (See Comments)    Hallucinations   Other Other (See Comments)    Deconamine was a brand name medication that contains two different drugs, an antihistamine (chlorpheniramine) and a decongestant (pseudoephedrine) = Hallucinations    History of Present Illness    Terry R Gikas Sr. has a PMH of bradycardia, chronic systolic CHF, HTN, NICM, stage III CKD, prostate cancer status post prostatectomy, and NSVT.  EF 2009 showed an EF of 40% via stress test, echocardiogram 2016 showed LVEF of 25-30% (unable to get cardiac catheterization due to CKD), his EF improved to 50-55% in 2019.  He was seen in follow-up by Dr. Alveta 3/25.  During that time he continued to be very active.  He was having some knee pain.  He complained of gout symptoms.  He noted that he is 1 of 12 children in his family.  His older sister is the only person who does not have a pacemaker.  He was admitted on 10/21/2023 and discharged on 10/25/2023 with NSTEMI.  His cardiac troponins peaked at 252.  He underwent cardiac catheterization which showed no obstructive coronary disease.  His EF was noted to be 35% with G2 DD on echocardiogram.  He had an episode of atrial flutter with RVR and was started on amiodarone  and apixaban .  Due to his chronic bradycardia he was not placed on beta-blocker therapy.  He was transferred from [facility] 11/16/2023 for feeling lightheaded,  diaphoretic and he was noted to have significantly elevated blood pressure in the 216/97 range.  At [facility] he was noted to have a heart rate of 30s-40s.  His amiodarone  was held and he was transferred to [facility] for consideration of CRT-D.  EP consult was placed.  At [facility] his symptoms resolved.  He was monitored on telemetry.  He was seen by Dr. Inocencio 11/17/2023.  It was felt that he was minimally symptomatic with his bradycardia.  It was felt that his dizziness may have been related to blood pressure changes.  His blood pressure medications (Imdur  and hydralazine  had been increased for further blood pressure control.  He was continued on amiodarone  and Eliquis .  Due to his renal function his losartan  and HCTZ were placed on hold.  He was discharged and instructed to follow-up with cardiology as an outpatient.  Cardiac MRI scheduled for 12/13/2023.  He presented to the clinic 11/28/23 for follow-up evaluation and stated he had started to exercise again.  He did jump rope, Pullups, and used his dumbbells today.  He worked out for about 20 minutes.  He felt fine with this.  We reviewed his recent emergency department visit and recommendations for cardiac MRI.  He expressed understanding.  Initially his blood pressure was 160/64 and on recheck was 152/70.  I encouraged him to continue to eat a heart healthy low-sodium diet, prescribed amlodipine  2.5 mg daily and planned follow-up after cardiac MRI.  He was seen in follow-up by Dr. Anner 02/04/2024.  During that time he he had been referred back by [facility] physicians.  HCTZ had been added to his medication regimen then stopped.  He had been started on doxazosin 1 mg daily.  His doxazosin was increased to 2 mg daily on 01/31/2024.  He was taking amlodipine  10 mg daily, BiDil  20-37.53 times daily as well.  Dr. Anner started HCTZ 12.5 mg daily.  He was also continued on furosemide  40 mg daily.  His cardiac MRI showed no evidence of infarction.   It was felt that he had transient myocarditis and tachycardia mediated atrial fibrillation/flutter leading to demand ischemia.  It was felt that managing his blood pressure and avoiding elevated heart rate would be an appropriate course of management/treatment.  He was given a cardiac event monitor.  Cardiac event monitor has not yet resulted.  He presents to the clinic today for follow-up evaluation and states he has been staying physically active.  He continues to do his morning calisthenics and is looking forward to getting back to playing basketball and jogging.  We reviewed his cardiac MRI.  He reports that when he was in his 30s he had a significant case of pneumonia that required a 7-hour surgery.  He was monitored every 6 to 12 months postoperatively.  He believes the lung nodule may be related to this.  He will follow-up with his PCP.  His  blood pressure today is initially 151/70 on recheck is 140/60.  He reports he had an iron  infusion yesterday and is feeling much better today.  I will restart his losartan  50 mg daily, order bmet today and bmet in 1 week.  We will have him continue heart healthy diet, iron  rich diet, maintain physical activity and plan follow-up in 4 to 6 months.  Today he denies chest pain, shortness of breath, lower extremity edema, fatigue, palpitations, melena, hematuria, hemoptysis, diaphoresis, weakness, presyncope, syncope, orthopnea, and PND.    Home Medications    Prior to Admission medications   Medication Sig Start Date End Date Taking? Authorizing Provider  amiodarone  (PACERONE ) 200 MG tablet Take1 tablet (200 mg total) daily. 11/17/23   Barrett, Shona MATSU, PA-C  apixaban  (ELIQUIS ) 5 MG TABS tablet Take 1 tablet (5 mg total) by mouth 2 (two) times daily. 10/25/23 11/24/23  Arlice Reichert, MD  atorvastatin  (LIPITOR) 40 MG tablet Take 1 tablet (40 mg total) by mouth daily. 10/26/23   Arlice Reichert, MD  Cholecalciferol  (VITAMIN D3) 50 MCG (2000 UT) TABS Take 2,000  Units by mouth daily after breakfast.    [provider]  isosorbide -hydrALAZINE  (BIDIL ) 20-37.5 MG tablet Take 1 tablet by mouth 3 (three) times daily. 11/17/23   Barrett, Shona MATSU, PA-C  nitroGLYCERIN  (NITROSTAT ) 0.4 MG SL tablet Place 1 tablet (0.4 mg total) under the tongue every 5 (five) minutes x 3 doses as needed for chest pain. 10/25/23   Arlice Reichert, MD  predniSONE (DELTASONE) 10 MG tablet Take 10 mg by mouth daily as needed (Gout flares).    [provider]  UNKNOWN TO PATIENT Place 1 drop into both eyes See admin instructions. Unnamed OTC eye drops (name possibly starts with Med) for redness/dryness/itching : Instill 1 drop into both eyes up to three times a day as needed for dryness or irritation    [provider]    Family History    Family History  Problem Relation Age of Onset   Heart disease Father        deceased   Stroke Father    Hypertension Mother        deceased   Stroke Mother    Heart disease Brother    Heart disease Sister        x 2 sister   Heart attack Brother    Stroke Brother    He indicated that his mother is deceased. He indicated that his father is deceased. He indicated that both of his sisters are deceased. He indicated that two of his three brothers are deceased.  Social History    Social History   Socioeconomic History   Marital status: Divorced    Spouse name: Not on file   Number of children: 2   Years of education: Not on file   Highest education level: Not on file  Occupational History   Occupation: filter specialist  Tobacco Use   Smoking status: Never   Smokeless tobacco: Never  Vaping Use   Vaping status: Never Used  Substance and Sexual Activity   Alcohol use: Yes    Comment: occ wine   Drug use: No   Sexual activity: Not on file  Other Topics Concern   Not on file  Social History Narrative   Not on file   Social Drivers of Health   Financial Resource Strain: Not on file  Food Insecurity:  No Food Insecurity (11/15/2023)   Hunger Vital Sign    Worried  About Running Out of Food in the Last Year: Never true    Ran Out of Food in the Last Year: Never true  Transportation Needs: No Transportation Needs (11/15/2023)   PRAPARE - Administrator, Civil Service (Medical): No    Lack of Transportation (Non-Medical): No  Physical Activity: Not on file  Stress: Not on file  Social Connections: Moderately Integrated (11/15/2023)   Social Connection and Isolation Panel    Frequency of Communication with Friends and Family: More than three times a week    Frequency of Social Gatherings with Friends and Family: Twice a week    Attends Religious Services: More than 4 times per year    Active Member of [employer] or Organizations: Yes    Attends Banker Meetings: 1 to 4 times per year    Marital Status: Divorced  Catering manager Violence: Not At Risk (11/15/2023)   Humiliation, Afraid, Rape, and Kick questionnaire    Fear of Current or Ex-Partner: No    Emotionally Abused: No    Physically Abused: No    Sexually Abused: No     Review of Systems    General:  No chills, fever, night sweats or weight changes.  Cardiovascular:  No chest pain, dyspnea on exertion, edema, orthopnea, palpitations, paroxysmal nocturnal dyspnea. Dermatological: No rash, lesions/masses Respiratory: No cough, dyspnea Urologic: No hematuria, dysuria Abdominal:   No nausea, vomiting, diarrhea, bright red blood per rectum, melena, or hematemesis Neurologic:  No visual changes, wkns, changes in mental status. All other systems reviewed and are otherwise negative except as noted above.  Physical Exam    VS:  BP (!) 140/60   Pulse 80   Wt 179 lb (81.2 kg)   SpO2 96%   BMI 24.28 kg/m  , BMI Body mass index is 24.28 kg/m. GEN: Well nourished, well developed, in no acute distress. HEENT: normal. Neck: Supple, no JVD, carotid bruits, or masses. Cardiac: RRR, no murmurs, rubs, or gallops. No  clubbing, cyanosis, generalized bilateral lower extremity nonpitting edema.  Radials/DP/PT 2+ and equal bilaterally.  Respiratory:  Respirations regular and unlabored, clear to auscultation bilaterally. GI: Soft, nontender, nondistended, BS + x 4. MS: no deformity or atrophy. Skin: warm and dry, no rash. Neuro:  Strength and sensation are intact. Psych: Normal affect.  Accessory Clinical Findings    Recent Labs: 10/21/2023: Magnesium 2.4 10/24/2023: TSH 2.306 11/17/2023: BUN 22; Creatinine, Ser 2.71; Hemoglobin 9.9; Platelets 197; Potassium 4.9; Sodium 136   Recent Lipid Panel    Component Value Date/Time   CHOL 176 04/01/2017 1549   TRIG 94 04/01/2017 1549   HDL 61 04/01/2017 1549   CHOLHDL 2.9 04/01/2017 1549   LDLCALC 96 04/01/2017 1549         ECG personally reviewed by me today- none today    Echocardiogram 10/22/2023  IMPRESSIONS     1. Left ventricular ejection fraction, by estimation, is 30 to 35%. The  left ventricle has moderately decreased function. The left ventricle  demonstrates regional wall motion abnormalities (see scoring  diagram/findings for description). Left ventricular   diastolic parameters are consistent with Grade II diastolic dysfunction  (pseudonormalization). The inferior septum, entire inferior wall, apical  anterior segment, and apex are hypokinetic.   2. Right ventricular systolic function is mildly reduced. The right  ventricular size is mildly enlarged. Tricuspid regurgitation signal is  inadequate for assessing PA pressure.   3. The mitral valve is normal in structure. Trivial mitral valve  regurgitation.  No evidence of mitral stenosis.   4. The aortic valve is tricuspid. Aortic valve regurgitation is not  visualized. No aortic stenosis is present.   5. The inferior vena cava is normal in size with greater than 50%  respiratory variability, suggesting right atrial pressure of 3 mmHg.   Conclusion(s)/Recommendation(s): No left  ventricular mural or apical  thrombus/thrombi.   FINDINGS   Left Ventricle: Left ventricular ejection fraction, by estimation, is 30  to 35%. The left ventricle has moderately decreased function. The left  ventricle demonstrates regional wall motion abnormalities. Definity   contrast agent was given IV to delineate  the left ventricular endocardial borders. The left ventricular internal  cavity size was normal in size. There is no left ventricular hypertrophy.  Left ventricular diastolic parameters are consistent with Grade II  diastolic dysfunction  (pseudonormalization).     LV Wall Scoring:  The inferior septum, entire inferior wall, apical anterior segment, and  apex  are hypokinetic. The inferior septum, entire inferior wall, apical  anterior  segment, and apex are hypokinetic.   Right Ventricle: The right ventricular size is mildly enlarged. Right  vetricular wall thickness was not well visualized. Right ventricular  systolic function is mildly reduced. Tricuspid regurgitation signal is  inadequate for assessing PA pressure.   Left Atrium: Left atrial size was normal in size.   Right Atrium: Right atrial size was normal in size.   Pericardium: Trivial pericardial effusion is present.   Mitral Valve: The mitral valve is normal in structure. Trivial mitral  valve regurgitation. No evidence of mitral valve stenosis.   Tricuspid Valve: The tricuspid valve is normal in structure. Tricuspid  valve regurgitation is trivial. No evidence of tricuspid stenosis.   Aortic Valve: The aortic valve is tricuspid. Aortic valve regurgitation is  not visualized. No aortic stenosis is present. Aortic valve mean gradient  measures 7.0 mmHg. Aortic valve peak gradient measures 12.4 mmHg. Aortic  valve area, by VTI measures 2.76   cm.   Pulmonic Valve: The pulmonic valve was not well visualized. Pulmonic valve  regurgitation is not visualized. No evidence of pulmonic stenosis.   Aorta:  The aortic root and ascending aorta are structurally normal, with  no evidence of dilitation.   Venous: The inferior vena cava is normal in size with greater than 50%  respiratory variability, suggesting right atrial pressure of 3 mmHg.   IAS/Shunts: The interatrial septum was not well visualized.   LHC 10/24/2023    Prox RCA lesion is 30% stenosed.   2nd Mrg lesion is 40% stenosed.   Prox LAD to Mid LAD lesion is 30% stenosed.   Prox Cx to Dist Cx lesion is 20% stenosed.   1.  Mild nonobstructive coronary artery disease. 2.  Left ventricular angiography was not performed due to chronic kidney disease.  EF was moderately reduced by echo. 3.  Right heart catheterization showed normal right and left-sided filling pressures, mild pulmonary hypertension and normal cardiac output   RA: 8 mmHg, RV: 39/2 mmHg, PW: 10 mmHg, PA: 39/10 mmHg.  Cardiac output is 5.63 with an index of 2.82.   Recommendations: No clear culprit is identified for elevated troponin which is likely due to supply demand ischemia.  Possible underlying arrhythmia. Heparin  drip can be resumed 2 hours after TR band removal and a DOAC can be started tomorrow if needed. Only 15 mL of contrast was used for the procedure.   Cardiac MRI 12/13/2023   INDINGS: Left ventricle:   -Mild dilatation   -  Mild hypertrophy   -Normal systolic function   -Mild ECV elevation (29%)   -No LGE   LV EF: 60% (Normal 49-79%)   Absolute volumes:   LV EDV: (Normal 95-215 mL)   LV ESV: 89mL (Normal 25-85 mL)   LV SV: (Normal 61-145 mL)   CO: 5.6L/min (Normal 3.4-7.8 L/min)   Indexed volumes:   LV EDV: 134mL/sq-m (Normal 50-108 mL/sq-m)   LV ESV: 60mL/sq-m (Normal 11-47 mL/sq-m)   LV SV: 77mL/sq-m (Normal 33-72 mL/sq-m)   CI: 2.8L/min/sq-m (Normal 1.8-4.2 L/min/sq-m)   Right ventricle: Normal size and systolic function   RV EF:  64% (Normal 51-80%)   Absolute volumes:   RV EDV: (Normal 109-217  mL)   RV ESV: 72mL (Normal 23-91 mL)   RV SV: (Normal 71-141 mL)   CO: 5.4L/min (Normal 2.8-8.8 L/min)   Indexed volumes:   RV EDV: 119mL/sq-m (Normal 58-109 mL/sq-m)   RV ESV: 58mL/sq-m (Normal 12-46 mL/sq-m)   RV SV: 46mL/sq-m (Normal 38-71 mL/sq-m)   CI: 2.7L/min/sq-m (Normal 1.7-4.2 L/min/sq-m)   Left atrium: Mild enlargement   Right atrium: Mild enlargement   Mitral valve: Trivial regurgitation   Aortic valve: Tricuspid.  Trivial regurgitation   Tricuspid valve: Mild regurgitation   Pulmonic valve: No regurgitation   Aorta: Normal proximal ascending aorta   Pericardium: Normal   Extracardiac structures: Right lung nodule not well visualized, recommend CT chest for further evaluation   IMPRESSION: 1. Mild LV dilatation, mild hypertrophy, and normal systolic function (EF 60%)   2.  Normal RV size and systolic function (EF 64%)   3.  No late gadolinium enhancement to suggest myocardial scar   4. Right lung nodule not well visualized, recommend CT chest for further evaluation     Electronically Signed   By: Lonni Nanas M.D.   On: 12/14/2023 15:27   Assessment & Plan   1.  Bradycardia-heart rate today 80 bmp.  He was previously seen and evaluated by EP.  His symptoms felt to be related to blood pressure and not related to heart rate.  Cardiac MRI showed EF of 60% and no LGE.  Not a candidate for beta-blocker therapy Cardiac event monitor in progress Continue to monitor  Essential hypertension-BP today 140/60 Maintain blood pressure log Continue isosorbide , hydralazine ,  doxazosin, hydrochlorothiazide  Start losartan  50 mg daily Heart healthy low-sodium diet Ordered BMP today and in 1 week  Atrial flutter-heart rate today 80 bpm.  Not a candidate for beta-blocker therapy.  Creatinine  2.71 on 11/17/2023.  (Patient is 68, weight greater than 60 kg).  On Eliquis .  On appropriate dose.  He reports compliance with this.  He denies bleeding  issues and recent trauma. Continue amiodarone , Eliquis  Await cardiac event monitor  HFrEF-stable.  Continues with daily exercise routine.  Denies heart failure type symptoms.  Weight today 179.  EF 50-55% 2019.  Echocardiogram 10/22/2023 showed an LVEF of 30-35%, G2 DD, trivial mitral valve regurgitation.  Reduced EF may have been related to tachycardia mediated atrial flutter during 10/21/2023 to 10/25/2023 admission.  Cardiac MRI showed LVEF of 60% and no LGE Continue furosemide , BiDil , HCTZ, Heart healthy low-sodium diet Daily weights  Coronary artery disease-denies exertional chest discomfort.  With cardiac catheterization 10/24/2023 which showed mild nonobstructive CAD Heart high-fiber diet Continue atorvastatin   Hyperlipidemia-LDL 133 on 06/19/2023. Continue atorvastatin  Increase physical activity as tolerated High-fiber diet Repeat fasting lipids and LFTs 12/25    Disposition: Follow-up with Dr. Anner or me in 4-6 months.  Terry Moss. Terry Sturkey NP-C     02/28/2024, 9:49 AM [facility] [address] Office [phone] Fax [phone]    I spent 15 minutes examining this patient, reviewing medications, and using patient centered shared decision making involving their cardiac care.   I spent  20 minutes reviewing past medical history,  medications, and prior cardiac tests.

## 2024-02-27 ENCOUNTER — Ambulatory Visit (HOSPITAL_COMMUNITY)
Admission: RE | Admit: 2024-02-27 | Discharge: 2024-02-27 | Disposition: A | Discharge status: Home / Self Care | Source: Ambulatory Visit | Attending: Nephrology | Admitting: Nephrology

## 2024-02-27 DIAGNOSIS — D631 Anemia in chronic kidney disease: Secondary | ICD-10-CM | POA: Insufficient documentation

## 2024-02-27 DIAGNOSIS — N189 Chronic kidney disease, unspecified: Secondary | ICD-10-CM | POA: Diagnosis present

## 2024-02-27 MED ORDER — IRON SUCROSE 200 MG IVPB - SIMPLE MED
200.0000 mg | Freq: Once | Status: AC
  Administered 2024-02-27: 200 mg via INTRAVENOUS
  Filled 2024-02-27: qty 200

## 2024-02-28 ENCOUNTER — Encounter: Payer: Self-pay | Admitting: General Practice

## 2024-02-28 ENCOUNTER — Ambulatory Visit: Attending: General Practice | Admitting: General Practice

## 2024-02-28 VITALS — BP 140/60 | HR 80 | Wt 179.0 lb

## 2024-02-28 DIAGNOSIS — E782 Mixed hyperlipidemia: Secondary | ICD-10-CM

## 2024-02-28 DIAGNOSIS — R001 Bradycardia, unspecified: Secondary | ICD-10-CM | POA: Diagnosis not present

## 2024-02-28 DIAGNOSIS — I10 Essential (primary) hypertension: Secondary | ICD-10-CM | POA: Diagnosis not present

## 2024-02-28 DIAGNOSIS — I4892 Unspecified atrial flutter: Secondary | ICD-10-CM | POA: Diagnosis not present

## 2024-02-28 DIAGNOSIS — I5020 Unspecified systolic (congestive) heart failure: Secondary | ICD-10-CM

## 2024-02-28 DIAGNOSIS — I2510 Atherosclerotic heart disease of native coronary artery without angina pectoris: Secondary | ICD-10-CM

## 2024-02-28 LAB — BASIC METABOLIC PANEL WITH GFR
BUN/Creatinine Ratio: 11 (ref 10–24)
BUN: 31 mg/dL — ABNORMAL HIGH (ref 8–27)
CO2: 21 mmol/L (ref 20–29)
Calcium: 9.7 mg/dL (ref 8.6–10.2)
Chloride: 105 mmol/L (ref 96–106)
Creatinine, Ser: 2.83 mg/dL — ABNORMAL HIGH (ref 0.76–1.27)
Glucose: 90 mg/dL (ref 70–99)
Potassium: 5.2 mmol/L (ref 3.5–5.2)
Sodium: 142 mmol/L (ref 134–144)
eGFR: 23 mL/min/1.73 — ABNORMAL LOW (ref >59)

## 2024-02-28 MED ORDER — LOSARTAN POTASSIUM 50 MG PO TABS: 50.0000 mg | ORAL_TABLET | Freq: Every day | ORAL | 3 refills | Status: DC

## 2024-02-28 NOTE — Patient Instructions (Signed)
 Medication Instructions:  Your physician has recommended you make the following change in your medication:  START LOSARTAN  50 MG DAILY.   *If you need a refill on your cardiac medications before your next appointment, please call your pharmacy*  Lab Work: TODAY: BMET  TO BE DONE IN 1 WEEK: BMET  If you have labs (blood work) drawn today and your tests are completely normal, you will receive your results only by: MyChart Message (if you have MyChart) OR A paper copy in the mail If you have any lab test that is abnormal or we need to change your treatment, we will call you to review the results.  Testing/Procedures: NONE  Follow-Up: At [facility], you and your health needs are our priority.  As part of our continuing mission to provide you with exceptional heart care, our providers are all part of one team.  This team includes your primary Cardiologist (physician) and Advanced Practice Providers or APPs (Physician Assistants and Nurse Practitioners) who all work together to provide you with the care you need, when you need it.  Your next appointment:   4-6 month(s)  Provider:   DR. HARDING or Josefa Beauvais, NP        We recommend signing up for the patient portal called MyChart.  Sign up information is provided on this After Visit Summary.  MyChart is used to connect with patients for Virtual Visits (Telemedicine).  Patients are able to view lab/test results, encounter notes, upcoming appointments, etc.  Non-urgent messages can be sent to your provider as well.   To learn more about what you can do with MyChart, go to [email or web address].   Other Instructions Exercise regularly as told by your doctor. Make sure to weight daily and keep a weight log.   Moderate-intensity exercise is any activity that gets you moving enough to burn at least three times more energy (calories) than if you were sitting. Examples of moderate exercise include: Walking a mile in 15  minutes. Doing light yard work. Biking at an easy pace. Most people should get at least 150 minutes of moderate-intensity exercise a week to maintain their body weight.  Increase your water  intake: Maintain hydration    Iron -Rich Diet  Iron  is a mineral that helps your body produce hemoglobin. Hemoglobin is a protein in red blood cells that carries oxygen  to your body's tissues. Eating too little iron  may cause you to feel weak and tired, and it can increase your risk of infection. Iron  is naturally found in many foods, and many foods have iron  added to them (are iron -fortified). You may need to follow an iron -rich diet if you do not have enough iron  in your body due to certain medical conditions. The amount of iron  that you need each day depends on your age, your sex, and any medical conditions you have. Follow instructions from your health care provider or a dietitian about how much iron  you should eat each day. What are tips for following this plan? Reading food labels Check food labels to see how many milligrams (mg) of iron  are in each serving. Cooking Cook foods in pots and pans that are made from iron . Take these steps to make it easier for your body to absorb iron  from certain foods: Soak beans overnight before cooking. Soak whole grains overnight and drain them before using. Ferment flours before baking, such as by using yeast in bread dough. Meal planning When you eat foods that contain iron , you should eat them with  foods that are high in vitamin C. These include oranges, peppers, tomatoes, potatoes, and mangoes. Vitamin C helps your body absorb iron . Certain foods and drinks prevent your body from absorbing iron  properly. Avoid eating these foods in the same meal as iron -rich foods or with iron  supplements. These foods include: Coffee, black tea, and red wine. Milk, dairy products, and foods that are high in calcium . Beans and soybeans. Whole grains. General information Take  iron  supplements only as told by your health care provider. An overdose of iron  can be life-threatening. If you were prescribed iron  supplements, take them with orange juice or a vitamin C supplement. When you eat iron -fortified foods or take an iron  supplement, you should also eat foods that naturally contain iron , such as meat, poultry, and fish. Eating naturally iron -rich foods helps your body absorb the iron  that is added to other foods or contained in a supplement. Iron  from animal sources is better absorbed than iron  from plant sources. What foods should I eat? Vegetables Spinach (cooked). Green peas. Broccoli. Fermented vegetables. Eat vegetables high in vitamin C, such as leafy greens, potatoes, bell peppers, and tomatoes, with iron -rich foods. Grains Iron -fortified breakfast cereal. Iron -fortified whole-wheat bread. Enriched rice. Sprouted grains. Meats and other proteins Beef liver. Beef. [place]. Chicken. Oysters. Shrimp. Tuna. Sardines. Chickpeas. Nuts. Tofu. Pumpkin seeds. Beverages Tomato juice. Fresh orange juice. Prune juice. Hibiscus tea. Iron -fortified instant breakfast shakes. Sweets and desserts Blackstrap molasses. Seasonings and condiments Tahini. Fermented soy sauce. Other foods Wheat germ. The items listed above may not be a complete list of recommended foods and beverages. Contact a dietitian for more information. What foods should I limit? These are foods that should be limited while eating iron -rich foods as they can reduce the absorption of iron  in your body. Grains Whole grains. Bran cereal. Bran flour. Meats and other proteins Soybeans. Products made from soy protein. Black beans. Lentils. Mung beans. Split peas. Dairy Milk. Cream. Cheese. Yogurt. Cottage cheese. Beverages Coffee. Black tea. Red wine. Sweets and desserts Cocoa. Chocolate. Ice cream. Seasonings and condiments Basil. Oregano. Large amounts of parsley. The items listed above may not be a  complete list of foods and beverages you should limit. Contact a dietitian for more information. Summary Iron  is a mineral that helps your body produce hemoglobin. Hemoglobin is a protein in red blood cells that carries oxygen  to your body's tissues. Iron  is naturally found in many foods, and many foods have iron  added to them (are iron -fortified). When you eat foods that contain iron , you should eat them with foods that are high in vitamin C. Vitamin C helps your body absorb iron . Certain foods and drinks prevent your body from absorbing iron  properly, such as whole grains and dairy products. You should avoid eating these foods in the same meal as iron -rich foods or with iron  supplements. This information is not intended to replace advice given to you by your health care provider. Make sure you discuss any questions you have with your health care provider. Document Revised: 06/02/2023 Document Reviewed: 06/02/2023 Elsevier Patient Education  2025 [employer].

## 2024-03-03 ENCOUNTER — Ambulatory Visit: Payer: Self-pay | Admitting: General Practice

## 2024-03-05 ENCOUNTER — Telehealth: Payer: Self-pay | Admitting: General Practice

## 2024-03-05 NOTE — Telephone Encounter (Signed)
 Had an infusion in [place] Thursday for iron  level, slightly anemic  Thought would help with lightheadedness, hasn't  STAT if patient feels like he/she is going to faint   Are you dizzy, lightheaded, or faint now?  No, only occurs with exertion.  Have you passed out?  No   Do you have any other symptoms?  Feels tired with minimal exertion   Have you checked your HR and BP (record if available)?  HR is usually low, but it's been extra low recently, in the 40's  129/59 45 this morning  40/71 45 132/64 48 currently  STAT if HR is under 50 or over 120  (normal HR is 60-100 beats per minute)  What is your heart rate?  48 CURRENTLY  Do you have a log of your heart rate readings (document readings)?  45, 45, 48  Do you have any other symptoms?

## 2024-03-05 NOTE — Telephone Encounter (Signed)
 Received incoming STAT call from pt. His HR has been around 45 for the past 2-3 days. He also has felt dizzy/lightheaded. Denies CP, SOB/DOE, palpitations. He has had this problem for the last 2-3 weeks at least. Had an Iron  Infusion last week and initially felt better, but now is feeling worse.   BP's: Today 125/58, HR=48  On 03/04/24 132/64, and 140/71  He is on all medications, as prescribed but notified him to d/c Losartan , per Cleaver, NP's result note from 03/03/24. He also states he has never needed to check his CBG and he has no way to monitor blood sugars. He takes Lasix  40 mg three times weekly (Mon/Wed/Fri). He has his heart monitor and plans to apply it tonight.   He will see PCP on 03/06/2024 in am. Also plans on repeating labs on 9/5/22025.

## 2024-03-05 NOTE — Telephone Encounter (Signed)
 Called pt and gave him Cleaver, NP's rec's. Pt verbalized understanding. ER precautions reviewed and f/u appt made for 03/11/24 with Duke, PA.

## 2024-03-06 NOTE — Progress Notes (Deleted)
 Cardiology Office Note:    Date:  03/06/2024   ID:  Terry Moss Munroe Sr., DOB [birth year], MRN [id number]  PCP:  Terry Elsie JONELLE, MD   [facility] Providers Cardiologist:  [name], MD Electrophysiologist:  [name], MD { Click to update primary MD,subspecialty MD or APP then REFRESH:1}    Referring MD: Terry Elsie JONELLE, MD   No chief complaint on file. ***  History of Present Illness:    Terry R Rilan Eiland. is a 68 y.o. 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  Echo in April 2025 EF 30 to 35%.  Currently cardiac MRI shows EF 60% (June 2025)   NSVT (nonsustained ventricular tachycardia) (HCC)    History of.   Paroxysmal atrial flutter (HCC) 10/2023   Now on amiodarone  and Eliquis    Pneumonia 1993   Sinus complaint     Past Surgical History:  Procedure Laterality Date   Cardiac MRI  12/2023   LVEF ~60%, mildly dilated LV, mild LVH.  Normal RV.  RVEF 64%.  No evidence of scar.  Right lung nodule noted.   ELBOW SURGERY Right 05/2018   PELVIC LYMPH NODE DISSECTION Bilateral 11/14/2018   Procedure: PELVIC LYMPH NODE DISSECTION;  Surgeon: Terry Morene ORN, MD;  Location: [facility];  Service: Urology;  Laterality: Bilateral;   pleurisy  1993   Right lung surgery    PROSTATE BIOPSY     RIGHT/LEFT HEART CATH AND CORONARY ANGIOGRAPHY N/A 10/24/2023   Procedure: RIGHT/LEFT HEART CATH AND CORONARY ANGIOGRAPHY;  Surgeon: Darron Deatrice LABOR, MD;  Location: [facility]AS[facility];  Mild, nonobstructive CAD (prox RCA 30%, prox-mid LAD 30%, prox-dist LCx 20%, OM2 40%). RHC: PAP 39/10; PCWP 10.  CO 5.6, CI 2.82 - Normal   ROBOT ASSISTED  LAPAROSCOPIC RADICAL PROSTATECTOMY N/A 11/14/2018   Procedure: XI ROBOTIC ASSISTED LAPAROSCOPIC RADICAL PROSTATECTOMY;  Surgeon: Terry Morene ORN, MD;  Location: [facility];  Service: Urology;  Laterality: N/A;   TRANSTHORACIC ECHOCARDIOGRAM  10/2023   Echo 01/13/2018: EF 50 and 55%.  Moderate LVH.  Moderate LA dilation.  Mild reduction.;; *10/2023: LVEF 30-35%, RWMA: Inferior septum, entire inferior wall, anterior apical and apex hypokinesis. GR 2 DD.  Mild enlarged RV unable to measure PAP/RVSP.  Normal AoV and MV.  Normal RAP.  No LV mural or apical thrombus.    Current Medications: No outpatient medications have been marked as taking for the 03/11/24 encounter (Appointment) with Terry Jon Garre, PA.     Allergies:   Cefaclor and Other   Social History   Socioeconomic History   Marital status: Divorced    Spouse name: Not on file   Number of children: 2   Years of education: Not on file   Highest education level: Not on file  Occupational History   Occupation: filter specialist  Tobacco Use   Smoking status: Never   Smokeless tobacco: Never  Vaping Use   Vaping status: Never Used  Substance and Sexual Activity   Alcohol use: Yes    Comment: occ wine   Drug use: No   Sexual activity: Not on  file  Other Topics Concern   Not on file  Social History Narrative   Not on file   Social Drivers of Health   Financial Resource Strain: Not on file  Food Insecurity: No Food Insecurity (11/15/2023)   Hunger Vital Sign    Worried About Running Out of Food in the Last Year: Never true    Ran Out of Food in the Last Year: Never true  Transportation Needs: No Transportation Needs (11/15/2023)   PRAPARE - Administrator, Civil Service (Medical): No    Lack of Transportation (Non-Medical): No  Physical Activity: Not on file  Stress: Not on file  Social Connections: Moderately Integrated (11/15/2023)   Social Connection and Isolation Panel    Frequency of Communication with  Friends and Family: More than three times a week    Frequency of Social Gatherings with Friends and Family: Twice a week    Attends Religious Services: More than 4 times per year    Active Member of [employer] or Organizations: Yes    Attends Banker Meetings: 1 to 4 times per year    Marital Status: Divorced     Family History: The patient's ***family history includes Heart attack in his brother; Heart disease in his brother, father, and sister; Hypertension in his mother; Stroke in his brother, father, and mother.  ROS:   Please see the history of present illness.    *** All other systems reviewed and are negative.  EKGs/Labs/Other Studies Reviewed:    The following studies were reviewed today: ***      Recent Labs: 10/21/2023: Magnesium 2.4 10/24/2023: TSH 2.306 11/17/2023: Hemoglobin 9.9; Platelets 197 02/28/2024: BUN 31; Creatinine, Ser 2.83; Potassium 5.2; Sodium 142  Recent Lipid Panel    Component Value Date/Time   CHOL 176 04/01/2017 1549   TRIG 94 04/01/2017 1549   HDL 61 04/01/2017 1549   CHOLHDL 2.9 04/01/2017 1549   LDLCALC 96 04/01/2017 1549     Risk Assessment/Calculations:   {Does this patient have ATRIAL FIBRILLATION?:[id number]}  No BP recorded.  {Refresh Note OR Click here to enter BP  :1}***         Physical Exam:    VS:  There were no vitals taken for this visit.    Wt Readings from Last 3 Encounters:  02/28/24 179 lb (81.2 kg)  02/27/24 176 lb (79.8 kg)  02/04/24 179 lb (81.2 kg)     GEN: *** Well nourished, well developed in no acute distress HEENT: Normal NECK: No JVD; No carotid bruits LYMPHATICS: No lymphadenopathy CARDIAC: ***RRR, no murmurs, rubs, gallops RESPIRATORY:  Clear to auscultation without rales, wheezing or rhonchi  ABDOMEN: Soft, non-tender, non-distended MUSCULOSKELETAL:  No edema; No deformity  SKIN: Warm and dry NEUROLOGIC:  Alert and oriented x 3 PSYCHIATRIC:  Normal affect   ASSESSMENT:    No diagnosis  found. PLAN:    In order of problems listed above:  ***      {Are you ordering a CV Procedure (e.g. stress test, cath, DCCV, TEE, etc)?   Press F2        :[id number]}    Medication Adjustments/Labs and Tests Ordered: Current medicines are reviewed at length with the patient today.  Concerns regarding medicines are outlined above.  No orders of the defined types were placed in this encounter.  No orders of the defined types were placed in this encounter.   There are no Patient Instructions on file for this visit.   Signed,  Jon Nat Hails, [place]  03/06/2024 9:13 AM    [facility]

## 2024-03-07 LAB — BASIC METABOLIC PANEL WITH GFR
BUN/Creatinine Ratio: 12 (ref 10–24)
BUN: 36 mg/dL — ABNORMAL HIGH (ref 8–27)
CO2: 21 mmol/L (ref 20–29)
Calcium: 9.2 mg/dL (ref 8.6–10.2)
Chloride: 106 mmol/L (ref 96–106)
Creatinine, Ser: 3.05 mg/dL — ABNORMAL HIGH (ref 0.76–1.27)
Glucose: 91 mg/dL (ref 70–99)
Potassium: 4.3 mmol/L (ref 3.5–5.2)
Sodium: 143 mmol/L (ref 134–144)
eGFR: 21 mL/min/1.73 — ABNORMAL LOW (ref >59)

## 2024-03-11 ENCOUNTER — Ambulatory Visit: Admitting: Physician Assistant

## 2024-03-16 NOTE — Progress Notes (Unsigned)
 Cardiology Clinic Note   Patient Name: Terry DOYON Sr. Date of Encounter: 03/17/2024  Primary Care Provider:  Arloa Elsie JONELLE, MD Primary Cardiologist:  [name], MD  Patient Profile    Terry JONELLE Munroe Sr. 68 year old male presents to the clinic today for follow-up evaluation of his essential hypertension, chronic systolic CHF and near syncope.  Past Medical History    Past Medical History:  Diagnosis Date   Anemia    Cancer (HCC) dx dec 2019   prostate   Chronic kidney disease (CKD), active medical management without dialysis, stage 4 (severe) (HCC)    Creatinine range 2.7-0.8   Hypertensive heart disease with CHF (congestive heart failure) (HCC)    Alternating episodes of cardiomyopathy with a EF in the 30% range.  Echo in April 2025 EF 30 to 35%.  Currently cardiac MRI shows EF 60% (June 2025)   NSVT (nonsustained ventricular tachycardia) (HCC)    History of.   Paroxysmal atrial flutter (HCC) 10/2023   Now on amiodarone  and Eliquis    Pneumonia 1993   Sinus complaint    Past Surgical History:  Procedure Laterality Date   Cardiac MRI  12/2023   LVEF ~60%, mildly dilated LV, mild LVH.  Normal RV.  RVEF 64%.  No evidence of scar.  Right lung nodule noted.   ELBOW SURGERY Right 05/2018   PELVIC LYMPH NODE DISSECTION Bilateral 11/14/2018   Procedure: PELVIC LYMPH NODE DISSECTION;  Surgeon: Cam Morene ORN, MD;  Location: [facility];  Service: Urology;  Laterality: Bilateral;   pleurisy  1993   Right lung surgery    PROSTATE BIOPSY     RIGHT/LEFT HEART CATH AND CORONARY ANGIOGRAPHY N/A 10/24/2023   Procedure: RIGHT/LEFT HEART CATH AND CORONARY ANGIOGRAPHY;  Surgeon: Darron Deatrice LABOR, MD;  Location: [facility];  Mild, nonobstructive CAD (prox RCA 30%, prox-mid LAD 30%, prox-dist LCx 20%, OM2 40%). RHC: PAP 39/10; PCWP 10.  CO 5.6, CI 2.82 - Normal   ROBOT ASSISTED LAPAROSCOPIC RADICAL PROSTATECTOMY N/A 11/14/2018   Procedure: XI ROBOTIC ASSISTED  LAPAROSCOPIC RADICAL PROSTATECTOMY;  Surgeon: Cam Morene ORN, MD;  Location: [facility];  Service: Urology;  Laterality: N/A;   TRANSTHORACIC ECHOCARDIOGRAM  10/2023   Echo 01/13/2018: EF 50 and 55%.  Moderate LVH.  Moderate LA dilation.  Mild reduction.;; *10/2023: LVEF 30-35%, RWMA: Inferior septum, entire inferior wall, anterior apical and apex hypokinesis. GR 2 DD.  Mild enlarged RV unable to measure PAP/RVSP.  Normal AoV and MV.  Normal RAP.  No LV mural or apical thrombus.    Allergies  Allergies  Allergen Reactions   Cefaclor Other (See Comments)    Hallucinations   Other Other (See Comments)    Deconamine was a brand name medication that contains two different drugs, an antihistamine (chlorpheniramine) and a decongestant (pseudoephedrine) = Hallucinations    History of Present Illness    Terry R Konkle Sr. has a PMH of bradycardia, chronic systolic CHF, HTN, NICM, stage III CKD, prostate cancer status post prostatectomy, and NSVT.  EF 2009 showed an EF of 40% via stress test, echocardiogram 2016 showed LVEF of 25-30% (unable to get cardiac catheterization due to CKD), his EF improved to 50-55% in 2019.  He was seen in follow-up by Dr. Alveta 3/25.  During that time he continued to be very active.  He was having some knee pain.  He complained of gout symptoms.  He noted that he is 1 of 12 children in his family.  His older sister is the only person who does not have a pacemaker.  He was admitted on 10/21/2023 and discharged on 10/25/2023 with NSTEMI.  His cardiac troponins peaked at 252.  He underwent cardiac catheterization which showed no obstructive coronary disease.  His EF was noted to be 35% with G2 DD on echocardiogram.  He had an episode of atrial flutter with RVR and was started on amiodarone  and apixaban .  Due to his chronic bradycardia he was not placed on beta-blocker therapy.  He was transferred from [facility] 11/16/2023 for feeling lightheaded,  diaphoretic and he was noted to have significantly elevated blood pressure in the 216/97 range.  At [facility] he was noted to have a heart rate of 30s-40s.  His amiodarone  was held and he was transferred to [facility] for consideration of CRT-D.  EP consult was placed.  At [facility] his symptoms resolved.  He was monitored on telemetry.  He was seen by Dr. Inocencio 11/17/2023.  It was felt that he was minimally symptomatic with his bradycardia.  It was felt that his dizziness may have been related to blood pressure changes.  His blood pressure medications (Imdur  and hydralazine  had been increased for further blood pressure control.  He was continued on amiodarone  and Eliquis .  Due to his renal function his losartan  and HCTZ were placed on hold.  He was discharged and instructed to follow-up with cardiology as an outpatient.  Cardiac MRI scheduled for 12/13/2023.  He presented to the clinic 11/28/23 for follow-up evaluation and stated he had started to exercise again.  He did jump rope, Pullups, and used his dumbbells today.  He worked out for about 20 minutes.  He felt fine with this.  We reviewed his recent emergency department visit and recommendations for cardiac MRI.  He expressed understanding.  Initially his blood pressure was 160/64 and on recheck was 152/70.  I encouraged him to continue to eat a heart healthy low-sodium diet, prescribed amlodipine  2.5 mg daily and planned follow-up after cardiac MRI.  He was seen in follow-up by Dr. Anner 02/04/2024.  During that time he he had been referred back by [facility] physicians.  HCTZ had been added to his medication regimen then stopped.  He had been started on doxazosin 1 mg daily.  His doxazosin was increased to 2 mg daily on 01/31/2024.  He was taking amlodipine  10 mg daily, BiDil  20-37.53 times daily as well.  Dr. Anner started HCTZ 12.5 mg daily.  He was also continued on furosemide  40 mg daily.  His cardiac MRI showed no evidence of infarction.   It was felt that he had transient myocarditis and tachycardia mediated atrial fibrillation/flutter leading to demand ischemia.  It was felt that managing his blood pressure and avoiding elevated heart rate would be an appropriate course of management/treatment.  He was given a cardiac event monitor.  Cardiac event monitor has not yet resulted.  He presented to the clinic 02/28/24 for follow-up evaluation and stated he had been staying physically active.  He continued to do his morning calisthenics and was looking forward to getting back to playing basketball and jogging.  We reviewed his cardiac MRI.  He reported that when he was in his 30s he had a significant case of pneumonia that required a 7-hour surgery.  He was monitored every 6 to 12 months postoperatively.  He believed the lung nodule was related to that.  He planned follow-up with his PCP.  His blood  pressure was initially 151/70 on recheck was 140/60.  He reported he had an iron  infusion the prior day and was feeling much better.  I  restarted his losartan  50 mg daily, ordered bmet and planned bmet in 1 week.   Follow-up in 4 to 6 months was planned.  His BMP showed increased BUN and creatinine.  I discontinued his losartan  and decreased his furosemide  to 20 mg Monday Wednesday Friday.  He reported continued dizziness.  He presents to the clinic today for follow-up evaluation and states he had continued to take his furosemide  40 mg every other day.  We reviewed his recent lab work.  He notes that his dizziness has almost resolved.  He had started taking allergy medication as directed from his PCP.  His blood pressure today is slightly elevated at 144/62.  On review his home blood pressures have been in the 120-130 systolic.  He does have some slight bilateral lower extremity swelling.  He continues to be physically active doing yard work and outside activities.  I will have him continue to maintain weight log, monitor blood pressure, eat low-sodium  diet, maintain his physical activity, await cardiac monitor, and have instructed him to reduce his furosemide  to 20 mg Monday Wednesday Friday.  We will plan follow-up in 4 to 6 months.  I will also repeat his BMP at in 1-2 weeks.  Today he denies chest pain, shortness of breath, fatigue, palpitations, melena, hematuria, hemoptysis, diaphoresis, weakness, presyncope, syncope, orthopnea, and PND.    Home Medications    Prior to Admission medications   Medication Sig Start Date End Date Taking? Authorizing Provider  amiodarone  (PACERONE ) 200 MG tablet Take1 tablet (200 mg total) daily. 11/17/23   Barrett, Shona MATSU, PA-C  apixaban  (ELIQUIS ) 5 MG TABS tablet Take 1 tablet (5 mg total) by mouth 2 (two) times daily. 10/25/23 11/24/23  Arlice Reichert, MD  atorvastatin  (LIPITOR) 40 MG tablet Take 1 tablet (40 mg total) by mouth daily. 10/26/23   Arlice Reichert, MD  Cholecalciferol  (VITAMIN D3) 50 MCG (2000 UT) TABS Take 2,000 Units by mouth daily after breakfast.    [provider]  isosorbide -hydrALAZINE  (BIDIL ) 20-37.5 MG tablet Take 1 tablet by mouth 3 (three) times daily. 11/17/23   Barrett, Shona MATSU, PA-C  nitroGLYCERIN  (NITROSTAT ) 0.4 MG SL tablet Place 1 tablet (0.4 mg total) under the tongue every 5 (five) minutes x 3 doses as needed for chest pain. 10/25/23   Arlice Reichert, MD  predniSONE (DELTASONE) 10 MG tablet Take 10 mg by mouth daily as needed (Gout flares).    [provider]  UNKNOWN TO PATIENT Place 1 drop into both eyes See admin instructions. Unnamed OTC eye drops (name possibly starts with Med) for redness/dryness/itching : Instill 1 drop into both eyes up to three times a day as needed for dryness or irritation    [provider]    Family History    Family History  Problem Relation Age of Onset   Heart disease Father        deceased   Stroke Father    Hypertension Mother        deceased   Stroke Mother    Heart disease Brother    Heart disease Sister         x 2 sister   Heart attack Brother    Stroke Brother    He indicated that his mother is deceased. He indicated that his father is deceased. He indicated that both of  his sisters are deceased. He indicated that two of his three brothers are deceased.  Social History    Social History   Socioeconomic History   Marital status: Divorced    Spouse name: Not on file   Number of children: 2   Years of education: Not on file   Highest education level: Not on file  Occupational History   Occupation: filter specialist  Tobacco Use   Smoking status: Never   Smokeless tobacco: Never  Vaping Use   Vaping status: Never Used  Substance and Sexual Activity   Alcohol use: Yes    Comment: occ wine   Drug use: No   Sexual activity: Not on file  Other Topics Concern   Not on file  Social History Narrative   Not on file   Social Drivers of Health   Financial Resource Strain: Not on file  Food Insecurity: No Food Insecurity (11/15/2023)   Hunger Vital Sign    Worried About Running Out of Food in the Last Year: Never true    Ran Out of Food in the Last Year: Never true  Transportation Needs: No Transportation Needs (11/15/2023)   PRAPARE - Administrator, Civil Service (Medical): No    Lack of Transportation (Non-Medical): No  Physical Activity: Not on file  Stress: Not on file  Social Connections: Moderately Integrated (11/15/2023)   Social Connection and Isolation Panel    Frequency of Communication with Friends and Family: More than three times a week    Frequency of Social Gatherings with Friends and Family: Twice a week    Attends Religious Services: More than 4 times per year    Active Member of [employer] or Organizations: Yes    Attends Banker Meetings: 1 to 4 times per year    Marital Status: Divorced  Catering manager Violence: Not At Risk (11/15/2023)   Humiliation, Afraid, Rape, and Kick questionnaire    Fear of Current or Ex-Partner: No     Emotionally Abused: No    Physically Abused: No    Sexually Abused: No     Review of Systems    General:  No chills, fever, night sweats or weight changes.  Cardiovascular:  No chest pain, dyspnea on exertion, edema, orthopnea, palpitations, paroxysmal nocturnal dyspnea. Dermatological: No rash, lesions/masses Respiratory: No cough, dyspnea Urologic: No hematuria, dysuria Abdominal:   No nausea, vomiting, diarrhea, bright red blood per rectum, melena, or hematemesis Neurologic:  No visual changes, wkns, changes in mental status. All other systems reviewed and are otherwise negative except as noted above.  Physical Exam    VS:  BP (!) 144/62   Pulse (!) 45   Ht 6' (1.829 m)   Wt 182 lb 6.4 oz (82.7 kg)   SpO2 98%   BMI 24.74 kg/m  , BMI Body mass index is 24.74 kg/m. GEN: Well nourished, well developed, in no acute distress. HEENT: normal. Neck: Supple, no JVD, carotid bruits, or masses. Cardiac: RRR, no murmurs, rubs, or gallops. No clubbing, cyanosis, generalized bilateral lower extremity nonpitting edema.  Radials/DP/PT 2+ and equal bilaterally.  Respiratory:  Respirations regular and unlabored, clear to auscultation bilaterally. GI: Soft, nontender, nondistended, BS + x 4. MS: no deformity or atrophy. Skin: warm and dry, no rash. Neuro:  Strength and sensation are intact. Psych: Normal affect.  Accessory Clinical Findings    Recent Labs: 10/21/2023: Magnesium 2.4 10/24/2023: TSH 2.306 11/17/2023: Hemoglobin 9.9; Platelets 197 03/06/2024: BUN 36; Creatinine, Ser 3.05; Potassium  4.3; Sodium 143   Recent Lipid Panel    Component Value Date/Time   CHOL 176 04/01/2017 1549   TRIG 94 04/01/2017 1549   HDL 61 04/01/2017 1549   CHOLHDL 2.9 04/01/2017 1549   LDLCALC 96 04/01/2017 1549      ECG personally reviewed by me today- none today    Echocardiogram 10/22/2023  IMPRESSIONS     1. Left ventricular ejection fraction, by estimation, is 30 to 35%. The  left  ventricle has moderately decreased function. The left ventricle  demonstrates regional wall motion abnormalities (see scoring  diagram/findings for description). Left ventricular   diastolic parameters are consistent with Grade II diastolic dysfunction  (pseudonormalization). The inferior septum, entire inferior wall, apical  anterior segment, and apex are hypokinetic.   2. Right ventricular systolic function is mildly reduced. The right  ventricular size is mildly enlarged. Tricuspid regurgitation signal is  inadequate for assessing PA pressure.   3. The mitral valve is normal in structure. Trivial mitral valve  regurgitation. No evidence of mitral stenosis.   4. The aortic valve is tricuspid. Aortic valve regurgitation is not  visualized. No aortic stenosis is present.   5. The inferior vena cava is normal in size with greater than 50%  respiratory variability, suggesting right atrial pressure of 3 mmHg.   Conclusion(s)/Recommendation(s): No left ventricular mural or apical  thrombus/thrombi.   FINDINGS   Left Ventricle: Left ventricular ejection fraction, by estimation, is 30  to 35%. The left ventricle has moderately decreased function. The left  ventricle demonstrates regional wall motion abnormalities. Definity   contrast agent was given IV to delineate  the left ventricular endocardial borders. The left ventricular internal  cavity size was normal in size. There is no left ventricular hypertrophy.  Left ventricular diastolic parameters are consistent with Grade II  diastolic dysfunction  (pseudonormalization).     LV Wall Scoring:  The inferior septum, entire inferior wall, apical anterior segment, and  apex  are hypokinetic. The inferior septum, entire inferior wall, apical  anterior  segment, and apex are hypokinetic.   Right Ventricle: The right ventricular size is mildly enlarged. Right  vetricular wall thickness was not well visualized. Right ventricular  systolic  function is mildly reduced. Tricuspid regurgitation signal is  inadequate for assessing PA pressure.   Left Atrium: Left atrial size was normal in size.   Right Atrium: Right atrial size was normal in size.   Pericardium: Trivial pericardial effusion is present.   Mitral Valve: The mitral valve is normal in structure. Trivial mitral  valve regurgitation. No evidence of mitral valve stenosis.   Tricuspid Valve: The tricuspid valve is normal in structure. Tricuspid  valve regurgitation is trivial. No evidence of tricuspid stenosis.   Aortic Valve: The aortic valve is tricuspid. Aortic valve regurgitation is  not visualized. No aortic stenosis is present. Aortic valve mean gradient  measures 7.0 mmHg. Aortic valve peak gradient measures 12.4 mmHg. Aortic  valve area, by VTI measures 2.76   cm.   Pulmonic Valve: The pulmonic valve was not well visualized. Pulmonic valve  regurgitation is not visualized. No evidence of pulmonic stenosis.   Aorta: The aortic root and ascending aorta are structurally normal, with  no evidence of dilitation.   Venous: The inferior vena cava is normal in size with greater than 50%  respiratory variability, suggesting right atrial pressure of 3 mmHg.   IAS/Shunts: The interatrial septum was not well visualized.   LHC 10/24/2023    Prox RCA  lesion is 30% stenosed.   2nd Mrg lesion is 40% stenosed.   Prox LAD to Mid LAD lesion is 30% stenosed.   Prox Cx to Dist Cx lesion is 20% stenosed.   1.  Mild nonobstructive coronary artery disease. 2.  Left ventricular angiography was not performed due to chronic kidney disease.  EF was moderately reduced by echo. 3.  Right heart catheterization showed normal right and left-sided filling pressures, mild pulmonary hypertension and normal cardiac output   RA: 8 mmHg, RV: 39/2 mmHg, PW: 10 mmHg, PA: 39/10 mmHg.  Cardiac output is 5.63 with an index of 2.82.   Recommendations: No clear culprit is identified for  elevated troponin which is likely due to supply demand ischemia.  Possible underlying arrhythmia. Heparin  drip can be resumed 2 hours after TR band removal and a DOAC can be started tomorrow if needed. Only 15 mL of contrast was used for the procedure.   Cardiac MRI 12/13/2023   INDINGS: Left ventricle:   -Mild dilatation   -Mild hypertrophy   -Normal systolic function   -Mild ECV elevation (29%)   -No LGE   LV EF: 60% (Normal 49-79%)   Absolute volumes:   LV EDV: (Normal 95-215 mL)   LV ESV: 89mL (Normal 25-85 mL)   LV SV: (Normal 61-145 mL)   CO: 5.6L/min (Normal 3.4-7.8 L/min)   Indexed volumes:   LV EDV: 119mL/sq-m (Normal 50-108 mL/sq-m)   LV ESV: 43mL/sq-m (Normal 11-47 mL/sq-m)   LV SV: 18mL/sq-m (Normal 33-72 mL/sq-m)   CI: 2.8L/min/sq-m (Normal 1.8-4.2 L/min/sq-m)   Right ventricle: Normal size and systolic function   RV EF:  64% (Normal 51-80%)   Absolute volumes:   RV EDV: (Normal 109-217 mL)   RV ESV: 72mL (Normal 23-91 mL)   RV SV: (Normal 71-141 mL)   CO: 5.4L/min (Normal 2.8-8.8 L/min)   Indexed volumes:   RV EDV: 114mL/sq-m (Normal 58-109 mL/sq-m)   RV ESV: 68mL/sq-m (Normal 12-46 mL/sq-m)   RV SV: 33mL/sq-m (Normal 38-71 mL/sq-m)   CI: 2.7L/min/sq-m (Normal 1.7-4.2 L/min/sq-m)   Left atrium: Mild enlargement   Right atrium: Mild enlargement   Mitral valve: Trivial regurgitation   Aortic valve: Tricuspid.  Trivial regurgitation   Tricuspid valve: Mild regurgitation   Pulmonic valve: No regurgitation   Aorta: Normal proximal ascending aorta   Pericardium: Normal   Extracardiac structures: Right lung nodule not well visualized, recommend CT chest for further evaluation   IMPRESSION: 1. Mild LV dilatation, mild hypertrophy, and normal systolic function (EF 60%)   2.  Normal RV size and systolic function (EF 64%)   3.  No late gadolinium enhancement to suggest myocardial scar   4. Right  lung nodule not well visualized, recommend CT chest for further evaluation     Electronically Signed   By: Lonni Nanas M.D.   On: 12/14/2023 15:27   Assessment & Plan   1.  Dizziness-today he feels somewhat improved.  He denies lightheadedness, presyncope and syncope.  He was seen by his PCP who felt that his dizziness may be related to eustachian tube disorder. Maintain p.o. hydration Follow-up with ENT as planned  HFrEF-euvolemic.  Again he continues with daily exercise routine.  Denies heart failure type symptoms.  Weight today 182.  EF 50-55% 2019.  Echocardiogram 10/22/2023 showed an LVEF of 30-35%, G2 DD, trivial mitral valve regurgitation.  Reduced EF may have been related to tachycardia mediated atrial flutter during 10/21/2023 to 10/25/2023 admission.  Cardiac  MRI showed LVEF of 60% and no LGE.  Follow-up BMP on 02/28/2024 showed creatinine of 2.83 and his losartan  was discontinued.  His furosemide  was also decreased.  He was encouraged to increase his p.o. hydration. Continue furosemide , BiDil ,  Heart healthy low-sodium diet Change furosemide  to 20 Monday, Wednesday and Friday  Daily weights Repeat BMP in 1-2 weeks   Bradycardia-heart rate today 45 bmp.  He was previously seen and evaluated by EP.  His symptoms felt to be related to blood pressure and not related to heart rate.  Cardiac MRI showed EF of 60% and no LGE.  Not a candidate for beta-blocker therapy Cardiac event monitor in progress Continue to monitor  Essential hypertension-BP today 144/62. Bp at home in the 120's -130's systolic Maintain blood pressure log Continue isosorbide , hydralazine ,  doxazosin, hydrochlorothiazide  Heart healthy low-sodium diet Ordered BMP today and in 1 week  Coronary artery disease-denies exertional chest discomfort.  With cardiac catheterization 10/24/2023 which showed mild nonobstructive CAD Heart high-fiber diet Continue atorvastatin   Atrial flutter-heart rate today 45 bpm.   Not a candidate for beta-blocker therapy.  Creatinine  2.83 on 02/28/24.  (Patient is 68, weight greater than 60 kg).  On Eliquis .  On appropriate dose.  He reports compliance with this.  He denies bleeding issues and recent trauma. Continue amiodarone , Eliquis  Await cardiac event monitor w-sodium diet Daily weights      Disposition: Follow-up with Dr. Anner or me in 4-6 months.  Josefa HERO. Alexandr Yaworski NP-C     03/17/2024, 3:32 PM [facility] [address] Office [phone] Fax [phone]    I spent 14 minutes examining this patient, reviewing medications, and using patient centered shared decision making involving their cardiac care.   I spent  20 minutes reviewing past medical history,  medications, and prior cardiac tests.

## 2024-03-17 ENCOUNTER — Encounter: Payer: Self-pay | Admitting: General Practice

## 2024-03-17 ENCOUNTER — Ambulatory Visit: Attending: General Practice | Admitting: General Practice

## 2024-03-17 VITALS — BP 144/62 | HR 45 | Ht 72.0 in | Wt 182.4 lb

## 2024-03-17 DIAGNOSIS — R001 Bradycardia, unspecified: Secondary | ICD-10-CM

## 2024-03-17 DIAGNOSIS — I10 Essential (primary) hypertension: Secondary | ICD-10-CM

## 2024-03-17 DIAGNOSIS — I2510 Atherosclerotic heart disease of native coronary artery without angina pectoris: Secondary | ICD-10-CM

## 2024-03-17 DIAGNOSIS — R42 Dizziness and giddiness: Secondary | ICD-10-CM | POA: Diagnosis not present

## 2024-03-17 DIAGNOSIS — I5020 Unspecified systolic (congestive) heart failure: Secondary | ICD-10-CM | POA: Diagnosis not present

## 2024-03-17 NOTE — Patient Instructions (Addendum)
 Medication Instructions:  Reduce your furosemide  to 20 mg every other day *If you need a refill on your cardiac medications before your next appointment, please call your pharmacy*  Lab Work: BMP in 1-2 weeks If you have labs (blood work) drawn today and your tests are completely normal, you will receive your results only by: MyChart Message (if you have MyChart) OR A paper copy in the mail If you have any lab test that is abnormal or we need to change your treatment, we will call you to review the results.  Testing/Procedures: No testing planned  Follow-Up: At [facility], you and your health needs are our priority.  As part of our continuing mission to provide you with exceptional heart care, our providers are all part of one team.  This team includes your primary Cardiologist (physician) and Advanced Practice Providers or APPs (Physician Assistants and Nurse Practitioners) who all work together to provide you with the care you need, when you need it.  Your next appointment:   Follow-up in 4-6 months with Dr. Anner or Josefa Beauvais.    We recommend signing up for the patient portal called MyChart.  Sign up information is provided on this After Visit Summary.  MyChart is used to connect with patients for Virtual Visits (Telemedicine).  Patients are able to view lab/test results, encounter notes, upcoming appointments, etc.  Non-urgent messages can be sent to your provider as well.   To learn more about what you can do with MyChart, go to [email or web address].   Other Instructions Continue to monitor your blood pressure, weights daily, and monitor your fluid intake.

## 2024-04-24 ENCOUNTER — Encounter (INDEPENDENT_AMBULATORY_CARE_PROVIDER_SITE_OTHER): Admitting: Ophthalmology

## 2024-04-24 DIAGNOSIS — H35033 Hypertensive retinopathy, bilateral: Secondary | ICD-10-CM

## 2024-04-24 DIAGNOSIS — I10 Essential (primary) hypertension: Secondary | ICD-10-CM | POA: Diagnosis not present

## 2024-04-24 DIAGNOSIS — H43813 Vitreous degeneration, bilateral: Secondary | ICD-10-CM

## 2024-04-24 DIAGNOSIS — H3562 Retinal hemorrhage, left eye: Secondary | ICD-10-CM | POA: Diagnosis not present

## 2024-05-12 ENCOUNTER — Telehealth: Payer: Self-pay | Admitting: Cardiology

## 2024-05-12 NOTE — Telephone Encounter (Signed)
   Cardiac Monitor Alert  Date of alert:  05/12/2024 (I-Rhythm notified triage on 05/12/24 at 1:30pm)  Patient Name: Terry Moss  DOB: [birth year]  MRN: [id number]   [facility] Cardiologist: [name], MD  [facility] EP:  [name], MD    Monitor Information: Long Term Monitor [ZioXT]  Reason:  Bradycardia Ordering provider:  Dr. [name] (after Office Visit on 02/04/24)   Alert Bradycardia - slowest HR: Symptomatic Bradycardia on TWO different days:  First: 03/29/24 at 10:57 pm, HR=39 bpm for 30 sec.(Pg 8-9/Strip 8) Second: 03/30/24 at 7:44 pm, HR 37 bpm for 30 sec (pg 10/Strip 11)  This is the 1st alert for this rhythm.   Next Cardiology Appointment :1}  Date:  06/15/2024  Provider:  Dr. Clay  Plan:  Will route to MD who will be reading report.

## 2024-05-12 NOTE — Telephone Encounter (Signed)
 iRhythm calling with abnormal Zio results. Please advise.   Callback #: [id number] Reference #: [id number]

## 2024-05-13 DIAGNOSIS — I428 Other cardiomyopathies: Secondary | ICD-10-CM

## 2024-05-13 DIAGNOSIS — R001 Bradycardia, unspecified: Secondary | ICD-10-CM

## 2024-05-13 DIAGNOSIS — I4892 Unspecified atrial flutter: Secondary | ICD-10-CM | POA: Diagnosis not present

## 2024-05-13 DIAGNOSIS — I130 Hypertensive heart and chronic kidney disease with heart failure and stage 1 through stage 4 chronic kidney disease, or unspecified chronic kidney disease: Secondary | ICD-10-CM | POA: Diagnosis not present

## 2024-05-13 DIAGNOSIS — I1A0 Resistant hypertension: Secondary | ICD-10-CM | POA: Diagnosis not present

## 2024-05-13 DIAGNOSIS — I5032 Chronic diastolic (congestive) heart failure: Secondary | ICD-10-CM

## 2024-05-13 NOTE — Telephone Encounter (Signed)
 Patient has an appt. Schedule for 06/15/24 with Dr Anner.

## 2024-05-13 NOTE — Telephone Encounter (Signed)
 Does he have another monitor on.  He had a monitor back in September.  I actually just read that result and based on that I think we probably ought to have him stop amiodarone . I thought the results of been reviewed when he went and saw Josefa in clinic but I guess they had not been (I had not done the formal read on that study).  I actually just called and talked to him.  He is not currently wearing another monitor.  Interestingly the eye rhythm notification was from 2 months ago.  He does have slower heart rates so I recommended that he stops his amiodarone .  He will monitor heart rates and if his heart rate goes fast all of a sudden and he is noticing shortness of breath or chest discomfort, he should take 2 amiodarone  pills that day and reassess if still going fast the next day he should notify us .  I could not see when he was scheduled to be seen in follow-up but probably would not be a bad idea for him to come in to see either me or Josefa early next year.  (Late January early February)  [facility]

## 2024-05-14 ENCOUNTER — Ambulatory Visit: Payer: Self-pay | Admitting: Cardiology

## 2024-06-15 ENCOUNTER — Encounter: Payer: Self-pay | Admitting: Cardiology

## 2024-06-15 ENCOUNTER — Ambulatory Visit: Attending: Cardiology | Admitting: Cardiology

## 2024-06-15 VITALS — BP 160/74 | HR 49 | Ht 72.0 in | Wt 181.0 lb

## 2024-06-15 DIAGNOSIS — I5032 Chronic diastolic (congestive) heart failure: Secondary | ICD-10-CM | POA: Diagnosis not present

## 2024-06-15 DIAGNOSIS — I428 Other cardiomyopathies: Secondary | ICD-10-CM | POA: Diagnosis not present

## 2024-06-15 DIAGNOSIS — I4892 Unspecified atrial flutter: Secondary | ICD-10-CM | POA: Diagnosis not present

## 2024-06-15 DIAGNOSIS — R001 Bradycardia, unspecified: Secondary | ICD-10-CM

## 2024-06-15 DIAGNOSIS — I130 Hypertensive heart and chronic kidney disease with heart failure and stage 1 through stage 4 chronic kidney disease, or unspecified chronic kidney disease: Secondary | ICD-10-CM | POA: Diagnosis not present

## 2024-06-15 DIAGNOSIS — I1A0 Resistant hypertension: Secondary | ICD-10-CM | POA: Diagnosis not present

## 2024-06-15 DIAGNOSIS — D6869 Other thrombophilia: Secondary | ICD-10-CM

## 2024-06-15 DIAGNOSIS — N184 Chronic kidney disease, stage 4 (severe): Secondary | ICD-10-CM

## 2024-06-15 DIAGNOSIS — I483 Typical atrial flutter: Secondary | ICD-10-CM | POA: Diagnosis not present

## 2024-06-15 DIAGNOSIS — Z01810 Encounter for preprocedural cardiovascular examination: Secondary | ICD-10-CM | POA: Diagnosis not present

## 2024-06-15 NOTE — Progress Notes (Unsigned)
 Cardiology Office Note:  .   Date:  06/15/2024  ID:  Terry JONELLE Munroe Sr., DOB 04-23-1956, MRN [id number] PCP: Arloa Elsie JONELLE, MD  [facility] Providers Cardiologist:  [name], MD Electrophysiologist:  [name], MD { Click to update primary MD,subspecialty MD or APP then REFRESH:1}    No chief complaint on file.   Patient Profile: .     Terry R Gilkison Sr. is a *** 68 y.o. male *** with a PMH notable for *** who presents here for *** at the request of Arloa Elsie JONELLE, MD.  Terry JONELLE Munroe Sr. is a 68 y.o. male with a PMH notable for mild to be hypertensive heart and renal disease (NICM, CKD stage IV with new diagnosis of atrial flutter) reviewed below who presents here for 69-month follow-up.   Referred provider: Arloa Elsie JONELLE, MD.; former Cardiologist Dr. Aleene Passe  Nonischemic Cardiomyopathy:-By chart lore - listed as that CHF Myoview  2009: EF 40% Echo 2016 EF 25 to 30%-normalized by July 2019-EF to 55% Echo April 2025: EF 35% with inferior hypokinesis.  Cardiac cath with nonobstructive CAD. New Diagnosis of Atrial Flutter History of PVCs/NSVT Resistant HTN CKD Stage IV. Prostate cancer  Terry Moss's last seen by Dr. Passe on September 27, 2023.  Noted is very active.  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  He is 1 of 12 siblings and only he and his sister do not have a pacemaker.  They planned to check a cardiac MRI to reassess his cardiac function.   He was seen by Dr. Norton 11/17/2023.  It was felt that he was minimally symptomatic with his bradycardia.  It was felt that his dizziness may have been related to blood pressure changes.  His blood pressure medications (Imdur  and hydralazine  had been increased for further blood pressure control.  He was continued on amiodarone  and Eliquis .       Terry R Bence Sr. was last seen on ***  Subjective  Discussed the use of AI scribe software for clinical note transcription with the patient, who gave  verbal consent to proceed.  History of Present Illness      Cardiovascular ROS: {roscv:310661}  ROS:  Review of Systems - {ros master:310782}    Objective    Studies Reviewed: .          Results  Echocardiogram: LVEF 30-35%, RWMA: Inferior septum, entire inferior wall, anterior apical and apex hypokinesis. GR 2 DD.  Mild enlarged RV unable to measure PAP/RVSP.  Normal AoV and MV.  Normal RAP.  No LV mural or apical thrombus.  (10/2023)  Echo 01/13/2018: EF 50 and 55%.  Moderate LVH.  Moderate LA dilation.  Mild reduction. CATH: Dominance: Right  Prox RCA lesion is 30% stenosed.  2nd Mrg lesion is 40% stenosed.  Prox LAD to Mid LAD lesion is 30% stenosed.  Prox Cx to Dist Cx lesion is 20% stenosed. RHC: Normal pressures except mild pulmonary hypertension and normal cardiac output: RA: 8 mmHg, RV: 39/2 mmHg, PW: 10 mmHg, PA: 39/10 mmHg.  Cardiac output is 5.63 with an index of 2.82.    Cardiac MRI: LVEF ~60%, mildly dilated LV, mild LVH.  Normal RV.  RVEF 64%.  No evidence of scar.  Right lung nodule noted.  (12/2023)  Risk Assessment/Calculations:   {Does this patient have ATRIAL FIBRILLATION?[phone]} The patient's 1st BP is elevated (>139/89)*** Repeat BP and {Click to enter a 2nd BP Refresh Note  :1}  Physical Exam:   VS:  BP (!) 160/74   Pulse (!) 49   Ht 6' (1.829 m)   Wt 181 lb (82.1 kg)   SpO2 95%   BMI 24.55 kg/m    Wt Readings from Last 3 Encounters:  06/15/24 181 lb (82.1 kg)  03/17/24 182 lb 6.4 oz (82.7 kg)  02/28/24 179 lb (81.2 kg)    Physical Exam    GEN: Well nourished, well developed in no acute distress; *** NECK: No JVD; No carotid bruits CARDIAC: Normal S1, S2; RRR, no murmurs, rubs, gallops RESPIRATORY:  Clear to auscultation without rales, wheezing or rhonchi ; nonlabored, good air movement. ABDOMEN: Soft, non-tender, non-distended EXTREMITIES:  No edema; No deformity      ASSESSMENT AND PLAN: .    Problem List Items  Addressed This Visit   None   Assessment and Plan Assessment & Plan        {Are you ordering a CV Procedure (e.g. stress test, cath, DCCV, TEE, etc)?   Press F2        :[id number]}   Follow-Up: No follow-ups on file.  I spent *** minutes in the care of Kamarii R Hampe Sr. today including {CHL AMB CAR Time Based Billing Options STW (Optional):[phone]::documenting in the encounter.}      Signed, Alm MICAEL Clay, MD, MS [name], M.D., M.S. Interventional Cardiologist  [facility] Pager # [phone]

## 2024-06-15 NOTE — Patient Instructions (Signed)
 Medication Instructions:    Discuss with your Kidney doctor about your medication  *If you need a refill on your cardiac medications before your next appointment, please call your pharmacy*   Lab Work: Not needed If you have labs (blood work) drawn today and your tests are completely normal, you will receive your results only by: MyChart Message (if you have MyChart) OR A paper copy in the mail If you have any lab test that is abnormal or we need to change your treatment, we will call you to review the results.   Testing/Procedures:  Not needed  Follow-Up: At [facility], you and your health needs are our priority.  As part of our continuing mission to provide you with exceptional heart care, we have created designated Provider Care Teams.  These Care Teams include your primary Cardiologist (physician) and Advanced Practice Providers (APPs -  Physician Assistants and Nurse Practitioners) who all work together to provide you with the care you need, when you need it.     Your next appointment:   6 month(s)  The format for your next appointment:   In Person  Provider:   [name], MD

## 2024-06-18 ENCOUNTER — Telehealth: Payer: Self-pay | Admitting: Cardiology

## 2024-06-18 NOTE — Telephone Encounter (Signed)
° °  Pre-operative Risk Assessment    Patient Name: Terry KALEY Sr.  DOB: [birth year] MRN: [id number]      Request for Surgical Clearance    Procedure:  Insertion of male sling   Date of Surgery:  Clearance 08/11/24                                 Surgeon:  Dr. Arlyss Foot Surgeon's Group or Practice Name:  [facility]  Phone number:  [phone] Fax number:  [phone]   Type of Clearance Requested:   - Medical    Type of Anesthesia:  General    Additional requests/questions:    SignedRojelio Kays   06/18/2024, 12:34 PM

## 2024-06-19 ENCOUNTER — Encounter: Payer: Self-pay | Admitting: Cardiology

## 2024-06-19 ENCOUNTER — Other Ambulatory Visit: Payer: Self-pay | Admitting: Urology

## 2024-06-19 DIAGNOSIS — I483 Typical atrial flutter: Secondary | ICD-10-CM | POA: Insufficient documentation

## 2024-06-19 DIAGNOSIS — Z01810 Encounter for preprocedural cardiovascular examination: Secondary | ICD-10-CM | POA: Insufficient documentation

## 2024-06-19 NOTE — Assessment & Plan Note (Signed)
 No recent palpitations or arrhythmias. Heart rate well-managed.  Amiodarone  discontinued due to bradycardia-can use as needed for breakthrough spells..  Apixaban  for anticoagulation. - Continue apixaban  5 mg twice a day.

## 2024-06-19 NOTE — Assessment & Plan Note (Signed)
 Remains on Eliquis  5 mg twice daily for atrial flutter.  Thankfully no breakthrough spells.  Has been off amiodarone  now for couple months.  Okay to hold Eliquis  2 to 3 days preop for procedures or surgeries.

## 2024-06-19 NOTE — Assessment & Plan Note (Signed)
 Patient has no active cardiac symptoms besides hypertension.  His EF is improved back to baseline.  Minimal coronary artery disease.  No angina. He is on Eliquis  for atrial flutter which can be held 2 to 3 days preop for surgeries or procedures.  Since he has had a recent ischemic evaluation and cardiac MRI, no further testing required.  He is able to achieve close to 8 METS.     Mr. Lacomb perioperative risk of a major cardiac event is 0.9% according to the Revised Cardiac Risk Index (RCRI).  Therefore, he is at low risk for perioperative complications.   His functional capacity is excellent at 9.89 METs according to the Duke Activity Status Index (DASI). Recommendations: According to ACC/AHA guidelines, no further cardiovascular testing needed.  The patient may proceed to surgery at acceptable risk.   Antiplatelet and/or Anticoagulation Recommendations: He is not on any antiplatelet agents. Eliquis  (Apixaban ) can be held for 2-3 days days prior to surgery.  Please resume post op when felt to be safe.

## 2024-06-19 NOTE — Assessment & Plan Note (Signed)
 Medications: Limited by the fact that he has significant bradycardia and CKD. Current medications listed are only doxazosin 3 mg daily, BiDil  1 tablet 3 times daily.  Amlodipine  no longer listed.  Renal insufficiency makes it difficult to consider using ARB/MRA/ARNI. Significant bradycardia makes the use of beta-blocker such as carvedilol  less favorable also specially with his renal insufficiency. At this point I think I would defer to nephrology to manage his pressures.  Too confusing to have too many people controlling medications

## 2024-06-19 NOTE — Assessment & Plan Note (Signed)
-   Continue furosemide  20 mg four times a week. - Continue doxazosin 3 mg daily. - Continue isosorbide -hydralazine  (Bidil ) 20/37.5 mg three times a day. => Need to clarify whether he is taking 1 or 2 tablets each time - Coordinate with nephrologist for blood pressure management.  (To many providers adjusting medications.) - Monitor blood pressure readings and report to nephrologist. - Consider reintroducing amlodipine  if blood pressure remains elevated.   Both losartan  and HCTZ have been discontinued because of complaints of dizziness

## 2024-06-19 NOTE — Assessment & Plan Note (Addendum)
 He does have intermittent episodes of low EF with most recent episode being in the setting of demand ischemia and atrial flutter.  Thankfully EF is back to normal by cardiac MRI. Cardiac MRI shows normal left ventricular function with LVEF improved back to 60%. Right ventricular function normal. Blood pressure management complicated by kidney function. No heart failure symptoms. Blood pressure variable. Continue to treat blood pressure and atrial flutter.

## 2024-06-19 NOTE — Assessment & Plan Note (Signed)
 Probably has a component of sick sinus syndrome. -No longer on beta-blocker or amiodarone  due to bradycardia.  If you were to have more breakthrough spells of atrial flutter requiring rate/rhythm control, would probably benefit from PPM which will then allow us  to use beta-blocker for blood pressure as well.

## 2024-06-19 NOTE — Telephone Encounter (Signed)
 Note just done.  I addressed cardiovascular risk.  Low risk. Okay to hold Eliquis  2 to 3 days preop  Monitor for arrhythmias.   [name], MD

## 2024-06-22 ENCOUNTER — Other Ambulatory Visit: Payer: Self-pay | Admitting: Urology

## 2024-08-11 ENCOUNTER — Ambulatory Visit (HOSPITAL_COMMUNITY): Admit: 2024-08-11 | Admitting: Urology

## 2024-08-11 SURGERY — CREATION, URETHRAL SLING, MALE
Anesthesia: General

## 2024-10-22 ENCOUNTER — Encounter (INDEPENDENT_AMBULATORY_CARE_PROVIDER_SITE_OTHER): Admitting: Ophthalmology
